# Patient Record
Sex: Female | Born: 1965 | Race: White | Hispanic: No | Marital: Married | Smoking: Never smoker
Health system: Southern US, Community
[De-identification: ages and names within clinical notes are randomized; demographics above are authoritative.]

## PROBLEM LIST (undated history)

## (undated) DIAGNOSIS — M1990 Unspecified osteoarthritis, unspecified site: Secondary | ICD-10-CM

## (undated) DIAGNOSIS — K219 Gastro-esophageal reflux disease without esophagitis: Secondary | ICD-10-CM

## (undated) DIAGNOSIS — M810 Age-related osteoporosis without current pathological fracture: Secondary | ICD-10-CM

## (undated) DIAGNOSIS — K589 Irritable bowel syndrome without diarrhea: Secondary | ICD-10-CM

## (undated) DIAGNOSIS — E079 Disorder of thyroid, unspecified: Secondary | ICD-10-CM

## (undated) DIAGNOSIS — T7840XA Allergy, unspecified, initial encounter: Secondary | ICD-10-CM

## (undated) HISTORY — DX: Allergy, unspecified, initial encounter: T78.40XA

## (undated) HISTORY — DX: Unspecified osteoarthritis, unspecified site: M19.90

## (undated) HISTORY — DX: Gastro-esophageal reflux disease without esophagitis: K21.9

## (undated) HISTORY — PX: COLONOSCOPY: SHX174

## (undated) HISTORY — DX: Disorder of thyroid, unspecified: E07.9

## (undated) HISTORY — DX: Age-related osteoporosis without current pathological fracture: M81.0

## (undated) HISTORY — PX: AUGMENTATION MAMMAPLASTY: SUR837

## (undated) HISTORY — DX: Irritable bowel syndrome, unspecified: K58.9

## (undated) HISTORY — PX: UPPER GASTROINTESTINAL ENDOSCOPY: SHX188

---

## 1998-11-13 ENCOUNTER — Other Ambulatory Visit: Admission: RE | Admit: 1998-11-13 | Discharge: 1998-11-13 | Payer: Self-pay | Admitting: Obstetrics and Gynecology

## 1999-09-15 ENCOUNTER — Other Ambulatory Visit: Admission: RE | Admit: 1999-09-15 | Discharge: 1999-09-15 | Payer: Self-pay | Admitting: Obstetrics and Gynecology

## 1999-10-16 ENCOUNTER — Ambulatory Visit (HOSPITAL_COMMUNITY): Admission: AD | Admit: 1999-10-16 | Discharge: 1999-10-16 | Payer: Self-pay | Admitting: Obstetrics and Gynecology

## 1999-10-16 ENCOUNTER — Encounter (INDEPENDENT_AMBULATORY_CARE_PROVIDER_SITE_OTHER): Payer: Self-pay

## 2000-11-03 ENCOUNTER — Other Ambulatory Visit: Admission: RE | Admit: 2000-11-03 | Discharge: 2000-11-03 | Payer: Self-pay | Admitting: Obstetrics and Gynecology

## 2001-10-11 HISTORY — PX: KNEE SURGERY: SHX244

## 2002-03-12 ENCOUNTER — Other Ambulatory Visit: Admission: RE | Admit: 2002-03-12 | Discharge: 2002-03-12 | Payer: Self-pay | Admitting: Obstetrics and Gynecology

## 2003-06-24 ENCOUNTER — Inpatient Hospital Stay (HOSPITAL_COMMUNITY): Admission: AD | Admit: 2003-06-24 | Discharge: 2003-06-28 | Payer: Self-pay | Admitting: Obstetrics and Gynecology

## 2003-06-29 ENCOUNTER — Encounter: Admission: RE | Admit: 2003-06-29 | Discharge: 2003-07-29 | Payer: Self-pay | Admitting: Obstetrics and Gynecology

## 2003-07-29 ENCOUNTER — Other Ambulatory Visit: Admission: RE | Admit: 2003-07-29 | Discharge: 2003-07-29 | Payer: Self-pay | Admitting: Obstetrics & Gynecology

## 2004-08-12 ENCOUNTER — Other Ambulatory Visit: Admission: RE | Admit: 2004-08-12 | Discharge: 2004-08-12 | Payer: Self-pay | Admitting: Obstetrics and Gynecology

## 2004-11-11 HISTORY — PX: AUGMENTATION MAMMAPLASTY: SUR837

## 2005-08-16 ENCOUNTER — Ambulatory Visit: Payer: Self-pay | Admitting: Family Medicine

## 2005-09-27 ENCOUNTER — Other Ambulatory Visit: Admission: RE | Admit: 2005-09-27 | Discharge: 2005-09-27 | Payer: Self-pay | Admitting: Obstetrics and Gynecology

## 2005-11-09 ENCOUNTER — Ambulatory Visit: Payer: Self-pay | Admitting: Internal Medicine

## 2008-03-15 ENCOUNTER — Encounter: Payer: Self-pay | Admitting: Internal Medicine

## 2008-04-10 ENCOUNTER — Ambulatory Visit: Payer: Self-pay | Admitting: Internal Medicine

## 2008-04-10 DIAGNOSIS — R1013 Epigastric pain: Secondary | ICD-10-CM | POA: Insufficient documentation

## 2008-04-10 DIAGNOSIS — K219 Gastro-esophageal reflux disease without esophagitis: Secondary | ICD-10-CM | POA: Insufficient documentation

## 2008-04-26 ENCOUNTER — Encounter: Payer: Self-pay | Admitting: Internal Medicine

## 2008-04-26 ENCOUNTER — Ambulatory Visit: Payer: Self-pay | Admitting: Internal Medicine

## 2008-05-02 ENCOUNTER — Encounter: Payer: Self-pay | Admitting: Internal Medicine

## 2008-10-11 HISTORY — PX: SHOULDER SURGERY: SHX246

## 2008-10-30 ENCOUNTER — Encounter: Admission: RE | Admit: 2008-10-30 | Discharge: 2008-10-30 | Payer: Self-pay | Admitting: Obstetrics and Gynecology

## 2009-01-02 ENCOUNTER — Encounter: Admission: RE | Admit: 2009-01-02 | Discharge: 2009-04-02 | Payer: Self-pay | Admitting: Orthopedic Surgery

## 2009-04-03 ENCOUNTER — Encounter: Admission: RE | Admit: 2009-04-03 | Discharge: 2009-04-21 | Payer: Self-pay | Admitting: Orthopedic Surgery

## 2009-11-18 ENCOUNTER — Encounter: Admission: RE | Admit: 2009-11-18 | Discharge: 2009-11-18 | Payer: Self-pay | Admitting: Obstetrics and Gynecology

## 2010-04-20 ENCOUNTER — Encounter (INDEPENDENT_AMBULATORY_CARE_PROVIDER_SITE_OTHER): Payer: Self-pay | Admitting: Family Medicine

## 2010-04-20 ENCOUNTER — Ambulatory Visit (HOSPITAL_COMMUNITY): Admission: RE | Admit: 2010-04-20 | Discharge: 2010-04-20 | Payer: Self-pay | Admitting: Family Medicine

## 2010-04-20 ENCOUNTER — Ambulatory Visit: Payer: Self-pay | Admitting: Cardiology

## 2010-04-20 ENCOUNTER — Ambulatory Visit: Payer: Self-pay

## 2010-06-29 ENCOUNTER — Encounter
Admission: RE | Admit: 2010-06-29 | Discharge: 2010-08-05 | Payer: Self-pay | Source: Home / Self Care | Admitting: Sports Medicine

## 2010-11-02 ENCOUNTER — Other Ambulatory Visit: Payer: Self-pay | Admitting: Obstetrics and Gynecology

## 2010-11-02 DIAGNOSIS — Z1239 Encounter for other screening for malignant neoplasm of breast: Secondary | ICD-10-CM

## 2010-11-02 DIAGNOSIS — Z1231 Encounter for screening mammogram for malignant neoplasm of breast: Secondary | ICD-10-CM

## 2010-11-11 LAB — HM MAMMOGRAPHY

## 2010-11-12 NOTE — Consult Note (Signed)
Summary: GERD, EPIGASTRIC PAIN   History of Present Illness Visit Type: consult Primary GI MD: Stan Head MD [facility] Primary Provider: Candis Schatz Requesting Provider: Candis Schatz Chief Complaint: GERD also having Epigastic pain History of Present Illness:   45 year-old white woman with at least several year history of intermittent epigastric pain and abdominal distention which occurs in the setting of chronic Nexium use to control GERD. The Nexium works well.  A few years ago she was having some abdominal pain and diarrhea, Levsin SL was tried without much success but those symptoms resolved spontaneously. She now describes spells of disabling upper abdominal pain associated with abdominal bloating and distention, then after 1-6 hours gets relief. Sometimes it dissipates without any changes and sometimes there is a fair amount of flatus. No nausea or vomiting to speak of. The spells occur 2-4x a month. SHe cannot pinpoint any particular foods but thinks sugar alcohols like sorbitol cause trouble.  Problems atre typically daytime troubles and not nocturnal.  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 Her 51/2 yo daughter is a source of stress also. Her problems started before C section in 2004.  Dr. Timothy Lasso has checked celiac Ab's and are negative, recent abdominal ultrasound is normal, TSH and T4 are normal. Records reviewed.   All other GI ROS negative.            [place] of Abdomen  Procedure date:  04/04/2008  Findings:      normal:   [facility]     Prior Medications Reviewed Using: Medication Bottles  Updated Prior Medication List: PROPYLTHIOURACIL 50 MG  TABS (PROPYLTHIOURACIL) 1/2 every other day NEXIUM 40 MG  PACK (ESOMEPRAZOLE MAGNESIUM) 1 capsule every day ZOVIA 1/35E (28) 1-35 MG-MCG  TABS (ETHYNODIOL DIAC-ETH ESTRADIOL) 1 tablet by mouth every day  Current Allergies: No known allergies   Past Medical  History:    IBS    Allergic Rhinitis    GERD    Hyperthyroidism  Past Surgical History:    Cesarean Section 2004   Family History:    Family History of Colon Cancer: maternal randfather    Family History of Diabetes: grandfather, mother, father    Family History of Heart Disease: father (deceased)  Social History:    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     Daily Caffeine Use 0-1/day    Illicit Drug Use - no    1 daughter    Married   Risk Factors:  Tobacco use:  never Drug use:  no   Review of Systems       The patient complains of allergy/sinus.         All other ROS negative   Vital Signs:  Patient Profile:   45 Years Old Female Height:     62 inches Weight:      117 pounds BMI:     21.48 Pulse rate:   64 / minute Pulse rhythm:   regular BP sitting:   90 / 66  (right arm)  Vitals Entered By: Merri Ray CMA (April 10, 2008 2:02 PM)                  Physical Exam  General:     Well developed, well nourished, no acute distress. Head:     Normocephalic and atraumatic. Eyes:     PERRLA, no icterus. Mouth:     No deformity or  lesions, dentition normal. Neck:     Supple; no masses or thyromegaly. Lungs:     Clear throughout to auscultation. Heart:     Regular rate and rhythm; no murmurs, rubs,  or bruits. Abdomen:     Soft, nontender and nondistended. No masses, hepatosplenomegaly or hernias noted. Normal bowel sounds. Extremities:     No clubbing, cyanosis, edema or deformities noted. Neurologic:     Alert and  oriented x4;  grossly normal neurologically. Cervical Nodes:     No significant cervical or supraclavicular adenopathy.  Psych:     Alert and cooperative. Normal mood and affect.    Impression & Recommendations:  Problem # 1:  ABDOMINAL PAIN-EPIGASTRIC (ICD-789.06) Assessment: Comment Only Episodic with disabling effects. Sounds functional, ? if related to Nexium. Started before C-sxn so  doubt adhesions. Celiac Ab neg 4/07 but gluten-intolerance still a possibility. Korea negative and does not sound biliary. ? food or sugar intolerance.  After EGD (if negative) consider antispasmodic, probiotic and different PPI. Could need small bowel evaluaton and will consider duodenal biopsies at EGD (? occult celiac disease).  Orders: EGD (EGD) Risks, benefits,and indications of endoscopic procedure(s) were reviewed with the patient and all questions answered.    Problem # 2:  GERD (ICD-530.81) Assessment: Comment Only Ok on Nexium.   Patient Instructions: 1)  [facility] Patient Information Guide given to patient.    ]

## 2010-11-12 NOTE — Letter (Signed)
Summary: Patient [facility] Biopsy Results  [facility]  [address] [place], [place] [address]   Phone: [phone]  Fax: [phone]        May 02, 2008 MRN: [id number]    Melanie Taylor [address] [place], [place]  [address]    Dear Ms. Marcou,  I am pleased to inform you that the biopsies taken during your recent endoscopic examination demonstrated that you have some benign stomach polyps that do not need follow-up. I do not think they are related to your symptoms.  Please continue with the treatment plan as outlined on the day of your    exam.   Please call us if you are having persistent problems or have questions about your condition that have not been fully answered at this time.  Sincerely,  Iva Boop MD  This letter has been electronically signed by your physician.

## 2010-11-12 NOTE — Procedures (Signed)
Summary: EGD   EGD  Procedure date:  04/26/2008  Findings:      Location: [facility]    Patient Name: Melanie Taylor, Melanie Taylor MRN: [id number] Procedure Procedures: Panendoscopy (EGD) CPT: 43235.  Personnel: Endoscopist: Iva Boop, MD, [facility].  Referred By: Creola Corn, MD.  Exam Location: Exam performed in Outpatient Clinic. Outpatient  Patient Consent: Procedure, Alternatives, Risks and Benefits discussed, consent obtained, from patient. Consent was obtained by the RN.  Indications Symptoms: Abdominal pain, location: epigastric.  Comments: INTERMITTENT, DISABLING EPIGASTRIC PAIN. ABD Korea NORMAL, CELIAC ABS NEG, IS ON NEXIUM History  Current Medications: Patient is not currently taking Coumadin.  Allergies: No known allergies.  Pre-Exam Physical: Performed Apr 26, 2008  Cardio-pulmonary exam, HEENT exam, Abdominal exam, Mental status exam WNL.  Comments: Pt. history reviewed/updated, physical exam performed prior to initiation of sedation? YES Exam Exam Info: Maximum depth of insertion Duodenum, intended Duodenum. Patient position: on left side. Gastric retroflexion performed. ASA Classification: II. Tolerance: excellent.  Sedation Meds: Patient assessed and found to be appropriate for moderate (conscious) sedation. Fentanyl 50 mcg. given IV. Versed 5 mg. given IV. Cetacaine Spray 2 sprays given aerosolized.  Monitoring: BP and pulse monitoring done. Oximetry used. Supplemental O2 given  Findings - Normal: Proximal Esophagus to Distal Esophagus. Comments: z-line at 35 cm.  - MUCOSAL ABNORMALITY: Cardia to Body. Biopsy/Mucosal Abn taken. Comment: MULTIPLE SOFT, FLESHY POLYPS UP TO 8 MM IN SIZE.  - Normal: Body to Duodenal 2nd Portion. Comments: DUODENAL MUCOSA OK, MO BXS TAKEN GIVEN NEG CELIAC AB'S.   Assessment  Comments: 1) PROXIMAL GASTRIC POLYPS THAT LOOK LIKE BENIGN FUNDIC GLAND POLYPS 2) OTHERWISE NORMAL EGD  ETIOLOGY OF PAIN SOUNDS MOST  LIKE IBS, ? TRANSIENT OBSTRUCTIONS Events  Unplanned Intervention: No unplanned interventions were required.  Plans Comments: 1) DICYCLOMINE 20 MG EVERY 6 HRS PRN ABDOMINAL PAIN  2) TRY TO KEEP A FOOD DIARY TO SEE WHAT TRIGGERS PAIN  Disposition: After procedure patient sent to recovery. After recovery patient sent home.  Scheduling: Path Letter, to The Patient,   Comments: IF DICYCLOMINE IS UNHELPFUL PLEASE SCHEDULE A RETURN VISIT WITH DR. Leone Payor IF YOU GET ANOTHER ATTACK WITH PAIN AND ABDOMINAL SWELLING PLEASE HAVE AN ABDOMINAL XRAY DONE (RX GIVEN FOR THIS)  cc:  Creola Corn, MD  REPORT OF SURGICAL PATHOLOGY   Case #: [id number] Patient Name: Melanie, [name] Office Chart Number:  [id number]   MRN: [id number] Pathologist: Alden Server A. Delila Spence, MD DOB/Age  04/06/1966 (Age: 66)    Gender: F Date Taken:  04/26/2008 Date Received: 04/29/2008   FINAL DIAGNOSIS   ***MICROSCOPIC EXAMINATION AND DIAGNOSIS***   STOMACH, POLYPS, BIOPSY:   - BENIGN FUNDIC GLAND POLYPS.  - NO ADENOMATOUS CHANGE OR MALIGNANCY IDENTIFIED.    COMMENT A Warthin-Starry stain is performed to determine the possibility of the presence of Helicobacter pylori. The Warthin-Starry stain is negative for organisms of Helicobacter pylori. The control(s) stained appropriately. (EAA:mj 04/30/08)   mj Date Reported:  04/30/2008     Lyn Hollingshead. Delila Spence, MD *** Electronically Signed Out By EAA ***   Clinical information GERD, abd pain   R/O adenoma (mw)   specimen(s) obtained Stomach, polyp(s)   Gross Description Received in formalin are tan, soft tissue fragments that are submitted in toto.  Number:  2            Size:  Each 0.4 cm.    (GP:cdc 04/29/08)            cc/  Signed by Iva Boop MD on 05/02/2008 at 3:26 PM  ________________________________________________________________________ NO RECALL   Signed by Iva Boop MD on 05/02/2008 at 3:31 PM   ________________________________________________________________________   May 02, 2008 MRN: [id number]    [facility] [address] [place], [place]  [address]    Dear Ms. Loberg,  I am pleased to inform you that the biopsies taken during your recent endoscopic examination demonstrated that you have some benign stomach polyps that do not need follow-up. I do not think they are related to your symptoms.  Please continue with the treatment plan as outlined on the day of your    exam.   Please call us if you are having persistent problems or have questions about your condition that have not been fully answered at this time.  Sincerely,  Iva Boop MD  This letter has been electronically signed by your physician.   Signed by Iva Boop MD on 05/02/2008 at 3:33 PM May 02, 2008 MRN: [id number]    Melanie Taylor [address] [place], [place]  [address]    Dear Ms. Westerman,  I am pleased to inform you that the biopsies taken during your recent endoscopic examination demonstrated that you have some benign stomach polyps that do not need follow-up. I do not think they are related to your symptoms.  Please continue with the treatment plan as outlined on the day of your    exam.   Please call us if you are having persistent problems or have questions about your condition that have not been fully answered at this time.  Sincerely,  Iva Boop MD  This letter has been electronically signed by your physician.   Signed by Iva Boop MD on 05/02/2008 at 3:33 PM \  This report was created from the original endoscopy report, which was reviewed and signed by the above listed endoscopist.

## 2010-11-12 NOTE — Progress Notes (Signed)
Summary: ov note/Dr. Nancie Neas note/   Imported By: Lester [place] 04/17/2008 10:05:27  _____________________________________________________________________  External Attachment:    Type:   Image     Comment:   External Document

## 2010-11-12 NOTE — Miscellaneous (Signed)
Summary: dicyclomine rx.  Clinical Lists Changes  Medications: Added new medication of DICYCLOMINE HCL 20 MG  TABS (DICYCLOMINE HCL) dicyclomine 20mg  every 6 hrs as needed abdominal pain. - Signed Rx of DICYCLOMINE HCL 20 MG  TABS (DICYCLOMINE HCL) dicyclomine 20mg  every 6 hrs as needed abdominal pain.;  #60 x 5;  Signed;  Entered by: Darlyn Read RN;  Authorized by: Iva Boop MD;  Method used: Electronic    Prescriptions: DICYCLOMINE HCL 20 MG  TABS (DICYCLOMINE HCL) dicyclomine 20mg  every 6 hrs as needed abdominal pain.  #60 x 5   Entered by:   Darlyn Read RN   Authorized by:   Iva Boop MD   Signed by:   Darlyn Read RN on 04/26/2008   Method used:   Electronically sent to ...       [facility] [phone]*       [address]       [place], [place]  [address]       Ph: [phone]       Fax: [phone]   RxID:   [id number]

## 2010-11-20 ENCOUNTER — Ambulatory Visit
Admission: RE | Admit: 2010-11-20 | Discharge: 2010-11-20 | Disposition: A | Discharge status: Home / Self Care | Payer: BC Managed Care – PPO | Source: Ambulatory Visit | Attending: Obstetrics and Gynecology | Admitting: Obstetrics and Gynecology

## 2010-11-20 DIAGNOSIS — Z1231 Encounter for screening mammogram for malignant neoplasm of breast: Secondary | ICD-10-CM

## 2010-12-15 ENCOUNTER — Other Ambulatory Visit: Payer: Self-pay | Admitting: Obstetrics and Gynecology

## 2011-02-26 NOTE — Op Note (Signed)
[facility]  Patient:    Melanie Taylor                      MRN: [id number] Proc. Date: 10/15/98 Adm. Date:  [id number] Attending:  Mickle Mallory                           Operative Report  PREOPERATIVE DIAGNOSIS:       Inevitable miscarriage, blood type O positive.  POSTOPERATIVE DIAGNOSIS:      Inevitable miscarriage, blood type O positive.  OPERATION:                    Suction D&C for evacuation of inevitable miscarriage.  SURGEON:                      Gerrit Friends. Aldona Bar, M.D.  ASSISTANT:  ANESTHESIA:                   Intravenous conscious sedation and 1% Xylocaine without epinephrine paracervical block.  ESTIMATED BLOOD LOSS:  INDICATIONS:                  This 45 year old primigravida was seen in the office on January 4, and at that time should have been [redacted] weeks pregnant, however, because she presented with some spotting and lower back pain, she underwent a vaginal ultrasound which revealed the findings consistent with a fetal pole measuring approximately eight weeks size with no fetal heart tones.  This was confirmed by a second ultrasound.  Diagnosis of an inevitable miscarriage was made and after discussion with the patient and her mother, the decision was made to proceed with evacuation by D&C at this time.  Medically, she has hyperthyroidism and is currently maintained on PTU 50 mg a day in divided doses and followed by Fritzi Mandes, M.D.  DESCRIPTION OF PROCEDURE:     The patient was taken to the operating room where  after the satisfactory induction of intravenous conscious sedation, she was prepped and draped having been placed in the modified lithotomy position in the short Allen stirrups.  She was prdrd and the bladder was drained of clear urine with a red rubber catheter in an in-and-out fashion.  At this time a speculum was placed and a tenaculum placed on the anterior lip of the cervix.  There was some  vaginal bleeding noted.  The cervix, however, was closed.  At this time, a paracervical  block was carried out using 12 cc of 1% Xylocaine without epinephrine and thereafter with minimal difficulty the internal os was dilated to a #27 Pratt dilator.  Using the #9 suction curet, the cavity was then thoroughly, gently, and systematically evacuated of all products of conception.  This was confirmed afterwards using a medium standard curet.  Bleeding was controlled with slowly given intravenous Pitocin and manual stimulation of the uterus.  Further curettage with a #8 suction curet produced no further tissue and the procedure was felt to be complete.  At this time the patient was transferred to the recovery room in satisfactory condition after examination revealed a uterus that was upper limits of normal size and firm.  The patient will be discharged to home.  DISCHARGE MEDICATIONS:        1. Doxycycline 100 mg twice daily for a total of our  days to avoid infection.                               2. Anaprox DS one every eight hours as needed for                                  cramping.  FOLLOW-UP:                    She will return to the office for follow-up in approximately one weeks time and will be given a detailed instruction sheet at he time of discharge.  In summary, this patient presented with an inevitable miscarriage and was taken to the operating room for completion of the miscarriage by [facility] which went well. Pathologic specimen consisted of products of conception. DD:  10/16/99 TD:  10/17/99 Job: [id number]

## 2011-02-26 NOTE — Discharge Summary (Signed)
   Melanie Taylor, Melanie Taylor                           ACCOUNT NO.:  [id number]   MEDICAL RECORD NO.:  [id number]                   PATIENT TYPE:  INP   LOCATION:  9126                                 FACILITY:  [facility]   PHYSICIAN:  Carrington Clamp, M.D.              DATE OF BIRTH:  [birth year]   DATE OF ADMISSION:  06/24/2003  DATE OF DISCHARGE:  06/28/2003                                 DISCHARGE SUMMARY   ADMITTING DIAGNOSIS:  Post term at 42 weeks.   DISCHARGE DIAGNOSES:  1. Post term at 42 weeks.  2. Arrest of active phase.   PERTINENT PROCEDURES PERFORMED:  Primary low transverse cesarean section.   PERTINENT LABORATORY RESULTS:  Discharge hemoglobin 8.7 and 25.2.   HISTORY AND PHYSICAL:  Please refer to written History and Physical on the  chart.  Briefly, this is a 45 year old G2 P0 who is admitted at 42 weeks for  induction.  The patient underwent induction with Pitocin and progressed to  2, 60, and -2.  However, the patient did not progress further than that and  on June 25, 2003 at 1 a.m. a primary low transverse cesarean section  was performed.  The patient tolerated the procedure well and by  postoperative day #3 was eating, ambulating, voiding, and afebrile.  She was  discharged with the following:  1. Activity:  Routine.  2. Diet:  Routine.  3. Medications:  Percocet 5 mg one p.o. q.4-6h. p.r.n. pain and iron.  4. Instructions:  Routine.   Staples had been removed on postoperative day #3.  The mother was discharged  on June 28, 2003 with female infant to home.                                               Carrington Clamp, M.D.    MH/MEDQ  D:  07/17/2003  T:  07/17/2003  Job:  [id number]

## 2011-02-26 NOTE — Op Note (Signed)
Melanie Taylor, Melanie Taylor                           ACCOUNT NO.:  [id number]   MEDICAL RECORD NO.:  [id number]                   PATIENT TYPE:  INP   LOCATION:  9166                                 FACILITY:  [facility]   PHYSICIAN:  Gerrit Friends. Aldona Bar, M.D.                DATE OF BIRTH:  23-Sep-1966   DATE OF PROCEDURE:  06/25/2003  DATE OF DISCHARGE:                                 OPERATIVE REPORT   PREOPERATIVE DIAGNOSES:  1. Failure to progress.  2. Forty-two week intrauterine pregnancy.   POSTOPERATIVE DIAGNOSES:  1. Failure to progress.  2. Forty-two week intrauterine pregnancy.  3. Delivery of 8 pound 14 ounce female infant, Apgars 8 and 9.   PROCEDURE:  Primary low transverse cesarean section.   SURGEON:  Gerrit Friends. Aldona Bar, M.D.   ANESTHESIA:  Epidural, Donald T. Pamalee Leyden, M.D.   HISTORY:  This gravida 2, para 0, was admitted in early labor on the morning  of June 24, 2003, at almost 42 weeks' gestation.  Her cervix at the  time of admission revealed a closed internal os with the external os  fingertip and the vertex at -2 station.  She was admitted and placed on  Pitocin and did change her cervix by 3:30 p.m.  At that time she was 1 cm  dilated, 50-60% effaced, with the vertex at -2 station.  Amniotomy was  carried out with production of clear fluid and an IUPC was placed.  Pitocin  was continued.  There was minimal change in the cervix up until about 8:40  p.m.  At 8:40 p.m. she was 2 cm, 60% effaced, with the vertex at -2 to -1  station, was in a better contraction pattern, and fetal heart was reactive.  An epidural had already been placed and the patient was comfortable.  At  11:15 p.m. the cervix had not changed any more, and indeed she had had some  decelerations earlier and when seen at 11:15 p.m. was already on oxygen with  a fetal scalp electrode placed.  There was decreased variability noted  following the decelerations which were noted earlier.  The patient was  reassessed approximately an hour later with no further change in her cervix.  A decision was made to proceed with a primary low transverse cesarean  section because of failure to progress and the previous nonreassuring  tracing.   DESCRIPTION OF PROCEDURE:  The patient was transported to the operating  room, where after the satisfactory augmentation of her epidural anesthetic  she was prepped and draped and placed in the supine position, slightly  tilted to the left.  After she was adequately draped and good anesthetic  levels were documented, the procedure was begun.   A Pfannenstiel incision was made with minimal difficulty and dissected down  sharply to and through the fascia in a low transverse fashion with  hemostasis created at each layer.  The subfascial space was developed  inferiorly and superiorly and muscles separated in the midline and the  peritoneum identified and entered appropriately with care taken to avoid the  bowel superiorly and the bladder inferiorly.  At this time the vesicouterine  peritoneum was identified and opened in a low transverse fashion and  thereafter using the Metzenbaum scissors, a low transverse incision was made  and extended laterally with the fingers.  Thereafter with minimal  difficulty, delivery of a viable infant which cried spontaneously at once  was carried out from a vertex position.  There was a body cord noted.   After the cord was clamped and cut, the infant was passed off to the  awaiting team.  The infant was found to weigh 8 pounds 14 ounces, had Apgars  of 8 and 9, and was taken to the nursery in good condition.   After the cord bloods were collected, the placenta was delivered intact.  The uterus was then exteriorized and rendered free of any remaining products  of conception.  Good uterine contractility was afforded with slowly-given  intravenous Pitocin and manual stimulation.  At this time closure of the  uterine incision was  carried out using a single layer of #1 Vicryl in a  running locked fashion.  This was reinforced with several figure-of-eight #1  Vicryls.  The tubes and ovaries were inspected and noted to be normal.  The  uterine incision was dry.  At this time the uterus was replaced into the  abdominal cavity, the abdomen lavaged of all free blood and clot, and after  all counts were noted to be correct and no foreign bodies were noted to be  remaining in the abdominal cavity, closure of the abdomen was done in  layers.  The abdominal peritoneum was closed with 3-0 Vicryl in a running  fashion and the muscle secured with the same.  Assured of good subfascial  hemostasis, the fascia was then reapproximated with 0 Vicryl from angle to  midline bilaterally.  The subcu tissue was then rendered hemostatic and  staples were then used to close the skin.  A sterile pressure dressing was  applied and the patient at this time was transported to the recovery room in  satisfactory condition, having tolerated the procedure well.  Estimated  blood loss 500 mL.  All counts correct x2.  In summary, this patient after a  prolonged attempt to get her into a better labor pattern did not progress  and was taken to the operating room for failure to progress at 42 weeks'  gestation with a previous nonreassuring tracing, for a primary low  transverse cesarean section.  She was delivered of an 8 pound 14 ounce  female infant with good Apgars.  At the conclusion of the procedure both  mother and baby were doing well in their respective recovery areas.                                               Gerrit Friends. Aldona Bar, M.D.    RMW/MEDQ  D:  06/25/2003  T:  06/25/2003  Job:  [id number]

## 2011-04-11 LAB — HM COLONOSCOPY

## 2011-05-24 ENCOUNTER — Ambulatory Visit (INDEPENDENT_AMBULATORY_CARE_PROVIDER_SITE_OTHER): Payer: BC Managed Care – PPO | Admitting: Family Medicine

## 2011-05-24 ENCOUNTER — Telehealth: Payer: Self-pay

## 2011-05-24 ENCOUNTER — Encounter: Payer: Self-pay | Admitting: Family Medicine

## 2011-05-24 DIAGNOSIS — E0590 Thyrotoxicosis, unspecified without thyrotoxic crisis or storm: Secondary | ICD-10-CM

## 2011-05-24 DIAGNOSIS — F411 Generalized anxiety disorder: Secondary | ICD-10-CM

## 2011-05-24 DIAGNOSIS — R5383 Other fatigue: Secondary | ICD-10-CM | POA: Insufficient documentation

## 2011-05-24 DIAGNOSIS — R5381 Other malaise: Secondary | ICD-10-CM

## 2011-05-24 DIAGNOSIS — K219 Gastro-esophageal reflux disease without esophagitis: Secondary | ICD-10-CM

## 2011-05-24 DIAGNOSIS — R6882 Decreased libido: Secondary | ICD-10-CM | POA: Insufficient documentation

## 2011-05-24 DIAGNOSIS — F419 Anxiety disorder, unspecified: Secondary | ICD-10-CM | POA: Insufficient documentation

## 2011-05-24 LAB — CBC WITH DIFFERENTIAL/PLATELET
Basophils Absolute: 0.1 10*3/uL (ref 0.0–0.1)
Basophils Relative: 0.7 % (ref 0.0–3.0)
Eosinophils Absolute: 0 10*3/uL (ref 0.0–0.7)
Eosinophils Relative: 0.2 % (ref 0.0–5.0)
HCT: 39.5 % (ref 36.0–46.0)
Hemoglobin: 13.4 g/dL (ref 12.0–15.0)
Lymphocytes Relative: 20.3 % (ref 12.0–46.0)
Lymphs Abs: 1.6 10*3/uL (ref 0.7–4.0)
MCHC: 33.8 g/dL (ref 30.0–36.0)
MCV: 91.3 fl (ref 78.0–100.0)
Monocytes Absolute: 0.6 10*3/uL (ref 0.1–1.0)
Monocytes Relative: 7.3 % (ref 3.0–12.0)
Neutro Abs: 5.6 10*3/uL (ref 1.4–7.7)
Neutrophils Relative %: 71.5 % (ref 43.0–77.0)
Platelets: 294 10*3/uL (ref 150.0–400.0)
RBC: 4.33 Mil/uL (ref 3.87–5.11)
RDW: 13.9 % (ref 11.5–14.6)
WBC: 7.8 10*3/uL (ref 4.5–10.5)

## 2011-05-24 LAB — T4, FREE: Free T4: 0.85 ng/dL (ref 0.60–1.60)

## 2011-05-24 LAB — VITAMIN B12: Vitamin B-12: 328 pg/mL (ref 211–911)

## 2011-05-24 LAB — TSH: TSH: 1.01 u[IU]/mL (ref 0.35–5.50)

## 2011-05-24 LAB — T3, FREE: T3, Free: 2.5 pg/mL (ref 2.3–4.2)

## 2011-05-24 NOTE — Progress Notes (Signed)
  Subjective:    Patient ID: Melanie Taylor, female    DOB: 1966-06-21, 45 y.o.   MRN: [id number]  HPI New to establish, previous MD Blomgren  Hyperthyroid- chronic problem for pt, was seeing Endo but MD left and PCP has been managing meds.  Taking 1/2 tab PTU 50mg  every other day.  Due for labs.  'i'm at the other end of the spectrum now- i'm SO tired'.  No palpitations, sweats, hair loss.  GERD- chronic problem for pt, on Omeprazole daily w/ good results.  Concerned about recent studies about PPIs and bone loss given family hx of osteoporosis.  Anxiety- chronic problem, 'i'm very high strung, always have been'.  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  Uses xanax prn.  Decreased sex drive- spoke to previous MD and GYN.  Has been on SSRIs previously w/out relief, took hormone shots w/out benefit.  Has had sxs x2 yrs.  This is upsetting to her.  Had a sex drive previous to birth of daughter 8 yrs ago.  Fatigue- increased recently, has both work and home stressors.  Trying to exercise regularly- 1 hr daily.  'by mid afternoon it's like someone hit an off switch'.   Review of Systems For ROS see HPI     Objective:   Physical Exam  Vitals reviewed. Constitutional: She is oriented to person, place, and time. She appears well-developed and well-nourished. No distress.  HENT:  Head: Normocephalic and atraumatic.  Eyes: Conjunctivae and EOM are normal. Pupils are equal, round, and reactive to light.  Neck: Normal range of motion. Neck supple. No thyromegaly present.  Cardiovascular: Normal rate, regular rhythm, normal heart sounds and intact distal pulses.   No murmur heard. Pulmonary/Chest: Effort normal and breath sounds normal. No respiratory distress.  Abdominal: Soft. She exhibits no distension. There is no tenderness.  Musculoskeletal: She exhibits no edema.  Lymphadenopathy:    She has no cervical adenopathy.  Neurological: She is alert and  oriented to person, place, and time.  Skin: Skin is warm and dry.  Psychiatric: She has a normal mood and affect. Her behavior is normal. Judgment and thought content normal.          Assessment & Plan:

## 2011-05-24 NOTE — Telephone Encounter (Signed)
Labs mailed

## 2011-05-24 NOTE — Telephone Encounter (Signed)
Message copied by Beverely Low on Mon May 24, 2011  5:00 PM ------      Message from: Sheliah Hatch      Created: Mon May 24, 2011  4:44 PM       Labs all normal.  Waiting on Vit D.  Continue current dose of PTU

## 2011-05-24 NOTE — Patient Instructions (Signed)
We'll notify you of your lab results and adjust meds as needed If the labs look good- we'll refer you to endocrinology to discuss your low drive Start a daily multivitamin and 2 Caltrate + D (600 of Calcium + 200 of D) Call with any questions or concerns Hang in there!!

## 2011-05-26 ENCOUNTER — Other Ambulatory Visit: Payer: Self-pay

## 2011-05-26 ENCOUNTER — Telehealth: Payer: Self-pay

## 2011-05-26 LAB — VITAMIN D 1,25 DIHYDROXY
Vitamin D 1, 25 (OH)2 Total: 94 pg/mL — ABNORMAL HIGH (ref 18–72)
Vitamin D2 1, 25 (OH)2: <8 pg/mL
Vitamin D3 1, 25 (OH)2: 94 pg/mL

## 2011-05-26 NOTE — Telephone Encounter (Signed)
Message copied by Beverely Low on Wed May 26, 2011  1:58 PM ------      Message from: Sheliah Hatch      Created: Wed May 26, 2011  1:01 PM       Vit D level is elevated.  Should stop Vit D supplements at this time.

## 2011-05-26 NOTE — Telephone Encounter (Signed)
Left message to notify pt of lab results. Advised pt to call with questions or concerns

## 2011-05-28 ENCOUNTER — Telehealth: Payer: Self-pay

## 2011-05-28 ENCOUNTER — Other Ambulatory Visit: Payer: Self-pay | Admitting: Family Medicine

## 2011-05-28 MED ORDER — PROPYLTHIOURACIL 50 MG PO TABS: ORAL_TABLET | ORAL | Status: DC

## 2011-05-28 NOTE — Telephone Encounter (Signed)
Pt left a message about her lab results; when pt was called back she had received her lab work in the mail and her questions had been answered.  Pt states she would like Dr. Beverely Low to fill her medications.  Pt states she is out of thyroid medication and PTU.  Pt states her previous doctor only gave her one refill and she is about to run out in a few days.  Pt's pharmacy will send over a request for medications.

## 2011-05-28 NOTE — Telephone Encounter (Signed)
Ok to refill PTU 50mg - 1/2 tab every other day, #15, 3 refills

## 2011-06-01 ENCOUNTER — Other Ambulatory Visit: Payer: Self-pay | Admitting: Family Medicine

## 2011-06-01 MED ORDER — ALPRAZOLAM 0.5 MG PO TABS: 0.5000 mg | ORAL_TABLET | Freq: Three times a day (TID) | ORAL | Status: DC | PRN

## 2011-06-01 NOTE — Telephone Encounter (Signed)
Done

## 2011-06-01 NOTE — Assessment & Plan Note (Signed)
May be related to pt's anxiety but given use of PTH will check labs to determine if pt's thyroid is too suppressed.  Reviewed supportive care and red flags that should prompt return.  Pt expressed understanding and is in agreement w/ plan.

## 2011-06-01 NOTE — Telephone Encounter (Signed)
Refill for what medication?

## 2011-06-01 NOTE — Assessment & Plan Note (Signed)
Pt reports this has been problematic for 8 yrs.  Has discussed this w/ GYN and tried multiple tx's previously.  Will refer to endo to assess if there is a hormonal problem.  Pt expressed understanding and is in agreement w/ plan.

## 2011-06-01 NOTE — Assessment & Plan Note (Signed)
Due for labs.  Will adjust meds prn.

## 2011-06-01 NOTE — Assessment & Plan Note (Signed)
sxs currently well controlled.  No changes.

## 2011-06-01 NOTE — Telephone Encounter (Signed)
OV 05/24/11

## 2011-06-01 NOTE — Telephone Encounter (Signed)
Ok for #30, no refills 

## 2011-06-01 NOTE — Assessment & Plan Note (Signed)
Pt reports sxs are fairly well controlled on xanax prn.  Discussed starting controller med but will hold on SSRI at this time as pt is struggling w/ low libido.  Will follow.

## 2011-07-19 ENCOUNTER — Other Ambulatory Visit: Payer: Self-pay | Admitting: Family Medicine

## 2011-08-10 ENCOUNTER — Encounter: Payer: Self-pay | Admitting: Internal Medicine

## 2011-08-10 ENCOUNTER — Encounter: Payer: Self-pay | Admitting: Family Medicine

## 2011-08-10 ENCOUNTER — Ambulatory Visit (INDEPENDENT_AMBULATORY_CARE_PROVIDER_SITE_OTHER): Payer: BC Managed Care – PPO | Admitting: Internal Medicine

## 2011-08-10 VITALS — BP 118/74 | HR 76 | Temp 98.6°F | Resp 18 | Ht 63.0 in | Wt 119.0 lb

## 2011-08-10 DIAGNOSIS — J069 Acute upper respiratory infection, unspecified: Secondary | ICD-10-CM

## 2011-08-10 MED ORDER — AZITHROMYCIN 250 MG PO TABS: ORAL_TABLET | ORAL | Status: AC

## 2011-08-10 NOTE — Progress Notes (Signed)
  Subjective:    Patient ID: Melanie Taylor, female    DOB: [birth year], 45 y.o.   MRN: [id number]  HPI 5 days history of sore throat, postnasal dripping and dry cough. Her allergies are well controlled with daily Flonase and Zyrtec. She also has some chills and a low-grade fever with the onset of symptoms. Reports that her previous allergist  Dr. Corinda Gubler usually gives her prednisone and sometimes Zithromax. Patient is concerned because she has a flight to catch next week.  PMH: reviewed PSH: reviewed   Review of Systems No nasal discharge Throat is slightly congested, no chest congestion or wheezing. No itchy eyes, mild itchy nose. She was seen by Dr. Lisabeth Devoid , they discontinue PTU about a month ago.     Objective:   Physical Exam  Constitutional: She is oriented to person, place, and time. She appears well-developed and well-nourished. No distress.  HENT:  Head: Normocephalic and atraumatic.  Right Ear: External ear normal.  Left Ear: External ear normal.       Nose is slightly congested, face symmetric, mild tenderness symmetrically at the maxillary sinuses.   Cardiovascular: Regular rhythm and normal heart sounds.   No murmur heard. Pulmonary/Chest: Effort normal and breath sounds normal. No respiratory distress. She has no wheezes. She has no rales.  Neurological: She is alert and oriented to person, place, and time.  Skin: She is not diaphoretic.          Assessment & Plan:  URI: Symptoms most likely due to a URI, conservative treatment, if not better will start a Z-Pak. See instructions.

## 2011-08-10 NOTE — Patient Instructions (Signed)
Rest, fluids , tylenol If  cough, take Mucinex DM twice a day as needed  sudafed (behind the counter) 30 mg every 4 hours as needed  If no better in 4 - 5  days : start a zpack Call anytime if the symptoms are severe, you have high fever or not feeling much better in 2 weeks

## 2011-08-11 ENCOUNTER — Ambulatory Visit: Payer: BC Managed Care – PPO | Admitting: Family Medicine

## 2011-10-15 ENCOUNTER — Other Ambulatory Visit: Payer: Self-pay | Admitting: Obstetrics and Gynecology

## 2011-10-15 DIAGNOSIS — Z1231 Encounter for screening mammogram for malignant neoplasm of breast: Secondary | ICD-10-CM

## 2011-11-02 ENCOUNTER — Ambulatory Visit: Payer: BC Managed Care – PPO | Attending: Sports Medicine | Admitting: Physical Therapy

## 2011-11-02 DIAGNOSIS — M25539 Pain in unspecified wrist: Secondary | ICD-10-CM | POA: Insufficient documentation

## 2011-11-02 DIAGNOSIS — IMO0001 Reserved for inherently not codable concepts without codable children: Secondary | ICD-10-CM | POA: Insufficient documentation

## 2011-11-03 ENCOUNTER — Ambulatory Visit: Payer: BC Managed Care – PPO | Admitting: Physical Therapy

## 2011-11-05 ENCOUNTER — Ambulatory Visit: Payer: BC Managed Care – PPO | Admitting: Physical Therapy

## 2011-11-08 ENCOUNTER — Ambulatory Visit: Payer: BC Managed Care – PPO | Admitting: Physical Therapy

## 2011-11-09 ENCOUNTER — Ambulatory Visit: Payer: BC Managed Care – PPO | Admitting: Physical Therapy

## 2011-11-11 ENCOUNTER — Ambulatory Visit: Payer: BC Managed Care – PPO | Admitting: Physical Therapy

## 2011-11-17 ENCOUNTER — Telehealth: Payer: Self-pay | Admitting: *Deleted

## 2011-11-17 NOTE — Telephone Encounter (Signed)
[facility] Triage Call Report Triage Record Num: [id number] Operator: Aundra Millet Patient Name: Melanie Taylor Call Date & Time: 11/16/2011 5:23:40PM Patient Phone: [phone] PCP: Lezlie Octave Patient Gender: Female PCP Fax : [phone] Patient DOB: 11/20/1965 Practice Name: [facility] Reason for Call: Caller: Conita/Patient; PCP: Sheliah Hatch.; CB#: [phone]; Call regarding Flu sx ; Pt wanting to know if Tamiflu can be called in - - Caller has been taking care of daughter who tested positive for flu and now she is feeling achy and oral temp 99.6. No cold sx's currently. RN adv home care advice for flu like illness and not in high risk group per FLu - Like Symptoms- RN also adv that she would be need to be seen to evaluate the need for Tamiflu

## 2011-11-17 NOTE — Telephone Encounter (Signed)
Spoke to Pt who indicated that symptoms have not increase  so she will continue to monitor them and if any changes occur will call for OV

## 2011-11-22 ENCOUNTER — Other Ambulatory Visit: Payer: Self-pay | Admitting: Family Medicine

## 2011-11-22 ENCOUNTER — Ambulatory Visit
Admission: RE | Admit: 2011-11-22 | Discharge: 2011-11-22 | Disposition: A | Discharge status: Home / Self Care | Payer: BC Managed Care – PPO | Source: Ambulatory Visit | Attending: Obstetrics and Gynecology | Admitting: Obstetrics and Gynecology

## 2011-11-22 DIAGNOSIS — Z1231 Encounter for screening mammogram for malignant neoplasm of breast: Secondary | ICD-10-CM

## 2011-11-23 ENCOUNTER — Telehealth: Payer: Self-pay | Admitting: Family Medicine

## 2011-11-23 MED ORDER — OMEPRAZOLE 20 MG PO CPDR: 20.0000 mg | DELAYED_RELEASE_CAPSULE | Freq: Every day | ORAL | Status: DC

## 2011-11-23 NOTE — Telephone Encounter (Signed)
rx sent to pharmacy by e-script  

## 2011-11-23 NOTE — Telephone Encounter (Deleted)
Refill: Omeprazole dr 20 mg capsule. Take 1 capsule every day. Qty 30. Last fill 10-18-11

## 2011-11-23 NOTE — Telephone Encounter (Signed)
Opened in error. Saw previous note regarding medication. BC

## 2011-12-06 ENCOUNTER — Telehealth: Payer: Self-pay | Admitting: Family Medicine

## 2011-12-06 NOTE — Telephone Encounter (Signed)
Refill: Omeprazole dr 20 mg capsule. Take 1 capsule by mouth daily. Qty 30. Last fill 11-23-11

## 2011-12-07 MED ORDER — OMEPRAZOLE 20 MG PO CPDR: 20.0000 mg | DELAYED_RELEASE_CAPSULE | Freq: Every day | ORAL | Status: DC

## 2011-12-07 NOTE — Telephone Encounter (Signed)
rx sent to pharmacy by e-script  

## 2011-12-22 ENCOUNTER — Other Ambulatory Visit: Payer: Self-pay | Admitting: Obstetrics and Gynecology

## 2011-12-27 ENCOUNTER — Other Ambulatory Visit: Payer: Self-pay | Admitting: Family Medicine

## 2011-12-27 MED ORDER — ALPRAZOLAM 0.5 MG PO TABS: 0.5000 mg | ORAL_TABLET | Freq: Three times a day (TID) | ORAL | Status: DC | PRN

## 2011-12-27 MED ORDER — OMEPRAZOLE 20 MG PO CPDR: 20.0000 mg | DELAYED_RELEASE_CAPSULE | Freq: Every day | ORAL | Status: DC

## 2011-12-27 NOTE — Telephone Encounter (Signed)
Rx sent 

## 2011-12-27 NOTE — Telephone Encounter (Signed)
Refill for  1-Omeprazole DR 20MG  Capsule Qty 30 Take 1-capsule by mouth daily Last fill date 2.12.13  2-Alprazolam 0.5MG  Tablet No qty listed Take 1/2 to 1-tablet twice a day as needed for nervousness Last fill date not listed  Last written 8.21.12

## 2011-12-27 NOTE — Telephone Encounter (Signed)
OK X1 

## 2011-12-27 NOTE — Telephone Encounter (Signed)
Last OV 08-10-11, 06-01-11 #30

## 2012-03-30 ENCOUNTER — Other Ambulatory Visit: Payer: Self-pay | Admitting: Family Medicine

## 2012-03-30 MED ORDER — OMEPRAZOLE 20 MG PO CPDR: 20.0000 mg | DELAYED_RELEASE_CAPSULE | Freq: Every day | ORAL | Status: DC

## 2012-03-30 NOTE — Telephone Encounter (Signed)
rx sent to pharmacy by e-script for one month Letter has been mailed to pt address noted in the chart to advise they are overdue for cpe/ov/labs and the pt needs to contact office to set up appt  CPE with fasting labs

## 2012-03-30 NOTE — Telephone Encounter (Signed)
Refill omeprazole DR 20 MG capsule #30, take one capsule by mouth daily Last fill 5.18.13 Last ov 10.30.12

## 2012-04-25 ENCOUNTER — Other Ambulatory Visit: Payer: Self-pay | Admitting: Family Medicine

## 2012-04-25 NOTE — Telephone Encounter (Signed)
OMEPRAZOLE DR 20MG  CAPSULE QTY:30 REFILLS:2 TAKE 1 CAPSULE (20 MG TOTAL) BY MOUTH DAILY.

## 2012-04-26 MED ORDER — OMEPRAZOLE 20 MG PO CPDR: 20.0000 mg | DELAYED_RELEASE_CAPSULE | Freq: Every day | ORAL | Status: DC

## 2012-04-26 NOTE — Telephone Encounter (Signed)
Pending appointment 05/2012

## 2012-05-11 ENCOUNTER — Ambulatory Visit (INDEPENDENT_AMBULATORY_CARE_PROVIDER_SITE_OTHER): Payer: BC Managed Care – PPO | Admitting: Family Medicine

## 2012-05-11 ENCOUNTER — Encounter: Payer: Self-pay | Admitting: Family Medicine

## 2012-05-11 VITALS — BP 118/80 | HR 86 | Temp 98.2°F | Ht 62.0 in | Wt 117.2 lb

## 2012-05-11 DIAGNOSIS — B880 Other acariasis: Secondary | ICD-10-CM

## 2012-05-11 MED ORDER — METHYLPREDNISOLONE ACETATE 80 MG/ML IJ SUSP
80.0000 mg | Freq: Once | INTRAMUSCULAR | Status: AC
  Administered 2012-05-11: 80 mg via INTRAMUSCULAR

## 2012-05-11 MED ORDER — PREDNISONE 20 MG PO TABS: ORAL_TABLET | ORAL | Status: DC

## 2012-05-11 MED ORDER — TRIAMCINOLONE ACETONIDE 0.1 % EX OINT: TOPICAL_OINTMENT | Freq: Two times a day (BID) | CUTANEOUS | Status: AC

## 2012-05-11 NOTE — Patient Instructions (Addendum)
Follow up as scheduled for your complete physical These are chiggers Start the Prednisone tomorrow- w/ food Use the Triamcinolone twice daily as needed for itching Call with any questions or concerns Hang in there!

## 2012-05-11 NOTE — Progress Notes (Signed)
  Subjective:    Patient ID: Melanie Taylor, female    DOB: 1966/01/31, 46 y.o.   MRN: [id number]  HPI Bug bites- first noticed yesterday morning, more this AM.  Occuring while she is sleeping.  Husband w/ similar bites.  Washed all sheets.  Husband is working outside.  Daughter w/out bites.  Took 2.5 benadryl last night for sleep but it's not working.   Review of Systems For ROS see HPI     Objective:   Physical Exam  Vitals reviewed. Skin: Skin is warm and dry.       Pt w/ multiple scattered bites on legs bilateral and 1 on lower abdomen- consistent w/ chigger bites.  No signs of infxn.          Assessment & Plan:

## 2012-05-16 ENCOUNTER — Ambulatory Visit: Payer: BC Managed Care – PPO | Admitting: Family Medicine

## 2012-05-16 NOTE — Assessment & Plan Note (Signed)
New.  Given # and wide distribution will start steroids for relief of itching.  Depo medrol given in office, oral prednisone to start tomorrow.  Topical triamcinolone prn.

## 2012-05-22 ENCOUNTER — Telehealth: Payer: Self-pay | Admitting: Family Medicine

## 2012-05-22 NOTE — Telephone Encounter (Signed)
FYI pt coming in for apt

## 2012-05-22 NOTE — Telephone Encounter (Signed)
Was supposed to route to MD Tabori, note apt for today

## 2012-05-22 NOTE — Telephone Encounter (Signed)
Caller: Arleatha/Patient; Patient Name: Melanie Taylor; PCP: Sheliah Hatch.; Best Callback Phone Number: [phone]     Chigger bites Wed 7/24, seen in office on 8/1, given shot and Prednisone.  While on Prednisone seemed to be improving but now symptoms again worse for the last week.  Still flair ups at intervals.  If warm is breaking out in welts.  Taking Benadryl Q6Hours, taking Baking Soda baths, taking Xanax at HS.   Vacation on  Wed, concern about heat, severe itching.  Triaged in Bites Guidelines - See 24 Provider within 24 hours due to severe itching.  Gave care information for itchy bites until appt 9:45am 8/13 Dr Beverely Low.

## 2012-05-23 ENCOUNTER — Encounter: Payer: Self-pay | Admitting: Family Medicine

## 2012-05-23 ENCOUNTER — Ambulatory Visit (INDEPENDENT_AMBULATORY_CARE_PROVIDER_SITE_OTHER): Payer: BC Managed Care – PPO | Admitting: Family Medicine

## 2012-05-23 VITALS — BP 124/75 | HR 69 | Temp 98.7°F | Ht 61.0 in | Wt 115.0 lb

## 2012-05-23 DIAGNOSIS — B880 Other acariasis: Secondary | ICD-10-CM

## 2012-05-23 MED ORDER — PREDNISONE 20 MG PO TABS: ORAL_TABLET | ORAL | Status: DC

## 2012-05-23 MED ORDER — METHYLPREDNISOLONE ACETATE 80 MG/ML IJ SUSP
80.0000 mg | Freq: Once | INTRAMUSCULAR | Status: AC
  Administered 2012-05-23: 80 mg via INTRAMUSCULAR

## 2012-05-23 NOTE — Assessment & Plan Note (Signed)
Unchanged.  Pt continues to have severe itching.  Will repeat prednisone course.  Reviewed supportive care and red flags that should prompt return.  Pt expressed understanding and is in agreement w/ plan.

## 2012-05-23 NOTE — Progress Notes (Signed)
  Subjective:    Patient ID: Melanie Taylor, female    DOB: 07/22/1966, 46 y.o.   MRN: [id number]  HPI Chigger bites- felt sxs were improving on the Prednisone but then returned when steroids were complete.  Taking benadryl several times/day w/out relief.  Using Triamcinolone.  Has done baking soda baths w/out relief.   Review of Systems For ROS see HPI     Objective:   Physical Exam  Vitals reviewed. Constitutional: She appears well-developed and well-nourished. No distress.  Skin: Skin is warm and dry. Rash (scattered red spots on lower extremities, buttocks consistent w/ chigger bites.  no sign of superimposed infxn.) noted.          Assessment & Plan:

## 2012-05-23 NOTE — Patient Instructions (Addendum)
Restart the Prednisone tomorrow- 2 tabs at the same time w/ breakfast x10 days Continue the benadryl as needed Hang in there!!

## 2012-05-30 ENCOUNTER — Other Ambulatory Visit: Payer: Self-pay | Admitting: Family Medicine

## 2012-05-30 MED ORDER — ALPRAZOLAM 0.5 MG PO TABS: 0.5000 mg | ORAL_TABLET | Freq: Three times a day (TID) | ORAL | Status: DC | PRN

## 2012-05-30 NOTE — Telephone Encounter (Signed)
Refill ALPRAZolam (Tab) 0.5 MG Take 1 tablet (0.5 mg total) by mouth 3 (three) times daily as needed. For anxiety - last fil 3.18.13 #30 Last ov 8.13.13 - acute

## 2012-05-30 NOTE — Telephone Encounter (Signed)
Pt confirmed that she is still taking this medication, noted last OV 05-23-12 last refill by MD Alwyn Ren noted #30 with 0 refills on 12-27-11

## 2012-05-30 NOTE — Telephone Encounter (Signed)
Ok for #30, no refills 

## 2012-05-30 NOTE — Telephone Encounter (Signed)
.  rx faxed to pharmacy, manually.  

## 2012-06-05 ENCOUNTER — Ambulatory Visit (INDEPENDENT_AMBULATORY_CARE_PROVIDER_SITE_OTHER): Payer: BC Managed Care – PPO | Admitting: Family Medicine

## 2012-06-05 ENCOUNTER — Encounter: Payer: Self-pay | Admitting: Family Medicine

## 2012-06-05 VITALS — BP 120/75 | HR 84 | Temp 98.1°F | Ht 61.25 in | Wt 114.8 lb

## 2012-06-05 DIAGNOSIS — Z0000 Encounter for general adult medical examination without abnormal findings: Secondary | ICD-10-CM

## 2012-06-05 DIAGNOSIS — F419 Anxiety disorder, unspecified: Secondary | ICD-10-CM

## 2012-06-05 DIAGNOSIS — F411 Generalized anxiety disorder: Secondary | ICD-10-CM

## 2012-06-05 DIAGNOSIS — Z13220 Encounter for screening for lipoid disorders: Secondary | ICD-10-CM

## 2012-06-05 LAB — LIPID PANEL
Cholesterol: 209 mg/dL — ABNORMAL HIGH (ref 0–200)
HDL: 100 mg/dL (ref >39.00)
Total CHOL/HDL Ratio: 2
Triglycerides: 99 mg/dL (ref 0.0–149.0)
VLDL: 19.8 mg/dL (ref 0.0–40.0)

## 2012-06-05 LAB — CBC WITH DIFFERENTIAL/PLATELET
Basophils Absolute: 0.1 10*3/uL (ref 0.0–0.1)
Basophils Relative: 1 % (ref 0.0–3.0)
Eosinophils Absolute: 0 10*3/uL (ref 0.0–0.7)
Eosinophils Relative: 0.3 % (ref 0.0–5.0)
HCT: 43.2 % (ref 36.0–46.0)
Hemoglobin: 14.1 g/dL (ref 12.0–15.0)
Lymphocytes Relative: 25.7 % (ref 12.0–46.0)
Lymphs Abs: 1.9 10*3/uL (ref 0.7–4.0)
MCHC: 32.6 g/dL (ref 30.0–36.0)
MCV: 93.3 fl (ref 78.0–100.0)
Monocytes Absolute: 0.6 10*3/uL (ref 0.1–1.0)
Monocytes Relative: 7.6 % (ref 3.0–12.0)
Neutro Abs: 4.9 10*3/uL (ref 1.4–7.7)
Neutrophils Relative %: 65.4 % (ref 43.0–77.0)
Platelets: 300 10*3/uL (ref 150.0–400.0)
RBC: 4.62 Mil/uL (ref 3.87–5.11)
RDW: 14.4 % (ref 11.5–14.6)
WBC: 7.5 10*3/uL (ref 4.5–10.5)

## 2012-06-05 LAB — TSH: TSH: 1.59 u[IU]/mL (ref 0.35–5.50)

## 2012-06-05 LAB — BASIC METABOLIC PANEL
BUN: 14 mg/dL (ref 6–23)
CO2: 29 mEq/L (ref 19–32)
Calcium: 9.2 mg/dL (ref 8.4–10.5)
Chloride: 102 mEq/L (ref 96–112)
Creatinine, Ser: 1 mg/dL (ref 0.4–1.2)
GFR: 66.38 mL/min (ref >60.00)
Glucose, Bld: 78 mg/dL (ref 70–99)
Potassium: 4.5 mEq/L (ref 3.5–5.1)
Sodium: 139 mEq/L (ref 135–145)

## 2012-06-05 LAB — HEPATIC FUNCTION PANEL
ALT: 16 U/L (ref 0–35)
AST: 20 U/L (ref 0–37)
Albumin: 4 g/dL (ref 3.5–5.2)
Alkaline Phosphatase: 44 U/L (ref 39–117)
Bilirubin, Direct: 0 mg/dL (ref 0.0–0.3)
Total Bilirubin: 0.8 mg/dL (ref 0.3–1.2)
Total Protein: 7.1 g/dL (ref 6.0–8.3)

## 2012-06-05 LAB — LDL CHOLESTEROL, DIRECT: Direct LDL: 90.9 mg/dL

## 2012-06-05 MED ORDER — BUSPIRONE HCL 15 MG PO TABS: ORAL_TABLET | ORAL | Status: DC

## 2012-06-05 NOTE — Assessment & Plan Note (Signed)
Pt's PE WNL.  UTD on GYN and colonoscopy.  Check labs.  Anticipatory guidance provided.  

## 2012-06-05 NOTE — Patient Instructions (Addendum)
Follow up in 1 year or as needed We'll notify you of your lab results Start the Buspar twice daily- start w/ 1/2 tab twice daily for 2 weeks and then increase to 1 tab Call with any questions or concerns Keep up the good work!  You look fantastic! Happy Labor Day!!!

## 2012-06-05 NOTE — Assessment & Plan Note (Signed)
Deteriorated.  Pt now taking xanax more frequently and still feels very 'on edge'.  Has tried various SSRIs in the past w/out results.  Will start Buspar.  Pt to call if any difficulties.

## 2012-06-05 NOTE — Progress Notes (Signed)
  Subjective:    Patient ID: Melanie Taylor, female    DOB: [birth year], 46 y.o.   MRN: [id number]  HPI CPE- UTD on GYN, colonoscopy.  Anxiety- pt finds herself taking 1/2 xanax throughout the day as needed.  Has been on Wellbutrin and Lexapro in the past w/out results.   Review of Systems Patient reports no vision/ hearing changes, adenopathy,fever, weight change,  persistant/recurrent hoarseness , swallowing issues, chest pain, palpitations, edema, persistant/recurrent cough, hemoptysis, dyspnea (rest/exertional/paroxysmal nocturnal), gastrointestinal bleeding (melena, rectal bleeding), abdominal pain, significant heartburn, bowel changes, GU symptoms (dysuria, hematuria, incontinence), Gyn symptoms (abnormal  bleeding, pain),  syncope, focal weakness, memory loss, numbness & tingling, skin/hair/nail changes, abnormal bruising or bleeding.     Objective:   Physical Exam General Appearance:    Alert, cooperative, no distress, appears stated age  Head:    Normocephalic, without obvious abnormality, atraumatic  Eyes:    PERRL, conjunctiva/corneas clear, EOM's intact, fundi    benign, both eyes  Ears:    Normal TM's and external ear canals, both ears  Nose:   Nares normal, septum midline, mucosa normal, no drainage    or sinus tenderness  Throat:   Lips, mucosa, and tongue normal; teeth and gums normal  Neck:   Supple, symmetrical, trachea midline, no adenopathy;    Thyroid: no enlargement/tenderness/nodules  Back:     Symmetric, no curvature, ROM normal, no CVA tenderness  Lungs:     Clear to auscultation bilaterally, respirations unlabored  Chest Wall:    No tenderness or deformity   Heart:    Regular rate and rhythm, S1 and S2 normal, no murmur, rub   or gallop  Breast Exam:    Deferred to GYN  Abdomen:     Soft, non-tender, bowel sounds active all four quadrants,    no masses, no organomegaly  Genitalia:    Deferred to GYN  Rectal:    Extremities:   Extremities normal, atraumatic,  no cyanosis or edema  Pulses:   2+ and symmetric all extremities  Skin:   Skin color, texture, turgor normal, no rashes or lesions  Lymph nodes:   Cervical, supraclavicular, and axillary nodes normal  Neurologic:   CNII-XII intact, normal strength, sensation and reflexes    throughout          Assessment & Plan:

## 2012-06-06 ENCOUNTER — Encounter: Payer: Self-pay | Admitting: *Deleted

## 2012-06-07 LAB — VITAMIN D 1,25 DIHYDROXY
Vitamin D 1, 25 (OH)2 Total: 32 pg/mL (ref 18–72)
Vitamin D2 1, 25 (OH)2: <8 pg/mL
Vitamin D3 1, 25 (OH)2: 32 pg/mL

## 2012-06-22 ENCOUNTER — Other Ambulatory Visit: Payer: Self-pay | Admitting: Family Medicine

## 2012-06-22 MED ORDER — OMEPRAZOLE 20 MG PO CPDR: 20.0000 mg | DELAYED_RELEASE_CAPSULE | Freq: Every day | ORAL | Status: DC

## 2012-06-22 NOTE — Telephone Encounter (Signed)
rx sent to pharmacy by e-script  

## 2012-06-22 NOTE — Telephone Encounter (Signed)
Refill Omeprazole DR 20 MG Capsule #30 take on capsule by mouth daily -- last fill 8.14.13 Last ov 8.26.13 V70

## 2012-07-07 ENCOUNTER — Telehealth: Payer: Self-pay | Admitting: Family Medicine

## 2012-07-07 MED ORDER — ALPRAZOLAM 0.5 MG PO TABS: 0.5000 mg | ORAL_TABLET | Freq: Three times a day (TID) | ORAL | Status: DC | PRN

## 2012-07-07 NOTE — Telephone Encounter (Signed)
Ok for #30, 1 refill 

## 2012-07-07 NOTE — Telephone Encounter (Signed)
Refill done.  

## 2012-07-07 NOTE — Telephone Encounter (Signed)
Refill: Alprazolam 0.5mg  tab. Take 1 tablet by mouth 3 times a day as needed for sleep. Last seen 05/24/11. Last fill 8.20.13 #30. Plz okay/ Advise

## 2012-07-07 NOTE — Telephone Encounter (Signed)
Refill: Alprazolam 0.5mg  tab. Take 1 tablet by mouth 3 times a day as needed for sleep. Last fill 8.20.13

## 2012-10-30 ENCOUNTER — Other Ambulatory Visit: Payer: Self-pay | Admitting: Family Medicine

## 2012-10-30 NOTE — Telephone Encounter (Signed)
ALPRAZOLAM 0.5mg   Last fill:10.20.2013 Take 1 tablet by mouth 3 times a day as needed for sleep

## 2012-10-30 NOTE — Telephone Encounter (Signed)
Ok for #30, no refills.  Should say TID PRN anxiety (not sleep).  needs to sign agreement and do UDS if not already done

## 2012-10-30 NOTE — Telephone Encounter (Signed)
Patient would like refill on alprazolam, Last fill 07/30/12 #30. Ok to refill? Please advise.

## 2012-10-31 MED ORDER — ALPRAZOLAM 0.5 MG PO TABS: 0.5000 mg | ORAL_TABLET | Freq: Three times a day (TID) | ORAL | Status: DC | PRN

## 2012-10-31 NOTE — Telephone Encounter (Signed)
Detailed msg left advising Rx ready for pick up.       KP

## 2012-11-06 ENCOUNTER — Other Ambulatory Visit: Payer: Self-pay | Admitting: *Deleted

## 2012-11-06 DIAGNOSIS — F419 Anxiety disorder, unspecified: Secondary | ICD-10-CM

## 2012-11-06 MED ORDER — ALPRAZOLAM 0.5 MG PO TABS: 0.5000 mg | ORAL_TABLET | Freq: Three times a day (TID) | ORAL | Status: DC | PRN

## 2012-11-06 NOTE — Telephone Encounter (Signed)
Rx and controlled substance contract printed and taking up front for the pt.//AB/CMA

## 2012-11-25 ENCOUNTER — Other Ambulatory Visit: Payer: Self-pay

## 2012-11-27 ENCOUNTER — Encounter: Payer: Self-pay | Admitting: Family Medicine

## 2012-12-07 ENCOUNTER — Other Ambulatory Visit: Payer: Self-pay | Admitting: Obstetrics and Gynecology

## 2012-12-07 DIAGNOSIS — N644 Mastodynia: Secondary | ICD-10-CM

## 2012-12-07 DIAGNOSIS — Z9882 Breast implant status: Secondary | ICD-10-CM

## 2012-12-14 ENCOUNTER — Ambulatory Visit (INDEPENDENT_AMBULATORY_CARE_PROVIDER_SITE_OTHER): Payer: BC Managed Care – PPO | Admitting: Family Medicine

## 2012-12-14 ENCOUNTER — Encounter: Payer: Self-pay | Admitting: Family Medicine

## 2012-12-14 VITALS — BP 104/78 | HR 70 | Temp 98.1°F | Ht 62.5 in | Wt 117.2 lb

## 2012-12-14 DIAGNOSIS — H6983 Other specified disorders of Eustachian tube, bilateral: Secondary | ICD-10-CM

## 2012-12-14 DIAGNOSIS — H6993 Unspecified Eustachian tube disorder, bilateral: Secondary | ICD-10-CM

## 2012-12-14 DIAGNOSIS — H6980 Other specified disorders of Eustachian tube, unspecified ear: Secondary | ICD-10-CM

## 2012-12-14 NOTE — Progress Notes (Signed)
  Subjective:    Patient ID: Melanie Taylor, female    DOB: 1966-04-30, 47 y.o.   MRN: [id number]  HPI Ear pain- pt reports that starting a 'few years ago' she developed recurrent ear infections.  L ear became uncomfortable 'a couple of weeks ago'.  Now w/ bilateral ear discomfort.  Sensation of fullness.  sxs are intermittent.  Sees allergist regularly- on meds daily (flonase, allegra, patanase).  No fevers.  This week has had mild sinus pressure.   Review of Systems For ROS see HPI     Objective:   Physical Exam  Vitals reviewed. Constitutional: She appears well-developed and well-nourished. No distress.  HENT:  Head: Normocephalic and atraumatic.  Right Ear: Tympanic membrane is not retracted.  Left Ear: Tympanic membrane is not retracted.  Nose: Mucosal edema and rhinorrhea present. Right sinus exhibits no maxillary sinus tenderness and no frontal sinus tenderness. Left sinus exhibits no maxillary sinus tenderness and no frontal sinus tenderness.  Mouth/Throat: Mucous membranes are normal. Posterior oropharyngeal erythema (w/ PND) present.  Eyes: Conjunctivae and EOM are normal. Pupils are equal, round, and reactive to light.  Neck: Normal range of motion. Neck supple.  Cardiovascular: Normal rate, regular rhythm and normal heart sounds.   Pulmonary/Chest: Effort normal and breath sounds normal. No respiratory distress. She has no wheezes. She has no rales.  Lymphadenopathy:    She has no cervical adenopathy.          Assessment & Plan:

## 2012-12-14 NOTE — Patient Instructions (Addendum)
This appears to be all allergy related Continue the nasal sprays- no need for antibiotics Keep doing what you're doing! Hang in there! Be safe tomorrow!!!

## 2012-12-14 NOTE — Assessment & Plan Note (Signed)
New.  Pt's discomfort appears to be all allergy related.  No evidence of infxn.  Continue current regimen

## 2012-12-19 ENCOUNTER — Ambulatory Visit
Admission: RE | Admit: 2012-12-19 | Discharge: 2012-12-19 | Disposition: A | Discharge status: Home / Self Care | Payer: BC Managed Care – PPO | Source: Ambulatory Visit | Attending: Obstetrics and Gynecology | Admitting: Obstetrics and Gynecology

## 2012-12-19 DIAGNOSIS — Z9882 Breast implant status: Secondary | ICD-10-CM

## 2012-12-19 DIAGNOSIS — N644 Mastodynia: Secondary | ICD-10-CM

## 2012-12-24 ENCOUNTER — Other Ambulatory Visit: Payer: Self-pay | Admitting: Family Medicine

## 2012-12-25 NOTE — Telephone Encounter (Signed)
Please advise on RF request for the Xanax.  Last OV:06-05-12.//AB/CMA

## 2013-02-11 ENCOUNTER — Emergency Department (HOSPITAL_BASED_OUTPATIENT_CLINIC_OR_DEPARTMENT_OTHER)
Admission: EM | Admit: 2013-02-11 | Discharge: 2013-02-11 | Disposition: A | Discharge status: Home / Self Care | Payer: BC Managed Care – PPO | Attending: Emergency Medicine | Admitting: Emergency Medicine

## 2013-02-11 ENCOUNTER — Encounter (HOSPITAL_BASED_OUTPATIENT_CLINIC_OR_DEPARTMENT_OTHER): Payer: Self-pay | Admitting: *Deleted

## 2013-02-11 DIAGNOSIS — K5289 Other specified noninfective gastroenteritis and colitis: Secondary | ICD-10-CM | POA: Insufficient documentation

## 2013-02-11 DIAGNOSIS — IMO0002 Reserved for concepts with insufficient information to code with codable children: Secondary | ICD-10-CM | POA: Insufficient documentation

## 2013-02-11 DIAGNOSIS — Z3202 Encounter for pregnancy test, result negative: Secondary | ICD-10-CM | POA: Insufficient documentation

## 2013-02-11 DIAGNOSIS — K219 Gastro-esophageal reflux disease without esophagitis: Secondary | ICD-10-CM | POA: Insufficient documentation

## 2013-02-11 DIAGNOSIS — R5383 Other fatigue: Secondary | ICD-10-CM | POA: Insufficient documentation

## 2013-02-11 DIAGNOSIS — Z8719 Personal history of other diseases of the digestive system: Secondary | ICD-10-CM | POA: Insufficient documentation

## 2013-02-11 DIAGNOSIS — Z79899 Other long term (current) drug therapy: Secondary | ICD-10-CM | POA: Insufficient documentation

## 2013-02-11 DIAGNOSIS — R197 Diarrhea, unspecified: Secondary | ICD-10-CM | POA: Insufficient documentation

## 2013-02-11 DIAGNOSIS — Z862 Personal history of diseases of the blood and blood-forming organs and certain disorders involving the immune mechanism: Secondary | ICD-10-CM | POA: Insufficient documentation

## 2013-02-11 DIAGNOSIS — R5381 Other malaise: Secondary | ICD-10-CM | POA: Insufficient documentation

## 2013-02-11 DIAGNOSIS — R6883 Chills (without fever): Secondary | ICD-10-CM | POA: Insufficient documentation

## 2013-02-11 DIAGNOSIS — Z8739 Personal history of other diseases of the musculoskeletal system and connective tissue: Secondary | ICD-10-CM | POA: Insufficient documentation

## 2013-02-11 DIAGNOSIS — R1013 Epigastric pain: Secondary | ICD-10-CM | POA: Insufficient documentation

## 2013-02-11 DIAGNOSIS — K529 Noninfective gastroenteritis and colitis, unspecified: Secondary | ICD-10-CM

## 2013-02-11 DIAGNOSIS — H9209 Otalgia, unspecified ear: Secondary | ICD-10-CM | POA: Insufficient documentation

## 2013-02-11 DIAGNOSIS — Z8639 Personal history of other endocrine, nutritional and metabolic disease: Secondary | ICD-10-CM | POA: Insufficient documentation

## 2013-02-11 DIAGNOSIS — J029 Acute pharyngitis, unspecified: Secondary | ICD-10-CM | POA: Insufficient documentation

## 2013-02-11 LAB — URINE MICROSCOPIC-ADD ON

## 2013-02-11 LAB — URINALYSIS, ROUTINE W REFLEX MICROSCOPIC
Bilirubin Urine: NEGATIVE
Glucose, UA: NEGATIVE mg/dL
Ketones, ur: >80 mg/dL — AB
Leukocytes, UA: NEGATIVE
Nitrite: NEGATIVE
Protein, ur: NEGATIVE mg/dL
Specific Gravity, Urine: 1.01 (ref 1.005–1.030)
Urobilinogen, UA: 0.2 mg/dL (ref 0.0–1.0)
pH: 5 (ref 5.0–8.0)

## 2013-02-11 LAB — PREGNANCY, URINE: Preg Test, Ur: NEGATIVE

## 2013-02-11 MED ORDER — FENTANYL CITRATE 0.05 MG/ML IJ SOLN
50.0000 ug | Freq: Once | INTRAMUSCULAR | Status: AC
  Administered 2013-02-11: 50 ug via INTRAVENOUS
  Filled 2013-02-11: qty 2

## 2013-02-11 MED ORDER — DIPHENOXYLATE-ATROPINE 2.5-0.025 MG PO TABS: 1.0000 | ORAL_TABLET | Freq: Four times a day (QID) | ORAL | Status: DC | PRN

## 2013-02-11 MED ORDER — ONDANSETRON HCL 4 MG/2ML IJ SOLN
4.0000 mg | Freq: Once | INTRAMUSCULAR | Status: AC
  Administered 2013-02-11: 4 mg via INTRAVENOUS
  Filled 2013-02-11: qty 2

## 2013-02-11 MED ORDER — ONDANSETRON 8 MG PO TBDP: 8.0000 mg | ORAL_TABLET | Freq: Three times a day (TID) | ORAL | Status: DC | PRN

## 2013-02-11 MED ORDER — SODIUM CHLORIDE 0.9 % IV BOLUS (SEPSIS)
1000.0000 mL | Freq: Once | INTRAVENOUS | Status: AC
  Administered 2013-02-11: 1000 mL via INTRAVENOUS

## 2013-02-11 MED ORDER — DIPHENOXYLATE-ATROPINE 2.5-0.025 MG PO TABS
2.0000 | ORAL_TABLET | Freq: Once | ORAL | Status: AC
  Administered 2013-02-11: 2 via ORAL
  Filled 2013-02-11: qty 2

## 2013-02-11 NOTE — ED Provider Notes (Signed)
History     CSN: [id number]  Arrival date & time 02/11/13  0506   None     Chief Complaint  Patient presents with  . Nausea, Vomiting and Diarrhea     (Consider location/radiation/quality/duration/timing/severity/associated sxs/prior treatment) HPI This is a 47 year old female who developed bilateral ear pain and sore throat yesterday afternoon. She developed diarrhea beginning about midnight this morning and vomiting that began soon after. She has had about 5 episodes of emesis. Emesis is characterized as a stomach contents. She states she has lost a significant amount of fluid and feels weak and "parched" (dry mouth). Symptoms are moderate to severe. There no specific mitigating or exacerbating factors. She has had abdominal cramping with this. She has had chills but no fever. She denies chest pain or shortness of breath.  Past Medical History  Diagnosis Date  . Arthritis   . Allergy   . GERD (gastroesophageal reflux disease)   . Thyroid disease     hyperthyroidism  . IBS (irritable bowel syndrome)     Past Surgical History  Procedure Laterality Date  . Cesarean section  06/2003  . Knee surgery  2003  . Shoulder surgery  2010    Family History  Problem Relation Age of Onset  . Arthritis Mother   . Hypothyroidism Mother   . Diabetes Mother   . Alcohol abuse Father   . Heart attack Father   . Sudden death Father   . Diabetes Father   . Hypothyroidism Maternal Grandmother   . Cancer Maternal Grandfather     COLON  . Diabetes Other     GRANDFATHER    History  Substance Use Topics  . Smoking status: Never Smoker   . Smokeless tobacco: Not on file  . Alcohol Use: Yes     Comment: socially    OB History   Grav Para Term Preterm Abortions TAB SAB Ect Mult Living                  Review of Systems  All other systems reviewed and are negative.    Allergies  Review of patient's allergies indicates no known allergies.  Home Medications   Current  Outpatient Rx  Name  Route  Sig  Dispense  Refill  . acyclovir (ZOVIRAX) 800 MG tablet               . ALPRAZolam (XANAX) 0.5 MG tablet      TAKE 1 TABLET BY MOUTH 3 TIMES A DAY AS NEEDED FOR ANXIETY   30 tablet   1   . busPIRone (BUSPAR) 15 MG tablet      1 tab bid   60 tablet   6   . fluticasone (FLONASE) 50 MCG/ACT nasal spray               . Olopatadine HCl (PATANASE NA)   Nasal   Place 2 sprays into the nose daily.         Marland Kitchen omeprazole (PRILOSEC) 20 MG capsule   Oral   Take 1 capsule (20 mg total) by mouth daily.   30 capsule   6   . predniSONE (DELTASONE) 20 MG tablet      2 tabs daily at the same time (w/ food)   20 tablet   0   . triamcinolone ointment (KENALOG) 0.1 %   Topical   Apply topically 2 (two) times daily.   90 g   1   . ZOVIA 1/35E, 28, 1-35  MG-MCG tablet   Oral   Take 1 tablet by mouth daily.            BP 110/66  Pulse 90  Temp(Src) 97.8 F (36.6 C) (Oral)  SpO2 98%  LMP 01/29/2013  Physical Exam General: Well-developed, well-nourished female in no acute distress; appearance consistent with age of record HENT: normocephalic, atraumatic Eyes: pupils equal round and reactive to light; extraocular muscles intact Neck: supple Heart: regular rate and rhythm; no murmurs Lungs: clear to auscultation bilaterally Abdomen: soft; nondistended; mild diffuse tenderness, greatest in the epigastrium; no masses or hepatosplenomegaly; bowel sounds present Extremities: No deformity; full range of motion; pulses normal Neurologic: Awake, alert and oriented; motor function intact in all extremities and symmetric; no facial droop Skin: Warm and dry Psychiatric: Normal mood and affect    ED Course  Procedures (including critical care time)     MDM   Nursing notes and vitals signs, including pulse oximetry, reviewed.  Summary of this visit's results, reviewed by myself:  Labs:  Results for orders placed during the hospital  encounter of 02/11/13 (from the past 24 hour(s))  URINALYSIS, ROUTINE W REFLEX MICROSCOPIC     Status: Abnormal   Collection Time    02/11/13  5:15 AM      Result Value Range   Color, Urine YELLOW  YELLOW   APPearance HAZY (*) CLEAR   Specific Gravity, Urine 1.010  1.005 - 1.030   pH 5.0  5.0 - 8.0   Glucose, UA NEGATIVE  NEGATIVE mg/dL   Hgb urine dipstick TRACE (*) NEGATIVE   Bilirubin Urine NEGATIVE  NEGATIVE   Ketones, ur >80 (*) NEGATIVE mg/dL   Protein, ur NEGATIVE  NEGATIVE mg/dL   Urobilinogen, UA 0.2  0.0 - 1.0 mg/dL   Nitrite NEGATIVE  NEGATIVE   Leukocytes, UA NEGATIVE  NEGATIVE  URINE MICROSCOPIC-ADD ON     Status: Abnormal   Collection Time    02/11/13  5:15 AM      Result Value Range   Squamous Epithelial / LPF MANY (*) RARE   WBC, UA 0-2  <3 WBC/hpf   RBC / HPF 0-2  <3 RBC/hpf   Bacteria, UA FEW (*) RARE   Urine-Other LESS THAN 10 mL OF URINE SUBMITTED    PREGNANCY, URINE     Status: None   Collection Time    02/11/13  5:15 AM      Result Value Range   Preg Test, Ur NEGATIVE  NEGATIVE   7:07 AM Feels better after IV fluid bolus and IV medications. Drinking fluids without emesis.        Hanley Seamen, MD 02/11/13 [phone]

## 2013-02-11 NOTE — ED Notes (Addendum)
C/o diarrhea since midnight. States vomiting started at 1am and has had four episodes last at 315am. C/o feeling nausea that comes and goes. C/o general abd soreness. Also c/o soreness to neck/shoulder area as well. Denies any fevers

## 2013-02-11 NOTE — ED Notes (Signed)
Tolerated po fluids

## 2013-03-27 ENCOUNTER — Other Ambulatory Visit: Payer: Self-pay | Admitting: Family Medicine

## 2013-03-28 NOTE — Telephone Encounter (Signed)
Last refill:02-04-13 Last CPE:06-05-12 Please advise.//AB/CMA

## 2013-04-02 ENCOUNTER — Other Ambulatory Visit: Payer: Self-pay | Admitting: Obstetrics and Gynecology

## 2013-05-30 ENCOUNTER — Other Ambulatory Visit: Payer: Self-pay | Admitting: Family Medicine

## 2013-06-04 ENCOUNTER — Telehealth: Payer: Self-pay | Admitting: *Deleted

## 2013-06-04 NOTE — Telephone Encounter (Signed)
Rx: Xanax 0.5mg  tab. Please advise

## 2013-06-04 NOTE — Telephone Encounter (Signed)
Last OV-03/06/201 Last Filled-03/27/2013 UDS-11/06/2012-Low Risk

## 2013-06-05 MED ORDER — ALPRAZOLAM 0.5 MG PO TABS: ORAL_TABLET | ORAL | Status: DC

## 2013-06-05 NOTE — Telephone Encounter (Signed)
Ok for #30, 1 refill 

## 2013-06-05 NOTE — Telephone Encounter (Signed)
Med filled.  

## 2013-07-26 ENCOUNTER — Other Ambulatory Visit: Payer: Self-pay | Admitting: Family Medicine

## 2013-07-26 NOTE — Telephone Encounter (Signed)
Med filled.  

## 2013-08-16 ENCOUNTER — Other Ambulatory Visit: Payer: Self-pay

## 2013-11-01 ENCOUNTER — Other Ambulatory Visit: Payer: Self-pay | Admitting: Family Medicine

## 2013-11-01 NOTE — Telephone Encounter (Signed)
Med filled.  

## 2013-11-16 ENCOUNTER — Other Ambulatory Visit: Payer: Self-pay

## 2013-11-16 DIAGNOSIS — Z1231 Encounter for screening mammogram for malignant neoplasm of breast: Secondary | ICD-10-CM

## 2013-12-07 ENCOUNTER — Ambulatory Visit: Payer: Self-pay | Admitting: Podiatrist

## 2013-12-13 ENCOUNTER — Encounter: Payer: Self-pay | Admitting: Family Medicine

## 2013-12-13 ENCOUNTER — Telehealth: Payer: Self-pay

## 2013-12-13 ENCOUNTER — Ambulatory Visit (INDEPENDENT_AMBULATORY_CARE_PROVIDER_SITE_OTHER): Payer: BC Managed Care – PPO | Admitting: Family Medicine

## 2013-12-13 ENCOUNTER — Ambulatory Visit (HOSPITAL_BASED_OUTPATIENT_CLINIC_OR_DEPARTMENT_OTHER)
Admission: RE | Admit: 2013-12-13 | Discharge: 2013-12-13 | Disposition: A | Discharge status: Home / Self Care | Payer: BC Managed Care – PPO | Source: Ambulatory Visit | Attending: Family Medicine | Admitting: Family Medicine

## 2013-12-13 VITALS — BP 120/78 | HR 75 | Temp 98.4°F | Resp 16 | Wt 114.1 lb

## 2013-12-13 DIAGNOSIS — M79609 Pain in unspecified limb: Secondary | ICD-10-CM | POA: Insufficient documentation

## 2013-12-13 DIAGNOSIS — M79662 Pain in left lower leg: Secondary | ICD-10-CM

## 2013-12-13 NOTE — Patient Instructions (Signed)
We'll notify you of your imaging results Ibuprofen as needed for pain ICE!  Elevate! If doppler is negative for clot, we'll refer you to sports medicine for additional evaluation Call with any questions or concerns Hang in there!

## 2013-12-13 NOTE — Progress Notes (Signed)
   Subjective:    Patient ID: Melanie Taylor, female    DOB: 15-Jan-1966, 48 y.o.   MRN: [id number]  HPI L calf pain- sxs started beginning of the week.  Foot is now numb, 'like it wants to go to sleep'.  No recent change in activity level.  No redness or swelling.  Dad had DVT w/o sxs.  Currently on OCPs, not a smoker.   Review of Systems For ROS see HPI     Objective:   Physical Exam  Vitals reviewed. Constitutional: She is oriented to person, place, and time. She appears well-developed and well-nourished. No distress.  Cardiovascular: Intact distal pulses.   Musculoskeletal: Normal range of motion. She exhibits tenderness (along L lower leg medially and L posterior knee). She exhibits no edema.  Neurological: She is alert and oriented to person, place, and time. She has normal reflexes. Coordination normal.  Skin: Skin is warm and dry. No erythema.          Assessment & Plan:

## 2013-12-13 NOTE — Progress Notes (Signed)
Pre visit review using our clinic review tool, if applicable. No additional management support is needed unless otherwise documented below in the visit note. 

## 2013-12-13 NOTE — Telephone Encounter (Signed)
Patient coming in today at 1pm to see Dr. Beverely Lowabori.  Patient is currently experiencing L leg pain behind the knee just above the calf.  Client states that it's "really sore" and her foot keeps falling asleep.  Patient denies increased warmth, swelling or redness at the site.  Client does take birth control and sits for long periods at a desk at work. Patient and husband are concerned because father recently had a heart attack and husband recently had a DVT without external symptoms.  Patient was encouraged to come in during scheduled appointment to be seen.  She was also advised that if she experiences any change in her condition to immediately report to the emergency room.  Patient stated understanding and did not voice any further questions or concerns.

## 2013-12-13 NOTE — Telephone Encounter (Signed)
fyi

## 2013-12-13 NOTE — Assessment & Plan Note (Signed)
New.  Check doppler as pt has unilateral leg pain, family hx and is on OCPs.  If + will treat w/ xarelto.  If negative, will consider referral to Sports Med vs watchful waiting.  Will follow.

## 2013-12-14 ENCOUNTER — Ambulatory Visit (INDEPENDENT_AMBULATORY_CARE_PROVIDER_SITE_OTHER): Payer: BC Managed Care – PPO

## 2013-12-14 ENCOUNTER — Ambulatory Visit (INDEPENDENT_AMBULATORY_CARE_PROVIDER_SITE_OTHER): Payer: BC Managed Care – PPO | Admitting: Podiatrist

## 2013-12-14 ENCOUNTER — Encounter: Payer: Self-pay | Admitting: Podiatrist

## 2013-12-14 VITALS — BP 108/76 | HR 78 | Resp 18

## 2013-12-14 DIAGNOSIS — M779 Enthesopathy, unspecified: Secondary | ICD-10-CM

## 2013-12-14 DIAGNOSIS — M2140 Flat foot [pes planus] (acquired), unspecified foot: Secondary | ICD-10-CM

## 2013-12-14 DIAGNOSIS — M79609 Pain in unspecified limb: Secondary | ICD-10-CM

## 2013-12-14 NOTE — Progress Notes (Signed)
° °  Subjective:    Patient ID: Melanie Taylor, female    DOB: [birth year], 48 y.o.   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  All other systems reviewed and are negative.       Objective:   Physical Exam GENERAL APPEARANCE: Alert, conversant. Appropriately groomed. No acute distress.  VASCULAR: Pedal pulses palpable at 2/4 DP and PT bilateral.  Capillary refill time is immediate to all digits,  Proximal to distal cooling it warm to warm.  Digital hair growth is present bilateral  NEUROLOGIC: sensation is intact epicritically and protectively to 5.07 monofilament at 5/5 sites bilateral.  Light touch is intact bilateral, vibratory sensation intact bilateral, achilles tendon reflex is intact bilateral.  MUSCULOSKELETAL: decreased arch height with pes planus seen bilateral. Discomfort at plantar fascial insertion noted bilaterally.  acceptable muscle strength, tone and stability bilateral.  Intrinsic muscluature intact bilateral.   DERMATOLOGIC: skin color, texture, and turger are within normal limits.  No preulcerative lesions are seen, no interdigital maceration noted.  No open lesions present.  Digital nails are asymptomatic.      Assessment & Plan:  Plantar fasciitis, pes planus  Plan:  Discussed treatment options and alternatives-- she would like new orthotics and she was scanned at today's visit-  We will make her 2 new pair.. One for an athletic shoe and another one for her dressier shoes.  A scan was performed and rx sent [facility].  A plantar fascial taping was applied bilateral and I will see her back in 2 weeks for orthotic dispensing.  Possible injection will be carried out at that visit.  Also remember to ask how her left posterior knee feels as she possibly has a bakers cyst there.

## 2013-12-14 NOTE — Patient Instructions (Signed)

## 2013-12-14 NOTE — Progress Notes (Deleted)
Flat feet-- new orthotics -- 2 pair, dress and athletic  Scan, tape, and back in 2 weeks for orthotic pickup-- possible inject at that time  Bakers cyst

## 2013-12-20 ENCOUNTER — Ambulatory Visit: Payer: BC Managed Care – PPO | Admitting: Podiatrist

## 2013-12-28 ENCOUNTER — Encounter: Payer: Self-pay | Admitting: Podiatrist

## 2013-12-28 ENCOUNTER — Ambulatory Visit (INDEPENDENT_AMBULATORY_CARE_PROVIDER_SITE_OTHER): Payer: BC Managed Care – PPO | Admitting: Podiatrist

## 2013-12-28 VITALS — BP 108/66 | HR 70 | Resp 16

## 2013-12-28 DIAGNOSIS — M2140 Flat foot [pes planus] (acquired), unspecified foot: Secondary | ICD-10-CM

## 2013-12-28 DIAGNOSIS — M779 Enthesopathy, unspecified: Secondary | ICD-10-CM

## 2013-12-28 NOTE — Progress Notes (Signed)
Patient presents today to pick up her orthotics. The orthotics are not in and therefore we will need to send orthotics directed to her home. She will let me know they do not fit her well. Also note a plantar fascial strapping which is removable was applied to her left foot as a last plantar fascial strapping which was not removable irritated her she had a reaction. She continues to have pain of bilateral feet which is moderate in nature and should be relieved by the orthotics.

## 2013-12-28 NOTE — Patient Instructions (Signed)
We will mail the orthotics to you directly.  Call if they need adjustment

## 2013-12-31 ENCOUNTER — Other Ambulatory Visit: Payer: Self-pay | Admitting: Family Medicine

## 2013-12-31 NOTE — Telephone Encounter (Signed)
Last ov 12-13-13 Med filled 06-05-13 #30 with 1

## 2013-12-31 NOTE — Telephone Encounter (Signed)
Med filled and faxed.  

## 2014-01-10 ENCOUNTER — Ambulatory Visit
Admission: RE | Admit: 2014-01-10 | Discharge: 2014-01-10 | Disposition: A | Discharge status: Home / Self Care | Payer: BC Managed Care – PPO | Source: Ambulatory Visit

## 2014-01-10 ENCOUNTER — Other Ambulatory Visit: Payer: Self-pay

## 2014-01-10 DIAGNOSIS — Z1231 Encounter for screening mammogram for malignant neoplasm of breast: Secondary | ICD-10-CM

## 2014-01-29 ENCOUNTER — Other Ambulatory Visit: Payer: Self-pay | Admitting: Family Medicine

## 2014-01-30 NOTE — Telephone Encounter (Signed)
Med filled.  

## 2014-02-14 ENCOUNTER — Ambulatory Visit: Payer: BC Managed Care – PPO | Attending: Physical Medicine and Rehabilitation | Admitting: Physical Therapy

## 2014-02-14 DIAGNOSIS — M545 Low back pain, unspecified: Secondary | ICD-10-CM | POA: Diagnosis not present

## 2014-02-14 DIAGNOSIS — Z9889 Other specified postprocedural states: Secondary | ICD-10-CM | POA: Diagnosis not present

## 2014-02-14 DIAGNOSIS — IMO0001 Reserved for inherently not codable concepts without codable children: Secondary | ICD-10-CM | POA: Insufficient documentation

## 2014-02-25 ENCOUNTER — Ambulatory Visit: Payer: BC Managed Care – PPO | Admitting: Physical Therapy

## 2014-02-28 ENCOUNTER — Ambulatory Visit: Payer: BC Managed Care – PPO | Admitting: Physical Therapy

## 2014-02-28 DIAGNOSIS — IMO0001 Reserved for inherently not codable concepts without codable children: Secondary | ICD-10-CM | POA: Diagnosis not present

## 2014-03-05 ENCOUNTER — Ambulatory Visit: Payer: BC Managed Care – PPO | Admitting: Physical Therapy

## 2014-03-05 DIAGNOSIS — IMO0001 Reserved for inherently not codable concepts without codable children: Secondary | ICD-10-CM | POA: Diagnosis not present

## 2014-03-07 ENCOUNTER — Ambulatory Visit: Payer: BC Managed Care – PPO | Admitting: Physical Therapy

## 2014-03-07 DIAGNOSIS — IMO0001 Reserved for inherently not codable concepts without codable children: Secondary | ICD-10-CM | POA: Diagnosis not present

## 2014-03-11 ENCOUNTER — Ambulatory Visit: Payer: BC Managed Care – PPO | Admitting: Physical Therapy

## 2014-03-14 ENCOUNTER — Encounter: Payer: BC Managed Care – PPO | Admitting: Rehabilitation

## 2014-03-18 ENCOUNTER — Encounter: Payer: BC Managed Care – PPO | Admitting: Rehabilitation

## 2014-03-19 ENCOUNTER — Ambulatory Visit: Payer: BC Managed Care – PPO | Attending: Physical Medicine and Rehabilitation | Admitting: Physical Therapy

## 2014-03-19 DIAGNOSIS — M545 Low back pain, unspecified: Secondary | ICD-10-CM | POA: Insufficient documentation

## 2014-03-19 DIAGNOSIS — IMO0001 Reserved for inherently not codable concepts without codable children: Secondary | ICD-10-CM | POA: Diagnosis not present

## 2014-03-19 DIAGNOSIS — Z9889 Other specified postprocedural states: Secondary | ICD-10-CM | POA: Insufficient documentation

## 2014-03-20 ENCOUNTER — Ambulatory Visit: Payer: BC Managed Care – PPO | Admitting: Physical Therapy

## 2014-03-21 ENCOUNTER — Ambulatory Visit: Payer: BC Managed Care – PPO | Admitting: Physical Therapy

## 2014-03-21 DIAGNOSIS — IMO0001 Reserved for inherently not codable concepts without codable children: Secondary | ICD-10-CM | POA: Diagnosis not present

## 2014-03-27 ENCOUNTER — Ambulatory Visit: Payer: BC Managed Care – PPO | Admitting: Physical Therapy

## 2014-03-27 DIAGNOSIS — IMO0001 Reserved for inherently not codable concepts without codable children: Secondary | ICD-10-CM | POA: Diagnosis not present

## 2014-03-29 ENCOUNTER — Ambulatory Visit: Payer: BC Managed Care – PPO | Admitting: Physical Therapy

## 2014-03-29 DIAGNOSIS — IMO0001 Reserved for inherently not codable concepts without codable children: Secondary | ICD-10-CM | POA: Diagnosis not present

## 2014-04-09 ENCOUNTER — Other Ambulatory Visit: Payer: Self-pay | Admitting: Obstetrics and Gynecology

## 2014-04-10 LAB — CYTOLOGY - PAP

## 2014-05-09 ENCOUNTER — Other Ambulatory Visit: Payer: Self-pay | Admitting: Family Medicine

## 2014-05-09 NOTE — Telephone Encounter (Signed)
Last OV 12-13-13 Med filled 12-31-13 #30 with 1  [facility] on file, needs uds

## 2014-05-09 NOTE — Telephone Encounter (Signed)
Med filled and faxed.  

## 2014-07-19 ENCOUNTER — Encounter: Payer: Self-pay | Admitting: Internal Medicine

## 2014-07-29 ENCOUNTER — Telehealth: Payer: Self-pay | Admitting: General Practice

## 2014-07-29 MED ORDER — ALPRAZOLAM 0.5 MG PO TABS: ORAL_TABLET | ORAL | Status: DC

## 2014-07-29 NOTE — Telephone Encounter (Signed)
Ok for #30, 1 refill 

## 2014-07-29 NOTE — Telephone Encounter (Signed)
Last oV 12/13/13 (calf pain) Alprazolam last filled 05-09-14 #30 with 1

## 2014-07-29 NOTE — Telephone Encounter (Signed)
Med filled and faxed.  

## 2014-08-02 ENCOUNTER — Other Ambulatory Visit: Payer: Self-pay | Admitting: General Practice

## 2014-08-02 MED ORDER — OMEPRAZOLE 20 MG PO CPDR: DELAYED_RELEASE_CAPSULE | ORAL | Status: DC

## 2014-10-09 ENCOUNTER — Telehealth: Payer: Self-pay | Admitting: *Deleted

## 2014-10-09 MED ORDER — ALPRAZOLAM 0.5 MG PO TABS: ORAL_TABLET | ORAL | Status: DC

## 2014-10-09 NOTE — Telephone Encounter (Signed)
Ok for #30, 1 refill 

## 2014-10-09 NOTE — Telephone Encounter (Signed)
Med filled.  

## 2014-10-09 NOTE — Telephone Encounter (Signed)
Last office visit- 12/13/13 (pain in calf)  Xanax last filled- 07/29/14 30 with 1  Low risk, UDS due

## 2014-10-11 LAB — HM MAMMOGRAPHY: HM Mammogram: NORMAL (ref 0–4)

## 2014-10-28 ENCOUNTER — Telehealth: Payer: Self-pay | Admitting: Family Medicine

## 2014-10-28 NOTE — Telephone Encounter (Signed)
Reviewed

## 2014-10-28 NOTE — Telephone Encounter (Signed)
[facility] - Client TELEPHONE ADVICE RECORD [facility] Patient Name: Melanie RichtersULA Groner DOB: [birth year] Initial Comment Caller says that she has coughing, and has had a fever on and off. Temp is 101.5 Nurse Assessment Nurse: Lane HackerHarley, RN, Windy Date/Time (Eastern Time): 10/28/2014 3:54:42 PM Confirm and document reason for call. If symptomatic, describe symptoms. ---Caller says that she has slight runny nose, productive coughing, and has had a fever on and off. Fever today of 101.5, and feels very achy. Hard to take a DB w/o coughing. S/S started today. Has the patient traveled out of the country within the last 30 days? ---Not Applicable Does the patient require triage? ---Yes Related visit to physician within the last 2 weeks? ---No Does the PT have any chronic conditions? (i.e. diabetes, asthma, etc.) ---Yes List chronic conditions. ---Acid Reflux - Omeprazole; OCP Did the patient indicate they were pregnant? ---No Guidelines Guideline Title Affirmed Question Affirmed Notes Influenza - Seasonal [1] Probable influenza (fever) with no complications AND [2] NOT HIGH RISK (all triage questions negative) Final Disposition User Home Care GertyHarley, RN, GeorgiaWindy Comments Caller asked about Tamiflu, and RN advised per guideline and her doctor that she is low risk and so treating s/s is preferred vs. prescribing Tamiflu. Also the Tamiflu has significant s.e. including vomiting, severe abdominal pain, hallucinations to name a few, and it limits the flu by only 1 & 1/2 days. Caller agreeable to follow the home care advice at this time.

## 2014-10-29 ENCOUNTER — Telehealth: Payer: Self-pay | Admitting: *Deleted

## 2014-10-29 NOTE — Telephone Encounter (Signed)
[facility] Fax received stating:   Caller states that she has been coughing, and she has had a fever on and off.  Temp of 101.5 today, and feels very achy.  Hard to take a deep breath without coughing.  S/s started today.   Disposition: Home Care, appointment not necessary  Called patient to check on symptoms.  Patient stated she is feeling about the same, and that fever comes and goes.  She is at home with her husband, is drinking plenty of fluids, is able to still do ADL's, and is resting most of the day.  Patient states she will call if her symptoms worsen.

## 2014-10-30 ENCOUNTER — Ambulatory Visit: Payer: Self-pay | Admitting: Medical

## 2014-11-02 ENCOUNTER — Emergency Department (HOSPITAL_BASED_OUTPATIENT_CLINIC_OR_DEPARTMENT_OTHER): Payer: BLUE CROSS/BLUE SHIELD

## 2014-11-02 ENCOUNTER — Emergency Department (HOSPITAL_BASED_OUTPATIENT_CLINIC_OR_DEPARTMENT_OTHER)
Admission: EM | Admit: 2014-11-02 | Discharge: 2014-11-02 | Disposition: A | Discharge status: Home / Self Care | Payer: BLUE CROSS/BLUE SHIELD | Attending: Emergency Medicine | Admitting: Emergency Medicine

## 2014-11-02 ENCOUNTER — Encounter (HOSPITAL_BASED_OUTPATIENT_CLINIC_OR_DEPARTMENT_OTHER): Payer: Self-pay | Admitting: *Deleted

## 2014-11-02 DIAGNOSIS — Z8639 Personal history of other endocrine, nutritional and metabolic disease: Secondary | ICD-10-CM | POA: Insufficient documentation

## 2014-11-02 DIAGNOSIS — K219 Gastro-esophageal reflux disease without esophagitis: Secondary | ICD-10-CM | POA: Diagnosis not present

## 2014-11-02 DIAGNOSIS — R059 Cough, unspecified: Secondary | ICD-10-CM

## 2014-11-02 DIAGNOSIS — Z8739 Personal history of other diseases of the musculoskeletal system and connective tissue: Secondary | ICD-10-CM | POA: Diagnosis not present

## 2014-11-02 DIAGNOSIS — R05 Cough: Secondary | ICD-10-CM | POA: Diagnosis present

## 2014-11-02 DIAGNOSIS — Z79818 Long term (current) use of other agents affecting estrogen receptors and estrogen levels: Secondary | ICD-10-CM | POA: Insufficient documentation

## 2014-11-02 DIAGNOSIS — Z7951 Long term (current) use of inhaled steroids: Secondary | ICD-10-CM | POA: Insufficient documentation

## 2014-11-02 DIAGNOSIS — Z79899 Other long term (current) drug therapy: Secondary | ICD-10-CM | POA: Diagnosis not present

## 2014-11-02 DIAGNOSIS — R69 Illness, unspecified: Secondary | ICD-10-CM

## 2014-11-02 DIAGNOSIS — J111 Influenza due to unidentified influenza virus with other respiratory manifestations: Secondary | ICD-10-CM

## 2014-11-02 MED ORDER — HYDROCODONE-ACETAMINOPHEN 5-325 MG PO TABS: ORAL_TABLET | ORAL | Status: DC

## 2014-11-02 NOTE — ED Notes (Signed)
Cough and congestion since Monday. Yellow sputum. Fever. C/o sore chest from coughing.

## 2014-11-02 NOTE — Discharge Instructions (Signed)
Use nasal saline (you can try [employer] Hammer Simply Saline) at least 4 times a day, use saline 5-10 minutes before using the fluticasone (flonase) nasal spray  Do not use Afrin (Oxymetazoline)  You may try over the counter medication such as Mucinex or Sudafed decongestant.  Return to the emergency room for any worsening or concerning symptoms including fast breathing, heart racing, confusion, vomiting.  Rest, cover your mouth when you cough and wash your hands frequently.   Push fluids: water or Gatorade, do not drink any soda, juice or caffeinated beverages.  For fever and pain control you can take Motrin (ibuprofen) as follows: 400 mg (this is normally 2 over the counter pills) every 4 hours with food.  Do not return to work until a day after your fever breaks.   Take Vicodin for cough and pain control, do not drink alcohol, drive, care for children or do other critical tasks while taking Vicodin     Please follow with your primary care doctor in the next 2 days for a check-up. They must obtain records for further management.   Do not hesitate to return to the Emergency Department for any new, worsening or concerning symptoms.

## 2014-11-02 NOTE — ED Provider Notes (Signed)
CSN: [id number]     Arrival date & time 11/02/14  1243 History   First MD Initiated Contact with Patient 11/02/14 1251     Chief Complaint  Patient presents with  . Cough     (Consider location/radiation/quality/duration/timing/severity/associated sxs/prior Treatment) HPI   Melanie Taylor is a 49 y.o. female complaining of acute onset of dry cough, rhinorrhea, myalgia, diffuse global headache, fever (Tmax 101.9 C), chills , pleuritic chest pain x5 days ago.  Single episode of nonbloody, nonbilious, non-coffee ground appearing emesis 4 days ago. States cough is worse in the morning. She denies decreased urine output, decreased by mouth intake. States that she called her primary care physician and was told it is likely the flu, she's not been taking any pain medication at home. Fever resolved several days ago. Patient is otherwise healthy, denies pharyngitis, cervicalgia, abdominal pain, shortness of breath.    Past Medical History  Diagnosis Date  . Arthritis   . Allergy   . GERD (gastroesophageal reflux disease)   . Thyroid disease     hyperthyroidism  . IBS (irritable bowel syndrome)    Past Surgical History  Procedure Laterality Date  . Cesarean section  06/2003  . Knee surgery  2003  . Shoulder surgery  2010   Family History  Problem Relation Age of Onset  . Arthritis Mother   . Hypothyroidism Mother   . Diabetes Mother   . Alcohol abuse Father   . Heart attack Father   . Sudden death Father   . Diabetes Father   . Hypothyroidism Maternal Grandmother   . Cancer Maternal Grandfather     COLON  . Diabetes Other     GRANDFATHER   History  Substance Use Topics  . Smoking status: Never Smoker   . Smokeless tobacco: Never Used  . Alcohol Use: Yes     Comment: socially   OB History    No data available     Review of Systems  10 systems reviewed and found to be negative, except as noted in the HPI   Allergies  Review of patient's allergies indicates no known  allergies.  Home Medications   Prior to Admission medications   Medication Sig Start Date End Date Taking? Authorizing Provider  ALPRAZolam Prudy Feeler) 0.5 MG tablet TAKE 1 TABLET BY MOUTH 3 TIMES A DAY AS NEEDED FOR ANXIETY 10/09/14  Yes Sheliah Hatch, MD  ethynodiol-ethinyl estradiol Noni Saupe) 1-35 MG-MCG tablet Take 1 tablet by mouth daily.   Yes Historical Provider, MD  fluticasone Aleda Grana) 50 MCG/ACT nasal spray  04/25/12  Yes Historical Provider, MD  montelukast (SINGULAIR) 10 MG tablet  12/01/13  Yes Historical Provider, MD  acyclovir (ZOVIRAX) 800 MG tablet  04/25/12   Historical Provider, MD  HYDROcodone-acetaminophen (NORCO/VICODIN) 5-325 MG per tablet Take 1-2 tablets by mouth every 6 hours as needed for pain and/or cough. 11/02/14   Ciela Mahajan, PA-C  Olopatadine HCl (PATANASE NA) Place 2 sprays into the nose daily.    Historical Provider, MD  omeprazole (PRILOSEC) 20 MG capsule TAKE 1 CAPSULE (20 MG TOTAL) BY MOUTH DAILY. 08/02/14   Sheliah Hatch, MD  promethazine-dextromethorphan (PROMETHAZINE-DM) 6.25-15 MG/5ML syrup  11/24/13   Historical Provider, MD  SUMAtriptan (IMITREX) 100 MG tablet  09/13/13   Historical Provider, MD  ZOVIA 1/35E, 28, 1-35 MG-MCG tablet Take 1 tablet by mouth daily.  03/08/11   Historical Provider, MD   BP 122/77 mmHg  Pulse 85  Temp(Src) 98.1 F (36.7 C) (Oral)  Resp 18  Ht 5\' 2"  (1.575 m)  Wt 110 lb (49.896 kg)  BMI 20.11 kg/m2  SpO2 98%  LMP 10/12/2014 (Approximate) Physical Exam  Constitutional: She is oriented to person, place, and time. She appears well-developed and well-nourished. No distress.  HENT:  Head: Normocephalic.  No drooling or stridor. Posterior pharynx mildly erythematous no significant tonsillar hypertrophy. No exudate. Soft palate rises symmetrically. No TTP or induration under tongue.   No tenderness to palpation of frontal or bilateral maxillary sinuses.  No mucosal edema in the nares.  Bilateral tympanic  membranes with normal architecture and good light reflex.    Eyes: Conjunctivae and EOM are normal. Pupils are equal, round, and reactive to light.  Neck: Normal range of motion.  Cardiovascular: Normal rate, regular rhythm and intact distal pulses.   Pulmonary/Chest: Effort normal and breath sounds normal. No stridor. No respiratory distress. She has no wheezes. She has no rales. She exhibits no tenderness.  Abdominal: Soft. Bowel sounds are normal. She exhibits no distension and no mass. There is no tenderness. There is no rebound and no guarding.  Musculoskeletal: Normal range of motion.  Neurological: She is alert and oriented to person, place, and time.  Psychiatric: She has a normal mood and affect.  Nursing note and vitals reviewed.   ED Course  Procedures (including critical care time) Labs Review Labs Reviewed - No data to display  Imaging Review Dg Chest 2 View  11/02/2014   CLINICAL DATA:  Cough.  Congestion and fever.  Influenza.  EXAM: CHEST  2 VIEW  COMPARISON:  None.  FINDINGS: The heart size and mediastinal contours are within normal limits. Both lungs are clear. The visualized skeletal structures are unremarkable.  IMPRESSION: No active cardiopulmonary disease.   Electronically Signed   By: Herbie BaltimoreWalt  Liebkemann M.D.   On: 11/02/2014 13:15     EKG Interpretation None      MDM   Final diagnoses:  Cough  Influenza-like illness   Filed Vitals:   11/02/14 1246  BP: 122/77  Pulse: 85  Temp: 98.1 F (36.7 C)  TempSrc: Oral  Resp: 18  Height: 5\' 2"  (1.575 m)  Weight: 110 lb (49.896 kg)  SpO2: 98%    Melanie Taylor is a pleasant 49 y.o. female presenting with influenza-like illness onset 5 days ago. Worsening cough. Lung sounds are clear to auscultation bilaterally. Patient is saturating well on room air, no tachypnea or tachycardia. Chest x-ray is without infiltrate. We will write patient a prescription for cough suppressant she will follow with her primary care  physician this week.  Evaluation does not show pathology that would require ongoing emergent intervention or inpatient treatment. Pt is hemodynamically stable and mentating appropriately. Discussed findings and plan with patient/guardian, who agrees with care plan. All questions answered. Return precautions discussed and outpatient follow up given.   New Prescriptions   HYDROCODONE-ACETAMINOPHEN (NORCO/VICODIN) 5-325 MG PER TABLET    Take 1-2 tablets by mouth every 6 hours as needed for pain and/or cough.        Wynetta Emeryicole Layaan Mott, PA-C 11/02/14 1439  Hilario Quarryanielle S Ray, MD 11/05/14 [phone]

## 2014-11-07 ENCOUNTER — Emergency Department (HOSPITAL_BASED_OUTPATIENT_CLINIC_OR_DEPARTMENT_OTHER)
Admission: EM | Admit: 2014-11-07 | Discharge: 2014-11-07 | Disposition: A | Discharge status: Home / Self Care | Payer: BLUE CROSS/BLUE SHIELD | Attending: Emergency Medicine | Admitting: Emergency Medicine

## 2014-11-07 ENCOUNTER — Encounter (HOSPITAL_BASED_OUTPATIENT_CLINIC_OR_DEPARTMENT_OTHER): Payer: Self-pay | Admitting: *Deleted

## 2014-11-07 ENCOUNTER — Emergency Department (HOSPITAL_BASED_OUTPATIENT_CLINIC_OR_DEPARTMENT_OTHER): Payer: BLUE CROSS/BLUE SHIELD

## 2014-11-07 ENCOUNTER — Ambulatory Visit: Payer: BLUE CROSS/BLUE SHIELD | Admitting: Medical

## 2014-11-07 DIAGNOSIS — Z3202 Encounter for pregnancy test, result negative: Secondary | ICD-10-CM | POA: Diagnosis not present

## 2014-11-07 DIAGNOSIS — Z79899 Other long term (current) drug therapy: Secondary | ICD-10-CM | POA: Insufficient documentation

## 2014-11-07 DIAGNOSIS — R51 Headache: Secondary | ICD-10-CM | POA: Diagnosis present

## 2014-11-07 DIAGNOSIS — E876 Hypokalemia: Secondary | ICD-10-CM

## 2014-11-07 DIAGNOSIS — M1990 Unspecified osteoarthritis, unspecified site: Secondary | ICD-10-CM | POA: Insufficient documentation

## 2014-11-07 DIAGNOSIS — J209 Acute bronchitis, unspecified: Secondary | ICD-10-CM | POA: Diagnosis not present

## 2014-11-07 DIAGNOSIS — Z8719 Personal history of other diseases of the digestive system: Secondary | ICD-10-CM | POA: Diagnosis not present

## 2014-11-07 DIAGNOSIS — G4489 Other headache syndrome: Secondary | ICD-10-CM | POA: Diagnosis not present

## 2014-11-07 LAB — COMPREHENSIVE METABOLIC PANEL
ALT: 38 U/L — ABNORMAL HIGH (ref 0–35)
AST: 34 U/L (ref 0–37)
Albumin: 3.6 g/dL (ref 3.5–5.2)
Alkaline Phosphatase: 64 U/L (ref 39–117)
Anion gap: 2 — ABNORMAL LOW (ref 5–15)
BUN: 10 mg/dL (ref 6–23)
CO2: 28 mmol/L (ref 19–32)
Calcium: 8.6 mg/dL (ref 8.4–10.5)
Chloride: 105 mmol/L (ref 96–112)
Creatinine, Ser: 0.71 mg/dL (ref 0.50–1.10)
GFR calc Af Amer: >90 mL/min (ref >90)
GFR calc non Af Amer: >90 mL/min (ref >90)
Glucose, Bld: 116 mg/dL — ABNORMAL HIGH (ref 70–99)
Potassium: 3.1 mmol/L — ABNORMAL LOW (ref 3.5–5.1)
Sodium: 135 mmol/L (ref 135–145)
Total Bilirubin: 0.2 mg/dL — ABNORMAL LOW (ref 0.3–1.2)
Total Protein: 6.9 g/dL (ref 6.0–8.3)

## 2014-11-07 LAB — URINALYSIS, ROUTINE W REFLEX MICROSCOPIC
Bilirubin Urine: NEGATIVE
Glucose, UA: NEGATIVE mg/dL
Ketones, ur: NEGATIVE mg/dL
Leukocytes, UA: NEGATIVE
Nitrite: NEGATIVE
Protein, ur: NEGATIVE mg/dL
Specific Gravity, Urine: 1.008 (ref 1.005–1.030)
Urobilinogen, UA: 0.2 mg/dL (ref 0.0–1.0)
pH: 7.5 (ref 5.0–8.0)

## 2014-11-07 LAB — CBC WITH DIFFERENTIAL/PLATELET
Basophils Absolute: 0 10*3/uL (ref 0.0–0.1)
Basophils Relative: 0 % (ref 0–1)
Eosinophils Absolute: 0 10*3/uL (ref 0.0–0.7)
Eosinophils Relative: 0 % (ref 0–5)
HCT: 38.6 % (ref 36.0–46.0)
Hemoglobin: 12.9 g/dL (ref 12.0–15.0)
Lymphocytes Relative: 18 % (ref 12–46)
Lymphs Abs: 1.3 10*3/uL (ref 0.7–4.0)
MCH: 30 pg (ref 26.0–34.0)
MCHC: 33.4 g/dL (ref 30.0–36.0)
MCV: 89.8 fL (ref 78.0–100.0)
Monocytes Absolute: 0.5 10*3/uL (ref 0.1–1.0)
Monocytes Relative: 7 % (ref 3–12)
Neutro Abs: 5.3 10*3/uL (ref 1.7–7.7)
Neutrophils Relative %: 75 % (ref 43–77)
Platelets: 371 10*3/uL (ref 150–400)
RBC: 4.3 MIL/uL (ref 3.87–5.11)
RDW: 12.5 % (ref 11.5–15.5)
WBC: 7.1 10*3/uL (ref 4.0–10.5)

## 2014-11-07 LAB — URINE MICROSCOPIC-ADD ON

## 2014-11-07 LAB — PREGNANCY, URINE: Preg Test, Ur: NEGATIVE

## 2014-11-07 MED ORDER — KETOROLAC TROMETHAMINE 30 MG/ML IJ SOLN
30.0000 mg | Freq: Once | INTRAMUSCULAR | Status: AC
  Administered 2014-11-07: 30 mg via INTRAVENOUS
  Filled 2014-11-07: qty 1

## 2014-11-07 MED ORDER — DEXAMETHASONE SODIUM PHOSPHATE 10 MG/ML IJ SOLN
10.0000 mg | Freq: Once | INTRAMUSCULAR | Status: AC
  Administered 2014-11-07: 10 mg via INTRAVENOUS
  Filled 2014-11-07: qty 1

## 2014-11-07 MED ORDER — SODIUM CHLORIDE 0.9 % IV BOLUS (SEPSIS)
1000.0000 mL | Freq: Once | INTRAVENOUS | Status: AC
  Administered 2014-11-07: 1000 mL via INTRAVENOUS

## 2014-11-07 MED ORDER — AZITHROMYCIN 250 MG PO TABS: 250.0000 mg | ORAL_TABLET | Freq: Every day | ORAL | Status: DC

## 2014-11-07 MED ORDER — METOCLOPRAMIDE HCL 5 MG/ML IJ SOLN
10.0000 mg | Freq: Once | INTRAMUSCULAR | Status: AC
  Administered 2014-11-07: 10 mg via INTRAVENOUS
  Filled 2014-11-07: qty 2

## 2014-11-07 MED ORDER — POTASSIUM CHLORIDE CRYS ER 20 MEQ PO TBCR
40.0000 meq | EXTENDED_RELEASE_TABLET | Freq: Once | ORAL | Status: AC
  Administered 2014-11-07: 40 meq via ORAL
  Filled 2014-11-07: qty 2

## 2014-11-07 MED ORDER — DIPHENHYDRAMINE HCL 50 MG/ML IJ SOLN
50.0000 mg | Freq: Once | INTRAMUSCULAR | Status: AC
  Administered 2014-11-07: 50 mg via INTRAVENOUS
  Filled 2014-11-07: qty 1

## 2014-11-07 NOTE — ED Notes (Signed)
She was seen here 5 days ago for cough and had a negative CXR. She is here with no improvement with cough. Now she has a headache. Recent flu.

## 2014-11-07 NOTE — ED Provider Notes (Signed)
TIME SEEN: 2:50 PM  CHIEF COMPLAINT: Headache, sinus congestion, cough  HPI: Pt is a 49 y.o. female with history of GERD, hyperthyroidism who presents to the emergency department with complaints of diffuse throbbing headache and feeling like "something is going to bust out of my head", reduction of cough with yellow/green sputum production, body aches. States that on January 18 she began feeling ill and had fever, body aches, cough, rhinorrhea. She called her primary care physician who told her that she likely had the flu. She did not start Tamiflu. She states she has been taking Tylenol and ibuprofen without relief. She was seen in the emergency department on 11/02/14 for similar complaints. That time she had a negative chest x-ray.  She was discharge with prescription for Vicodin to take at home as needed for pain, cough. She states she is back today because she is having increasing headache. Her last fever was 4 days ago. She's had nausea but no vomiting. No head injury. No numbness, tingling or focal weakness. Does complain of some neck pain but no neck stiffness. Not on anticoagulation. No sick contacts or recent travel. No diarrhea. No rash. States she has had a history of headaches in the past but reports this feels different. Describes as a gradual onset headache that started in her right temple then moved to her left temple and is now diffuse and throbbing. No onset, worst headache of her life. Tried taking 2 Tylenol earlier today without any relief. States that she had a episode of coughing which made her headache worse and she states her headache felt so bad that she thought she may pass out. Denies any chest pain or shortness of breath.  ROS: See HPI Constitutional: no fever  Eyes: no drainage  ENT: no runny nose   Cardiovascular:  no chest pain  Resp: no SOB  GI: no vomiting GU: no dysuria Integumentary: no rash  Allergy: no hives  Musculoskeletal: no leg swelling  Neurological: no  slurred speech ROS otherwise negative  PAST MEDICAL HISTORY/PAST SURGICAL HISTORY:  Past Medical History  Diagnosis Date  . Arthritis   . Allergy   . GERD (gastroesophageal reflux disease)   . Thyroid disease     hyperthyroidism  . IBS (irritable bowel syndrome)     MEDICATIONS:  Prior to Admission medications   Medication Sig Start Date End Date Taking? Authorizing Provider  acyclovir (ZOVIRAX) 800 MG tablet  04/25/12   Historical Provider, MD  ALPRAZolam Prudy Feeler(XANAX) 0.5 MG tablet TAKE 1 TABLET BY MOUTH 3 TIMES A DAY AS NEEDED FOR ANXIETY 10/09/14   Sheliah HatchKatherine E Tabori, MD  ethynodiol-ethinyl estradiol Noni Saupe(KELNOR,ZOVIA) 1-35 MG-MCG tablet Take 1 tablet by mouth daily.    Historical Provider, MD  fluticasone Aleda Grana(FLONASE) 50 MCG/ACT nasal spray  04/25/12   Historical Provider, MD  HYDROcodone-acetaminophen (NORCO/VICODIN) 5-325 MG per tablet Take 1-2 tablets by mouth every 6 hours as needed for pain and/or cough. 11/02/14   Nicole Pisciotta, PA-C  montelukast (SINGULAIR) 10 MG tablet  12/01/13   Historical Provider, MD  Olopatadine HCl (PATANASE NA) Place 2 sprays into the nose daily.    Historical Provider, MD  omeprazole (PRILOSEC) 20 MG capsule TAKE 1 CAPSULE (20 MG TOTAL) BY MOUTH DAILY. 08/02/14   Sheliah HatchKatherine E Tabori, MD  promethazine-dextromethorphan (PROMETHAZINE-DM) 6.25-15 MG/5ML syrup  11/24/13   Historical Provider, MD  SUMAtriptan (IMITREX) 100 MG tablet  09/13/13   Historical Provider, MD  ZOVIA 1/35E, 28, 1-35 MG-MCG tablet Take 1 tablet by mouth daily.  03/08/11   Historical Provider, MD    ALLERGIES:  No Known Allergies  SOCIAL HISTORY:  History  Substance Use Topics  . Smoking status: Never Smoker   . Smokeless tobacco: Never Used  . Alcohol Use: Yes     Comment: socially    FAMILY HISTORY: Family History  Problem Relation Age of Onset  . Arthritis Mother   . Hypothyroidism Mother   . Diabetes Mother   . Alcohol abuse Father   . Heart attack Father   . Sudden death  Father   . Diabetes Father   . Hypothyroidism Maternal Grandmother   . Cancer Maternal Grandfather     COLON  . Diabetes Other     GRANDFATHER    EXAM: BP 151/81 mmHg  Pulse 70  Temp(Src) 98.1 F (36.7 C) (Oral)  Resp 16  Ht  (1.575 m)  Wt 113 lb (51.256 kg)  BMI 20.66 kg/m2  SpO2 100%  LMP 10/12/2014 (Approximate) CONSTITUTIONAL: Alert and oriented and responds appropriately to questions. Well-appearing; well-nourished, appears uncomfortable but is nontoxic HEAD: Normocephalic EYES: Conjunctivae clear, PERRL, extraocular movements intact ENT: normal nose; no rhinorrhea; moist mucous membranes; pharynx without lesions noted, no tonsillar hypertrophy or exudate NECK: Supple, no meningismus, no LAD; full range of motion in her neck CARD: RRR; S1 and S2 appreciated; no murmurs, no clicks, no rubs, no gallops RESP: Normal chest excursion without splinting or tachypnea; breath sounds equal bilaterally; no wheezes, no rhonchi, no rales, no hypoxia the patient does have some rhonchorous breath sounds appreciated in the right posterior side  ABD/GI: Normal bowel sounds; non-distended; soft, non-tender, no rebound, no guarding BACK:  The back appears normal and is non-tender to palpation, there is no CVA tenderness EXT: Normal ROM in all joints; non-tender to palpation; no edema; normal capillary refill; no cyanosis    SKIN: Normal color for age and race; warm, no rash NEURO: Moves all extremities equally, sensation to light touch intact diffusely, cranial nerves II through XII intact, strength 5/5 in all 4 extremities  PSYCH: The patient's mood and manner are appropriate. Grooming and personal hygiene are appropriate.  MEDICAL DECISION MAKING: Patient here with flulike symptoms. Her fever is now gone but she is still complaining of headache, sinus pressure. She is neurologically intact, afebrile without meningismus. She does appear to have some asymmetric breath sounds on exam. Will  obtain basic labs, urinalysis, chest x-ray. We'll give Toradol, IV fluids, Decadron, Reglan and Benadryl and reassess. I have very low suspicion for meningitis or encephalitis. Very low suspicion for subarachnoid hemorrhage. I do not feel she needs a lumbar puncture or head CT.  ED PROGRESS: Patient's workup is still pending. Signed out to Dr. Bebe Shaggy.      EKG Interpretation  Date/Time:  Thursday November 07 2014 14:58:31 EST Ventricular Rate:  60 PR Interval:  148 QRS Duration: 82 QT Interval:  418 QTC Calculation: 418 R Axis:   61 Text Interpretation:  Normal sinus rhythm Normal ECG No old tracing to compare Confirmed by Brandonn Capelli,  DO, Keneshia Tena [phone]) on 11/07/2014 3:00:19 PM        Layla Maw Ranger Petrich, DO 11/08/14 1730

## 2014-11-07 NOTE — ED Notes (Signed)
PO fluids provided. 

## 2014-11-07 NOTE — ED Notes (Signed)
MD at bedside. 

## 2014-11-07 NOTE — ED Notes (Signed)
D/c home with family- directed to pharmacy to pick up meds 

## 2014-11-07 NOTE — ED Provider Notes (Signed)
I assumed care at signout to f/u on labs/imaging Pt feels improved She is awake/alert, no distress She is ambulatory without distress No focal weakness noted Given continued cough I offered antibiotics Pt reports most of symptoms started after coughing Appropriate for d/c home BP 130/80 mmHg  Pulse 63  Temp(Src) 98.4 F (36.9 C) (Oral)  Resp 16  Ht 5\' 2"  (1.575 m)  Wt 113 lb (51.256 kg)  BMI 20.66 kg/m2  SpO2 98%  LMP 11/07/2014 We discussed strict return precautions   Joya Gaskinsonald W Linville Decarolis, MD 11/07/14 1649

## 2014-11-07 NOTE — Discharge Instructions (Signed)
You are having a headache. No specific cause was found today for your headache. It may have been a migraine or other cause of headache. Stress, anxiety, fatigue, and depression are common triggers for headaches. Your headache today does not appear to be life-threatening or require hospitalization, but often the exact cause of headaches is not determined in the emergency department. Therefore, follow-up with your doctor is very important to find out what may have caused your headache, and whether or not you need any further diagnostic testing or treatment. Sometimes headaches can appear benign (not harmful), but then more serious symptoms can develop which should prompt an immediate re-evaluation by your doctor or the emergency department.  SEEK MEDICAL ATTENTION IF:  You develop possible problems with medications prescribed.  The medications don't resolve your headache, if it recurs , or if you have multiple episodes of vomiting or can't take fluids. You have a change from the usual headache.  RETURN IMMEDIATELY IF you develop a sudden, severe headache or confusion, become poorly responsive or faint, develop a fever above 100.36F or problem breathing, have a change in speech, vision, swallowing, or understanding, or develop new weakness, numbness, tingling, incoordination, or have a seizure.    Acute Bronchitis Bronchitis is inflammation of the airways that extend from the windpipe into the lungs (bronchi). The inflammation often causes mucus to develop. This leads to a cough, which is the most common symptom of bronchitis.  In acute bronchitis, the condition usually develops suddenly and goes away over time, usually in a couple weeks. Smoking, allergies, and asthma can make bronchitis worse. Repeated episodes of bronchitis may cause further lung problems.  CAUSES Acute bronchitis is most often caused by the same virus that causes a cold. The virus can spread from person to person (contagious) through  coughing, sneezing, and touching contaminated objects. SIGNS AND SYMPTOMS   Cough.   Fever.   Coughing up mucus.   Body aches.   Chest congestion.   Chills.   Shortness of breath.   Sore throat.  DIAGNOSIS  Acute bronchitis is usually diagnosed through a physical exam. Your health care provider will also ask you questions about your medical history. Tests, such as chest X-rays, are sometimes done to rule out other conditions.  TREATMENT  Acute bronchitis usually goes away in a couple weeks. Oftentimes, no medical treatment is necessary. Medicines are sometimes given for relief of fever or cough. Antibiotic medicines are usually not needed but may be prescribed in certain situations. In some cases, an inhaler may be recommended to help reduce shortness of breath and control the cough. A cool mist vaporizer may also be used to help thin bronchial secretions and make it easier to clear the chest.  HOME CARE INSTRUCTIONS  Get plenty of rest.   Drink enough fluids to keep your urine clear or pale yellow (unless you have a medical condition that requires fluid restriction). Increasing fluids may help thin your respiratory secretions (sputum) and reduce chest congestion, and it will prevent dehydration.   Take medicines only as directed by your health care provider.  If you were prescribed an antibiotic medicine, finish it all even if you start to feel better.  Avoid smoking and secondhand smoke. Exposure to cigarette smoke or irritating chemicals will make bronchitis worse. If you are a smoker, consider using nicotine gum or skin patches to help control withdrawal symptoms. Quitting smoking will help your lungs heal faster.   Reduce the chances of another bout of acute bronchitis  by washing your hands frequently, avoiding people with cold symptoms, and trying not to touch your hands to your mouth, nose, or eyes.   Keep all follow-up visits as directed by your health care  provider.  SEEK MEDICAL CARE IF: Your symptoms do not improve after 1 week of treatment.  SEEK IMMEDIATE MEDICAL CARE IF:  You develop an increased fever or chills.   You have chest pain.   You have severe shortness of breath.  You have bloody sputum.   You develop dehydration.  You faint or repeatedly feel like you are going to pass out.  You develop repeated vomiting.  You develop a severe headache. MAKE SURE YOU:   Understand these instructions.  Will watch your condition.  Will get help right away if you are not doing well or get worse. Document Released: 11/04/2004 Document Revised: 02/11/2014 Document Reviewed: 03/20/2013 [facility] Patient Information 2015 [employer], [place]. This information is not intended to replace advice given to you by your health care provider. Make sure you discuss any questions you have with your health care provider.

## 2014-12-02 ENCOUNTER — Other Ambulatory Visit: Payer: Self-pay | Admitting: General Practice

## 2014-12-02 MED ORDER — OMEPRAZOLE 20 MG PO CPDR: DELAYED_RELEASE_CAPSULE | ORAL | Status: DC

## 2014-12-02 NOTE — Telephone Encounter (Signed)
Last OV 12-13-13 (calf pain), no upcoming apts Omeprazole last filled 08-02-14 #30 with 2

## 2014-12-30 ENCOUNTER — Telehealth: Payer: Self-pay | Admitting: General Practice

## 2014-12-30 MED ORDER — ALPRAZOLAM 0.5 MG PO TABS: ORAL_TABLET | ORAL | Status: DC

## 2014-12-30 NOTE — Telephone Encounter (Signed)
Refill granted in PCPs absent. Rx printed, signed and faxed to pharmacy.

## 2014-12-30 NOTE — Telephone Encounter (Signed)
Last OV 12-13-13 (calf pain) Alprazolam last filled 10-09-14 #30 with 1  [facility]SC on file, Low risk

## 2015-03-11 ENCOUNTER — Encounter: Payer: Self-pay | Admitting: General Practice

## 2015-03-11 ENCOUNTER — Other Ambulatory Visit: Payer: Self-pay | Admitting: General Practice

## 2015-03-11 MED ORDER — OMEPRAZOLE 20 MG PO CPDR: DELAYED_RELEASE_CAPSULE | ORAL | Status: DC

## 2015-04-24 ENCOUNTER — Other Ambulatory Visit: Payer: Self-pay | Admitting: Physician Assistant

## 2015-04-24 NOTE — Telephone Encounter (Signed)
Last filled: 12/30/14 Amt: 30, 1 refill Last OV:  12/13/13 Next appt: 08/19/15 Contract on file UDS:  LOW risk   Please advise.

## 2015-06-30 ENCOUNTER — Telehealth: Payer: Self-pay

## 2015-06-30 ENCOUNTER — Ambulatory Visit (INDEPENDENT_AMBULATORY_CARE_PROVIDER_SITE_OTHER): Payer: BLUE CROSS/BLUE SHIELD | Admitting: Physician Assistant

## 2015-06-30 ENCOUNTER — Encounter: Payer: Self-pay | Admitting: Physician Assistant

## 2015-06-30 ENCOUNTER — Ambulatory Visit (HOSPITAL_BASED_OUTPATIENT_CLINIC_OR_DEPARTMENT_OTHER)
Admission: RE | Admit: 2015-06-30 | Discharge: 2015-06-30 | Disposition: A | Discharge status: Home / Self Care | Payer: BLUE CROSS/BLUE SHIELD | Source: Ambulatory Visit | Attending: Physician Assistant | Admitting: Physician Assistant

## 2015-06-30 VITALS — BP 141/86 | HR 75 | Temp 98.7°F | Resp 12 | Ht 62.0 in | Wt 121.8 lb

## 2015-06-30 DIAGNOSIS — R079 Chest pain, unspecified: Secondary | ICD-10-CM | POA: Insufficient documentation

## 2015-06-30 DIAGNOSIS — R0789 Other chest pain: Secondary | ICD-10-CM | POA: Diagnosis not present

## 2015-06-30 MED ORDER — VENLAFAXINE HCL ER 37.5 MG PO CP24: 37.5000 mg | ORAL_CAPSULE | Freq: Every day | ORAL | Status: DC

## 2015-06-30 NOTE — Telephone Encounter (Signed)
Patient states she has had chest pain for a few weeks now. Chest pain is located on the upper L side. Denies SOB. Denies pain in either arm. States she noticed palpitation a couple days ago. States deep breaths cause pain. Does not want to go to ER but because her father dies of sudden massive heart atatck,she wants to be checked out.

## 2015-06-30 NOTE — Assessment & Plan Note (Signed)
EKG with NSR. Vitals are stable. Clearly anxious and most likely cause of current symptoms. Will add-on Effexor XR 37.5 mg daily to her Xanax prn. Continue other medications. Will obtain CXR and EST due to family history. Follow-up 2-3 weeks.

## 2015-06-30 NOTE — Telephone Encounter (Signed)
Pt needs appt for evaluation.  I would recommend she be seen ASAP due to her L sided chest pain and family hx.  I do not have any appts but she can see another provider with openings for an evaluation of her current sxs

## 2015-06-30 NOTE — Progress Notes (Signed)
Patient presents to clinic today c/o intermittent episodes of central chest pressure or aching and usually associated with racing heart.  Episodes are sporadic and usually last 30 seconds to a couple of minutes. Denies SOB, lightheadedness or dizziness. Endorses + Family history of early onset CAD with father passing away in mid 50s from a heart attack. Denies personal history of hypertension or hyperlipidemia. Does have history of anxiety with daily symptoms. Endorses taking Xanax with acute anxiety, but very sporadically. Patient is exercising daily without chest pain or SOB.  Past Medical History  Diagnosis Date  . Arthritis   . Allergy   . GERD (gastroesophageal reflux disease)   . Thyroid disease     hyperthyroidism  . IBS (irritable bowel syndrome)     Current Outpatient Prescriptions on File Prior to Visit  Medication Sig Dispense Refill  . ALPRAZolam (XANAX) 0.5 MG tablet TAKE 1 TABLET BY MOUTH 3 TIMES A DAY AS NEEDED FOR ANXIETY 30 tablet 1  . ethynodiol-ethinyl estradiol (KELNOR,ZOVIA) 1-35 MG-MCG tablet Take 1 tablet by mouth daily.    . fluticasone (FLONASE) 50 MCG/ACT nasal spray     . omeprazole (PRILOSEC) 20 MG capsule TAKE 1 CAPSULE (20 MG TOTAL) BY MOUTH DAILY. 30 capsule 2  . ZOVIA 1/35E, 28, 1-35 MG-MCG tablet Take 1 tablet by mouth daily.     Marland Kitchen acyclovir (ZOVIRAX) 800 MG tablet     . HYDROcodone-acetaminophen (NORCO/VICODIN) 5-325 MG per tablet Take 1-2 tablets by mouth every 6 hours as needed for pain and/or cough. (Patient not taking: Reported on 06/30/2015) 7 tablet 0  . promethazine-dextromethorphan (PROMETHAZINE-DM) 6.25-15 MG/5ML syrup     . SUMAtriptan (IMITREX) 100 MG tablet      No current facility-administered medications on file prior to visit.    No Known Allergies  Family History  Problem Relation Age of Onset  . Arthritis Mother   . Hypothyroidism Mother   . Diabetes Mother   . Alcohol abuse Father   . Heart attack Father   . Sudden death  Father   . Diabetes Father   . Hypothyroidism Maternal Grandmother   . Cancer Maternal Grandfather     COLON  . Diabetes Other     GRANDFATHER    Social History   Social History  . Marital Status: Married    Spouse Name: N/A  . Number of Children: N/A  . Years of Education: N/A   Social History Main Topics  . Smoking status: Never Smoker   . Smokeless tobacco: Never Used  . Alcohol Use: Yes     Comment: socially  . Drug Use: No  . Sexual Activity: Not on file   Other Topics Concern  . Not on file   Social History Narrative   [id number]   Review of Systems - See HPI.  All other ROS are negative.  BP 141/86 mmHg  Pulse 75  Temp(Src) 98.7 F (37.1 C) (Oral)  Resp 12  Ht  (1.575 m)  Wt 121 lb 12.8 oz (55.248 kg)  BMI 22.27 kg/m2  SpO2 100%  Physical Exam  Constitutional: She is oriented to person, place, and time and well-developed, well-nourished, and in no distress.  HENT:  Head: Normocephalic and atraumatic.  Eyes: Conjunctivae are normal.  Cardiovascular: Normal rate, regular rhythm, normal heart sounds and intact distal pulses.   Pulmonary/Chest: Effort normal and breath sounds normal. No respiratory distress. She has no wheezes. She has no rales. She exhibits no tenderness.  Neurological: She is  alert and oriented to person, place, and time.  Skin: Skin is warm and dry. No rash noted.  Psychiatric: Affect normal.  Vitals reviewed.  Assessment/Plan: Atypical chest pain EKG with NSR. Vitals are stable. Clearly anxious and most likely cause of current symptoms. Will add-on Effexor XR 37.5 mg daily to her Xanax prn. Continue other medications. Will obtain CXR and EST due to family history. Follow-up 2-3 weeks.

## 2015-06-30 NOTE — Patient Instructions (Signed)
Please start the Effexor daily to help with anxiety. Continue alprazolam as directed.  Increase fluids and rest. Take time for yourself during the day to help reduce stress. Continue chronic medications as directed.  You will be contacted by Cardiology for a stress test. Follow-up with myself and Dr. Beverely Low in 3-4 weeks. Return immediately if anything worsens.

## 2015-06-30 NOTE — Telephone Encounter (Signed)
Appointment scheduled with [facility] for today at 11:15am per Dr. Beverely Low.

## 2015-06-30 NOTE — Progress Notes (Signed)
Pre visit review using our clinic review tool, if applicable. No additional management support is needed unless otherwise documented below in the visit note. 

## 2015-07-01 NOTE — Telephone Encounter (Signed)
Patient seen by [facility] 06/30/15

## 2015-07-02 ENCOUNTER — Telehealth: Payer: Self-pay | Admitting: *Deleted

## 2015-07-02 NOTE — Telephone Encounter (Signed)
Ok to switch 

## 2015-07-02 NOTE — Telephone Encounter (Signed)
Patient had appointment with Selena Batten on Monday, 06/30/15 and is requesting to change providers, as she will be unable to make the change to [place]/[facility] Please Advise.

## 2015-07-02 NOTE — Telephone Encounter (Signed)
Has to have permission from Dr. Beverely Low but fine with me.

## 2015-07-03 ENCOUNTER — Telehealth: Payer: Self-pay | Admitting: Family Medicine

## 2015-07-04 ENCOUNTER — Telehealth: Payer: Self-pay | Admitting: Family Medicine

## 2015-07-04 NOTE — Telephone Encounter (Signed)
Ok per providers to change PCP; Please call patient to schedule CPE with fasting labs with [place] Thanks.

## 2015-07-04 NOTE — Telephone Encounter (Signed)
Caller name: Emaan Gary  Relationship to patient: Self   Can be reached: [phone]  Pharmacy:  Reason for call: Pt says that the medication venlafaxine XR (EFFEXOR XR) makes her nauseous. She says that she didn't take it today because of that. She wants to know of any suggestions for taking it that will help keep it down and not cause nausea. She also informed me that she did take the med with food.    Please advise.

## 2015-07-04 NOTE — Telephone Encounter (Signed)
Can happen while body is adjusting to medication. Recommend she try to take at bedtime instead. Will sleep through nausea until body adjusts and there are no more side effects. If this is not working we will need to change medication

## 2015-07-07 ENCOUNTER — Telehealth: Payer: Self-pay | Admitting: Family Medicine

## 2015-07-07 MED ORDER — OMEPRAZOLE 20 MG PO CPDR: DELAYED_RELEASE_CAPSULE | ORAL | Status: DC

## 2015-07-07 NOTE — Addendum Note (Signed)
Addended by: Geannie Risen on: 07/07/2015 11:57 AM   Modules accepted: Orders

## 2015-07-07 NOTE — Telephone Encounter (Signed)
Pt called in upset that refill is denied as she has cpe scheduled for 08/19/15. She said that was the earliest available when she called. She can move it up (if we have avail) because she cannot do without. Please call pt to advise [phone]

## 2015-07-07 NOTE — Telephone Encounter (Signed)
Med denied, pt has not been seen in over a year.  

## 2015-07-07 NOTE — Telephone Encounter (Signed)
Pharmacy: [id number] - [place], [place] - [address]

## 2015-07-07 NOTE — Telephone Encounter (Signed)
Med filled.  

## 2015-07-07 NOTE — Telephone Encounter (Signed)
Moved appt up to 07/18/15 for cpe

## 2015-07-07 NOTE — Telephone Encounter (Signed)
Pt also now stating she is not sure she will be changing providing. She liked Selena Batten a lot and she loves Dr. Beverely Low. She is torn.

## 2015-07-08 ENCOUNTER — Encounter: Payer: Self-pay | Admitting: Physician Assistant

## 2015-07-08 MED ORDER — FLUOXETINE HCL 20 MG PO TABS: 20.0000 mg | ORAL_TABLET | Freq: Every day | ORAL | Status: DC

## 2015-07-08 NOTE — Telephone Encounter (Addendum)
Relation to XB:JYNW Call back number:[phone] Pharmacy: [id number] - [place], [place] - [address] [phone] (Phone) [phone] (Fax)        Reason for call:  Patient stated venlafaxine XR (EFFEXOR XR) 37.5 MG 24 hr capsule is not working and states its keeping her up during the night and requesting an alternate.

## 2015-07-08 NOTE — Telephone Encounter (Signed)
Disregard last message. Patient has sent MyChart message and I am handling things through MyChart.

## 2015-07-08 NOTE — Telephone Encounter (Signed)
Stop Effexor. Start Fluoxetine 20 mg daily. Ok to send in Rx. Follow-up as scheduled.

## 2015-07-08 NOTE — Telephone Encounter (Signed)
See tel notes. Pt is torn on changing providers. Has CPE scheduled with Dr. Beverely Low 07/18/15.

## 2015-07-08 NOTE — Telephone Encounter (Signed)
Noted, provider informed/[facility]

## 2015-07-08 NOTE — Telephone Encounter (Signed)
There is no reason to be torn.  Pt just needs to make a decision and go with it.  Neither Melanie Taylor nor myself will be offended in her choice

## 2015-07-09 ENCOUNTER — Other Ambulatory Visit: Payer: Self-pay | Admitting: Obstetrics and Gynecology

## 2015-07-10 LAB — CYTOLOGY - PAP

## 2015-07-10 NOTE — Telephone Encounter (Signed)
error 

## 2015-07-16 ENCOUNTER — Telehealth: Payer: Self-pay

## 2015-07-16 NOTE — Telephone Encounter (Signed)
Pre visit call completed 

## 2015-07-18 ENCOUNTER — Ambulatory Visit (INDEPENDENT_AMBULATORY_CARE_PROVIDER_SITE_OTHER): Payer: BLUE CROSS/BLUE SHIELD | Admitting: Family Medicine

## 2015-07-18 ENCOUNTER — Encounter: Payer: Self-pay | Admitting: Family Medicine

## 2015-07-18 VITALS — BP 124/88 | HR 79 | Temp 97.9°F | Ht 62.0 in | Wt 119.6 lb

## 2015-07-18 DIAGNOSIS — Z0000 Encounter for general adult medical examination without abnormal findings: Secondary | ICD-10-CM

## 2015-07-18 DIAGNOSIS — Z23 Encounter for immunization: Secondary | ICD-10-CM

## 2015-07-18 LAB — HEPATIC FUNCTION PANEL
ALT: 20 U/L (ref 0–35)
AST: 22 U/L (ref 0–37)
Albumin: 4.2 g/dL (ref 3.5–5.2)
Alkaline Phosphatase: 58 U/L (ref 39–117)
Bilirubin, Direct: 0.1 mg/dL (ref 0.0–0.3)
Total Bilirubin: 0.6 mg/dL (ref 0.2–1.2)
Total Protein: 7.2 g/dL (ref 6.0–8.3)

## 2015-07-18 LAB — BASIC METABOLIC PANEL
BUN: 12 mg/dL (ref 6–23)
CO2: 31 mEq/L (ref 19–32)
Calcium: 9.4 mg/dL (ref 8.4–10.5)
Chloride: 101 mEq/L (ref 96–112)
Creatinine, Ser: 0.88 mg/dL (ref 0.40–1.20)
GFR: 73.24 mL/min (ref >60.00)
Glucose, Bld: 89 mg/dL (ref 70–99)
Potassium: 4.9 mEq/L (ref 3.5–5.1)
Sodium: 137 mEq/L (ref 135–145)

## 2015-07-18 LAB — CBC WITH DIFFERENTIAL/PLATELET
Basophils Absolute: 0.1 10*3/uL (ref 0.0–0.1)
Basophils Relative: 1.8 % (ref 0.0–3.0)
Eosinophils Absolute: 0 10*3/uL (ref 0.0–0.7)
Eosinophils Relative: 0.1 % (ref 0.0–5.0)
HCT: 44.7 % (ref 36.0–46.0)
Hemoglobin: 14.9 g/dL (ref 12.0–15.0)
Lymphocytes Relative: 27.9 % (ref 12.0–46.0)
Lymphs Abs: 1.4 10*3/uL (ref 0.7–4.0)
MCHC: 33.4 g/dL (ref 30.0–36.0)
MCV: 91.7 fl (ref 78.0–100.0)
Monocytes Absolute: 0.5 10*3/uL (ref 0.1–1.0)
Monocytes Relative: 9.4 % (ref 3.0–12.0)
Neutro Abs: 3.1 10*3/uL (ref 1.4–7.7)
Neutrophils Relative %: 60.8 % (ref 43.0–77.0)
Platelets: 325 10*3/uL (ref 150.0–400.0)
RBC: 4.87 Mil/uL (ref 3.87–5.11)
RDW: 13.7 % (ref 11.5–15.5)
WBC: 5.1 10*3/uL (ref 4.0–10.5)

## 2015-07-18 LAB — LIPID PANEL
Cholesterol: 214 mg/dL — ABNORMAL HIGH (ref 0–200)
HDL: 82.7 mg/dL (ref >39.00)
LDL Cholesterol: 117 mg/dL — ABNORMAL HIGH (ref 0–99)
NonHDL: 130.86
Total CHOL/HDL Ratio: 3
Triglycerides: 67 mg/dL (ref 0.0–149.0)
VLDL: 13.4 mg/dL (ref 0.0–40.0)

## 2015-07-18 LAB — TSH: TSH: 1.47 u[IU]/mL (ref 0.35–4.50)

## 2015-07-18 LAB — VITAMIN D 25 HYDROXY (VIT D DEFICIENCY, FRACTURES): VITD: 45.85 ng/mL (ref 30.00–100.00)

## 2015-07-18 NOTE — Patient Instructions (Signed)
Follow up in 6 months to recheck mood We'll notify you of your lab results and make any changes if needed Keep up the good work on healthy diet and regular exercise- you look great! Call with any questions or concerns If you want to join [place] at the new [place] office, any scheduled appointments will automatically transfer and we will see you at [address] [place] [address] [place], [place] [address]  Happy Fall!!!

## 2015-07-18 NOTE — Progress Notes (Signed)
Pre visit review using our clinic review tool, if applicable. No additional management support is needed unless otherwise documented below in the visit note. 

## 2015-07-18 NOTE — Assessment & Plan Note (Signed)
Pt's PE WNL.  UTD on GYN.  Check labs.  Flu shot given.  Anticipatory guidance provided.  

## 2015-07-18 NOTE — Progress Notes (Signed)
   Subjective:    Patient ID: Melanie Taylor, female    DOB: 29-Sep-1966, 49 y.o.   MRN: [id number]  HPI CPE- UTD on pap w/ GYN (Dr Dareen Piano).  Also had mammo done (9/28)   Review of Systems Patient reports no vision/ hearing changes, adenopathy,fever, weight change,  persistant/recurrent hoarseness , swallowing issues, chest pain, palpitations, edema, persistant/recurrent cough, hemoptysis, dyspnea (rest/exertional/paroxysmal nocturnal), gastrointestinal bleeding (melena, rectal bleeding), abdominal pain, significant heartburn, bowel changes, GU symptoms (dysuria, hematuria, incontinence), Gyn symptoms (abnormal  bleeding, pain),  syncope, focal weakness, memory loss, numbness & tingling, skin/hair/nail changes, abnormal bruising or bleeding, anxiety, or depression.     Objective:   Physical Exam General Appearance:    Alert, cooperative, no distress, appears stated age  Head:    Normocephalic, without obvious abnormality, atraumatic  Eyes:    PERRL, conjunctiva/corneas clear, EOM's intact, fundi    benign, both eyes  Ears:    Normal TM's and external ear canals, both ears  Nose:   Nares normal, septum midline, mucosa normal, no drainage    or sinus tenderness  Throat:   Lips, mucosa, and tongue normal; teeth and gums normal  Neck:   Supple, symmetrical, trachea midline, no adenopathy;    Thyroid: no enlargement/tenderness/nodules  Back:     Symmetric, no curvature, ROM normal, no CVA tenderness  Lungs:     Clear to auscultation bilaterally, respirations unlabored  Chest Wall:    No tenderness or deformity   Heart:    Regular rate and rhythm, S1 and S2 normal, no murmur, rub   or gallop  Breast Exam:    Deferred to GYN  Abdomen:     Soft, non-tender, bowel sounds active all four quadrants,    no masses, no organomegaly  Genitalia:    Deferred to GYN  Rectal:    Extremities:   Extremities normal, atraumatic, no cyanosis or edema  Pulses:   2+ and symmetric all extremities  Skin:    Skin color, texture, turgor normal, no rashes or lesions  Lymph nodes:   Cervical, supraclavicular, and axillary nodes normal  Neurologic:   CNII-XII intact, normal strength, sensation and reflexes    throughout          Assessment & Plan:

## 2015-08-14 ENCOUNTER — Encounter: Payer: BLUE CROSS/BLUE SHIELD | Admitting: Family Medicine

## 2015-08-19 ENCOUNTER — Encounter: Payer: BLUE CROSS/BLUE SHIELD | Admitting: Family Medicine

## 2015-08-25 ENCOUNTER — Other Ambulatory Visit: Payer: Self-pay | Admitting: Family Medicine

## 2015-08-25 NOTE — Telephone Encounter (Signed)
Last OV 07/18/15 Alprazolam last filled 04/24/15 #30 with 1

## 2015-08-25 NOTE — Telephone Encounter (Signed)
Medication filled to pharmacy as requested.   

## 2015-10-05 ENCOUNTER — Other Ambulatory Visit: Payer: Self-pay | Admitting: Family Medicine

## 2015-10-07 ENCOUNTER — Other Ambulatory Visit: Payer: Self-pay | Admitting: Family Medicine

## 2015-10-07 NOTE — Telephone Encounter (Signed)
Medication filled to pharmacy as requested.   

## 2015-10-07 NOTE — Telephone Encounter (Signed)
Last OV 07/18/15 Alprazolam last filled 08/25/15 #30 with 0

## 2015-11-06 ENCOUNTER — Other Ambulatory Visit: Payer: Self-pay | Admitting: Family Medicine

## 2015-11-07 NOTE — Telephone Encounter (Signed)
Medication filled to pharmacy as requested.   

## 2015-11-07 NOTE — Telephone Encounter (Signed)
Last OV 08/07/15 Alprazolam last filled 10/07/15 #30 with 0  Low risk, needs uds

## 2015-11-27 ENCOUNTER — Ambulatory Visit (INDEPENDENT_AMBULATORY_CARE_PROVIDER_SITE_OTHER): Payer: BLUE CROSS/BLUE SHIELD

## 2015-11-27 ENCOUNTER — Ambulatory Visit (INDEPENDENT_AMBULATORY_CARE_PROVIDER_SITE_OTHER): Payer: BLUE CROSS/BLUE SHIELD | Admitting: Sports Medicine

## 2015-11-27 VITALS — BP 127/68 | HR 78 | Resp 18 | Wt 122.3 lb

## 2015-11-27 DIAGNOSIS — M7062 Trochanteric bursitis, left hip: Secondary | ICD-10-CM | POA: Diagnosis not present

## 2015-11-27 NOTE — Assessment & Plan Note (Signed)
Aggressive treatment per patient request, injection, rehabilitation exercises, meloxicam, x-rays. Return to see me in one month, advised to do hip abductor machine in the gym.

## 2015-11-27 NOTE — Progress Notes (Signed)
   Subjective:    I'm seeing this patient as a consultation for: Dr. Neena Rhymes  CC: "my left hip hurts"  HPI: Patient presents with a 6 week history of left hip pain. She says that the pain is on the lateral side of her hip. It does not radiate and is not associated with any weakness, numbness, or tingling. She says that she notices the pain the most when laying on that side at night. She occasionally notices it with seemingly benign activities such as bending certain ways to load and unload the dishwasher. She has been able to continue exercising without significant limitation. She says rest makes it better and she has tried some OTC NSAIDs with variable results. She denies any preceding event or trauma or redness/swelling in the area.  Past medical history, Surgical history, Family history not pertinant except as noted below, Social history, Allergies, and medications have been entered into the medical record, reviewed, and no changes needed.   Review of Systems: No headache, visual changes, nausea, vomiting, diarrhea, constipation, dizziness, abdominal pain, skin rash, fevers, chills, night sweats, weight loss, swollen lymph nodes, body aches, joint swelling, muscle aches, chest pain, shortness of breath, mood changes, visual or auditory hallucinations.   Objective:   General: Well Developed, well nourished, and in no acute distress.  Neuro/Psych: Alert and oriented x3, extra-ocular muscles intact, able to move all 4 extremities, sensation grossly intact. Skin: Warm and dry, no rashes noted.  Respiratory: Not using accessory muscles, speaking in full sentences, trachea midline.  Cardiovascular: Pulses palpable, no extremity edema. Abdomen: Does not appear distended. Left Hip: ROM IR: 45 Deg, ER: 45 Deg, Flexion: 120 Deg, Extension: 100 Deg, Abduction: 45 Deg, Adduction: 45 Deg Strength IR: 5/5, ER: 5/5, Flexion: 5/5, Extension: 5/5, Abduction: 4/5, Adduction: 5/5 No pain on  resisted abduction or adduction. Pelvic alignment unremarkable to inspection and palpation. Standing hip rotation and gait without trendelenburg sign / unsteadiness. Greater trochanter with tenderness to palpation. No tenderness over piriformis. No pain with FABER or FADIR. Mild bilateral SI joint tenderness and normal minimal SI movement. Tenderness over the ASIS.  Procedure:  Injection of left greater trochanteric bursa Consent obtained and verified. Time-out conducted. Noted no overlying erythema, induration, or other signs of local infection. Skin prepped in a sterile fashion. Topical analgesic spray: Ethyl chloride. Completed without difficulty. Meds: Spinal needle advanced to the greater trochanter, contacted bone and withdrawn slightly, 1 mL kenalog 40, 2 mL lidocaine, 2 mL Marcaine injected easily. Pain immediately improved suggesting accurate placement of the medication. Advised to call if fevers/chills, erythema, induration, drainage, or persistent bleeding.  Impression and Recommendations:   This case required medical decision making of moderate complexity.  Patient's presentation is most likely greater trochanteric bursitis. Will inject today, per patient request. Will also Rx meloxicam and get x-rays to r/o occult pathology. Patient was also given home exercises to perform to strengthen abduction. Patient to return to clinic in one month.

## 2015-12-02 ENCOUNTER — Telehealth: Payer: Self-pay

## 2015-12-02 MED ORDER — MELOXICAM 15 MG PO TABS: ORAL_TABLET | ORAL | Status: DC

## 2015-12-02 NOTE — Telephone Encounter (Signed)
Calling in meloxicam. 

## 2015-12-02 NOTE — Telephone Encounter (Signed)
Pt left VM asking if she can have anti inflammatory. Please advise.

## 2015-12-03 NOTE — Telephone Encounter (Signed)
Pt.notified

## 2015-12-15 ENCOUNTER — Ambulatory Visit (INDEPENDENT_AMBULATORY_CARE_PROVIDER_SITE_OTHER): Payer: BLUE CROSS/BLUE SHIELD | Admitting: Family Medicine

## 2015-12-15 ENCOUNTER — Encounter: Payer: Self-pay | Admitting: Family Medicine

## 2015-12-15 VITALS — BP 120/80 | HR 72 | Temp 98.1°F | Resp 16 | Wt 121.1 lb

## 2015-12-15 DIAGNOSIS — J0110 Acute frontal sinusitis, unspecified: Secondary | ICD-10-CM

## 2015-12-15 MED ORDER — AMOXICILLIN 875 MG PO TABS: 875.0000 mg | ORAL_TABLET | Freq: Two times a day (BID) | ORAL | Status: DC

## 2015-12-15 NOTE — Progress Notes (Signed)
   Subjective:    Patient ID: Melanie Taylor, female    DOB: 10/22/1965, 50 y.o.   MRN: [id number]  HPI URI- pt went to [facility] after 2 weeks of cough.  Got abx and improved x3 weeks.  But then developed similar cough, fever, body aches and was treated w/ Tamiflu.  Yesterday developed sinus pain, pressure, tooth pain, HA.  No ear pain.  Mild cough- intermittently productive.  + sick contacts.   Review of Systems For ROS see HPI     Objective:   Physical Exam  Constitutional: She appears well-developed and well-nourished. No distress.  HENT:  Head: Normocephalic and atraumatic.  Right Ear: Tympanic membrane normal.  Left Ear: Tympanic membrane normal.  Nose: Mucosal edema and rhinorrhea present. Right sinus exhibits maxillary sinus tenderness and frontal sinus tenderness. Left sinus exhibits maxillary sinus tenderness and frontal sinus tenderness.  Mouth/Throat: Uvula is midline and mucous membranes are normal. Posterior oropharyngeal erythema present. No oropharyngeal exudate.  Eyes: Conjunctivae and EOM are normal. Pupils are equal, round, and reactive to light.  Neck: Normal range of motion. Neck supple.  Cardiovascular: Normal rate, regular rhythm and normal heart sounds.   Pulmonary/Chest: Effort normal and breath sounds normal. No respiratory distress. She has no wheezes.  Lymphadenopathy:    She has no cervical adenopathy.  Vitals reviewed.         Assessment & Plan:

## 2015-12-15 NOTE — Patient Instructions (Signed)
Follow up as needed Start Claritin or Zyrtec daily to decrease the seasonal allergy component Start the Amoxicillin twice daily- take w/ food Drink plenty of fluids Mucinex DM for cough and congestion REST! Call with any questions or concerns Hang in there!!!

## 2015-12-15 NOTE — Progress Notes (Signed)
Pre visit review using our clinic review tool, if applicable. No additional management support is needed unless otherwise documented below in the visit note. 

## 2015-12-15 NOTE — Assessment & Plan Note (Signed)
Pt's sxs and PE consistent w/ infxn.  Start abx.  Reviewed supportive care and red flags that should prompt return.  Pt expressed understanding and is in agreement w/ plan.  

## 2015-12-25 ENCOUNTER — Ambulatory Visit: Payer: BLUE CROSS/BLUE SHIELD | Admitting: Sports Medicine

## 2015-12-29 ENCOUNTER — Encounter: Payer: Self-pay | Admitting: Family Medicine

## 2015-12-29 MED ORDER — FLUTICASONE PROPIONATE 50 MCG/ACT NA SUSP: 2.0000 | Freq: Every day | NASAL | Status: DC

## 2015-12-29 NOTE — Telephone Encounter (Signed)
Medication filled to pharmacy as requested.   

## 2015-12-29 NOTE — Telephone Encounter (Signed)
Reviewed in PCP absence. Will defer to Dr. Beverely Lowabori due to side effects of Lomotil

## 2016-01-02 ENCOUNTER — Encounter: Payer: Self-pay | Admitting: Family Medicine

## 2016-01-02 ENCOUNTER — Ambulatory Visit (INDEPENDENT_AMBULATORY_CARE_PROVIDER_SITE_OTHER): Payer: BLUE CROSS/BLUE SHIELD | Admitting: Family Medicine

## 2016-01-02 VITALS — BP 118/78 | HR 68 | Temp 98.5°F | Resp 16 | Ht 62.0 in | Wt 121.5 lb

## 2016-01-02 DIAGNOSIS — K589 Irritable bowel syndrome without diarrhea: Secondary | ICD-10-CM

## 2016-01-02 MED ORDER — DICYCLOMINE HCL 20 MG PO TABS: 20.0000 mg | ORAL_TABLET | Freq: Three times a day (TID) | ORAL | Status: DC

## 2016-01-02 NOTE — Progress Notes (Signed)
   Subjective:    Patient ID: Melanie Taylor, female    DOB: 09-Feb-1966, 50 y.o.   MRN: [id number]  HPI IBS- ongoing issue for pt.  She had leftover Lomotil that she took w/ good results.  Pt reports she will have 'social anxiety' w/ large groups of people and 'I'll be in the bathroom all day'.  sxs will resolve prior to event starting but 'it's kinda ridiculous'.  Not interested in taking Immodium 'b/c I'm not sick'.  Pt reports she has had this 'for years'.     Review of Systems For ROS see HPI     Objective:   Physical Exam  Constitutional: She is oriented to person, place, and time. She appears well-developed and well-nourished. No distress.  HENT:  Head: Normocephalic and atraumatic.  MMM  Neck: Neck supple.  Cardiovascular: Normal rate, regular rhythm and intact distal pulses.   Pulmonary/Chest: Effort normal and breath sounds normal. No respiratory distress. She has no wheezes. She has no rales.  Abdominal: Soft. She exhibits no distension. There is no tenderness. There is no rebound.  Hyperactive BS  Lymphadenopathy:    She has no cervical adenopathy.  Neurological: She is alert and oriented to person, place, and time.  Skin: Skin is warm and dry.  Vitals reviewed.         Assessment & Plan:

## 2016-01-02 NOTE — Progress Notes (Signed)
Pre visit review using our clinic review tool, if applicable. No additional management support is needed unless otherwise documented below in the visit note. 

## 2016-01-02 NOTE — Patient Instructions (Signed)
Follow up as needed Start the Bentyl as needed for the irritable bowel (you can add Immodium as needed) Drink plenty of fluids Call with any questions or concerns Hang in there!!!

## 2016-01-04 NOTE — Assessment & Plan Note (Signed)
Ongoing issue for pt.  Diarrhea predominant- worse w/ stressful social situations.  Start bentyl.  Reviewed dietary and lifestyle modifications that may help.  Will follow.

## 2016-01-16 ENCOUNTER — Ambulatory Visit: Payer: BLUE CROSS/BLUE SHIELD | Admitting: Family Medicine

## 2016-02-07 DIAGNOSIS — M25511 Pain in right shoulder: Secondary | ICD-10-CM | POA: Diagnosis not present

## 2016-02-10 DIAGNOSIS — M25511 Pain in right shoulder: Secondary | ICD-10-CM | POA: Diagnosis not present

## 2016-02-16 ENCOUNTER — Other Ambulatory Visit: Payer: Self-pay | Admitting: General Practice

## 2016-02-16 MED ORDER — ALPRAZOLAM 0.5 MG PO TABS: ORAL_TABLET | ORAL | Status: DC

## 2016-02-16 NOTE — Telephone Encounter (Signed)
Medication filled to pharmacy as requested.   

## 2016-02-16 NOTE — Telephone Encounter (Signed)
Last OV 01/02/16 IBS Alprazolam last filled 11/07/15 #30 with 1

## 2016-03-03 ENCOUNTER — Other Ambulatory Visit: Payer: Self-pay

## 2016-03-03 ENCOUNTER — Other Ambulatory Visit: Payer: Self-pay | Admitting: General Practice

## 2016-03-03 MED ORDER — MELOXICAM 15 MG PO TABS: ORAL_TABLET | ORAL | Status: DC

## 2016-03-03 MED ORDER — OMEPRAZOLE 20 MG PO CPDR: DELAYED_RELEASE_CAPSULE | ORAL | Status: DC

## 2016-03-11 DIAGNOSIS — J029 Acute pharyngitis, unspecified: Secondary | ICD-10-CM | POA: Diagnosis not present

## 2016-03-11 DIAGNOSIS — J069 Acute upper respiratory infection, unspecified: Secondary | ICD-10-CM | POA: Diagnosis not present

## 2016-04-30 ENCOUNTER — Other Ambulatory Visit: Payer: Self-pay | Admitting: General Practice

## 2016-04-30 MED ORDER — DICYCLOMINE HCL 20 MG PO TABS: 20.0000 mg | ORAL_TABLET | Freq: Three times a day (TID) | ORAL | Status: DC

## 2016-05-03 ENCOUNTER — Other Ambulatory Visit: Payer: Self-pay | Admitting: General Practice

## 2016-05-03 MED ORDER — ALPRAZOLAM 0.5 MG PO TABS: ORAL_TABLET | ORAL | 1 refills | Status: DC

## 2016-05-03 NOTE — Telephone Encounter (Signed)
Last OV 01/02/16 (IBS) Alprazolam last filled 02/16/16 #30 with 1

## 2016-05-03 NOTE — Telephone Encounter (Signed)
Medication filled to pharmacy as requested.   

## 2016-06-24 ENCOUNTER — Other Ambulatory Visit: Payer: Self-pay | Admitting: Family Medicine

## 2016-06-30 DIAGNOSIS — M25511 Pain in right shoulder: Secondary | ICD-10-CM | POA: Diagnosis not present

## 2016-07-05 ENCOUNTER — Other Ambulatory Visit: Payer: Self-pay | Admitting: Family Medicine

## 2016-07-05 DIAGNOSIS — B372 Candidiasis of skin and nail: Secondary | ICD-10-CM | POA: Diagnosis not present

## 2016-07-05 DIAGNOSIS — E559 Vitamin D deficiency, unspecified: Secondary | ICD-10-CM | POA: Diagnosis not present

## 2016-07-05 DIAGNOSIS — K589 Irritable bowel syndrome without diarrhea: Secondary | ICD-10-CM | POA: Diagnosis not present

## 2016-07-05 DIAGNOSIS — R6882 Decreased libido: Secondary | ICD-10-CM | POA: Diagnosis not present

## 2016-07-05 DIAGNOSIS — R5383 Other fatigue: Secondary | ICD-10-CM | POA: Diagnosis not present

## 2016-07-06 NOTE — Telephone Encounter (Signed)
Medication filled to pharmacy as requested.   

## 2016-07-06 NOTE — Telephone Encounter (Signed)
Last OV 01/02/16 Alprazolam last filled 05/03/16 #30 with 0

## 2016-07-19 DIAGNOSIS — M25511 Pain in right shoulder: Secondary | ICD-10-CM | POA: Diagnosis not present

## 2016-07-26 DIAGNOSIS — M25511 Pain in right shoulder: Secondary | ICD-10-CM | POA: Diagnosis not present

## 2016-08-02 DIAGNOSIS — R5383 Other fatigue: Secondary | ICD-10-CM | POA: Diagnosis not present

## 2016-08-02 DIAGNOSIS — B372 Candidiasis of skin and nail: Secondary | ICD-10-CM | POA: Diagnosis not present

## 2016-08-02 DIAGNOSIS — R6882 Decreased libido: Secondary | ICD-10-CM | POA: Diagnosis not present

## 2016-08-02 DIAGNOSIS — K589 Irritable bowel syndrome without diarrhea: Secondary | ICD-10-CM | POA: Diagnosis not present

## 2016-08-04 ENCOUNTER — Telehealth: Payer: Self-pay | Admitting: Family Medicine

## 2016-08-04 NOTE — Telephone Encounter (Signed)
Ok to transfer. 

## 2016-08-04 NOTE — Telephone Encounter (Signed)
sure

## 2016-08-04 NOTE — Telephone Encounter (Signed)
Patient requesting a transfer to Dr. Patsy Lageropland based on location. Please advise.   Phone:[phone]

## 2016-08-04 NOTE — Telephone Encounter (Signed)
Patient aware. Appt scheduled for 09/16/16

## 2016-08-09 DIAGNOSIS — G8929 Other chronic pain: Secondary | ICD-10-CM | POA: Diagnosis not present

## 2016-08-09 DIAGNOSIS — M25511 Pain in right shoulder: Secondary | ICD-10-CM | POA: Diagnosis not present

## 2016-08-25 DIAGNOSIS — E0590 Thyrotoxicosis, unspecified without thyrotoxic crisis or storm: Secondary | ICD-10-CM | POA: Diagnosis not present

## 2016-08-25 DIAGNOSIS — Z23 Encounter for immunization: Secondary | ICD-10-CM | POA: Diagnosis not present

## 2016-08-28 ENCOUNTER — Other Ambulatory Visit: Payer: Self-pay | Admitting: Family Medicine

## 2016-09-07 DIAGNOSIS — L700 Acne vulgaris: Secondary | ICD-10-CM | POA: Diagnosis not present

## 2016-09-13 DIAGNOSIS — M25511 Pain in right shoulder: Secondary | ICD-10-CM | POA: Diagnosis not present

## 2016-09-13 DIAGNOSIS — G8929 Other chronic pain: Secondary | ICD-10-CM | POA: Diagnosis not present

## 2016-09-15 ENCOUNTER — Telehealth: Payer: Self-pay | Admitting: Behavioral Health

## 2016-09-15 ENCOUNTER — Encounter: Payer: Self-pay | Admitting: Behavioral Health

## 2016-09-15 NOTE — Telephone Encounter (Signed)
Pre-Visit Call completed with patient and chart updated.   Pre-Visit Info documented in Specialty Comments under [employer].    

## 2016-09-16 ENCOUNTER — Ambulatory Visit (INDEPENDENT_AMBULATORY_CARE_PROVIDER_SITE_OTHER): Payer: BLUE CROSS/BLUE SHIELD | Admitting: Family Medicine

## 2016-09-16 ENCOUNTER — Encounter: Payer: Self-pay | Admitting: Family Medicine

## 2016-09-16 VITALS — BP 106/76 | HR 75 | Temp 98.2°F | Resp 16 | Ht 61.0 in | Wt 126.6 lb

## 2016-09-16 DIAGNOSIS — Z130 Encounter for screening for diseases of the blood and blood-forming organs and certain disorders involving the immune mechanism: Secondary | ICD-10-CM | POA: Diagnosis not present

## 2016-09-16 DIAGNOSIS — Z13220 Encounter for screening for lipoid disorders: Secondary | ICD-10-CM

## 2016-09-16 DIAGNOSIS — E538 Deficiency of other specified B group vitamins: Secondary | ICD-10-CM | POA: Diagnosis not present

## 2016-09-16 DIAGNOSIS — Z131 Encounter for screening for diabetes mellitus: Secondary | ICD-10-CM | POA: Diagnosis not present

## 2016-09-16 DIAGNOSIS — K580 Irritable bowel syndrome with diarrhea: Secondary | ICD-10-CM | POA: Diagnosis not present

## 2016-09-16 LAB — COMPREHENSIVE METABOLIC PANEL
ALT: 24 U/L (ref 0–35)
AST: 23 U/L (ref 0–37)
Albumin: 4.3 g/dL (ref 3.5–5.2)
Alkaline Phosphatase: 83 U/L (ref 39–117)
BUN: 13 mg/dL (ref 6–23)
CO2: 31 mEq/L (ref 19–32)
Calcium: 9.4 mg/dL (ref 8.4–10.5)
Chloride: 102 mEq/L (ref 96–112)
Creatinine, Ser: 0.82 mg/dL (ref 0.40–1.20)
GFR: 79.07 mL/min (ref >60.00)
Glucose, Bld: 74 mg/dL (ref 70–99)
Potassium: 4.2 mEq/L (ref 3.5–5.1)
Sodium: 141 mEq/L (ref 135–145)
Total Bilirubin: 0.6 mg/dL (ref 0.2–1.2)
Total Protein: 7.2 g/dL (ref 6.0–8.3)

## 2016-09-16 LAB — CBC
HCT: 43.4 % (ref 36.0–46.0)
Hemoglobin: 14.6 g/dL (ref 12.0–15.0)
MCHC: 33.5 g/dL (ref 30.0–36.0)
MCV: 89.1 fl (ref 78.0–100.0)
Platelets: 338 10*3/uL (ref 150.0–400.0)
RBC: 4.87 Mil/uL (ref 3.87–5.11)
RDW: 13.7 % (ref 11.5–15.5)
WBC: 5.6 10*3/uL (ref 4.0–10.5)

## 2016-09-16 LAB — HEMOGLOBIN A1C: Hgb A1c MFr Bld: 5.7 % (ref 4.6–6.5)

## 2016-09-16 LAB — LIPID PANEL
Cholesterol: 207 mg/dL — ABNORMAL HIGH (ref 0–200)
HDL: 91.9 mg/dL (ref >39.00)
LDL Cholesterol: 102 mg/dL — ABNORMAL HIGH (ref 0–99)
NonHDL: 115.19
Total CHOL/HDL Ratio: 2
Triglycerides: 68 mg/dL (ref 0.0–149.0)
VLDL: 13.6 mg/dL (ref 0.0–40.0)

## 2016-09-16 LAB — VITAMIN B12: Vitamin B-12: >2000 pg/mL — ABNORMAL HIGH (ref 200–1100)

## 2016-09-16 NOTE — Patient Instructions (Signed)
It was great to see you today- your blood pressure looks great.  I will be in touch with your labs asap!

## 2016-09-16 NOTE — Progress Notes (Signed)
Pre visit review using our clinic review tool, if applicable. No additional management support is needed unless otherwise documented below in the visit note. 

## 2016-09-16 NOTE — Progress Notes (Signed)
[facility] [address] [place], [place] [address] [phone] [phone]  Date:  09/16/2016   Name:  Melanie Mylaraula B Knick   DOB:  1966/05/02   MRN:  [id number]  PCP:  Abbe AmsterdamOPLAND,JESSICA, MD    Chief Complaint: Establish Care   History of Present Illness:  Melanie Taylor is a 50 y.o. very pleasant female patient who presents with the following:  transferring care from Dr. Beverely Lowabori to myself.  History of IBS, anxiety.  Last labs about one year ago.   Her IBS is about the same- she has to be careful about what she eats to avoid symptoms. She may take an imodium prior to a social event as needed.   She tried some bentyl but it did not really help.    She has seen GI and had a colonosocpy- this was ok, dx was IBS.   Her GI is Dr. Kinnie ScalesMedoff.  She is not fasting today- she sees Dr. Talmage NapBalan for her thyroid.  She will see her in a few months.   She was hyperthyroid and took PTU for a long time. She stopped taking this several years ago.    She was told that her B12 was low in the past- she is now taking a supplement by mouth for replacement  Lab Results  Component Value Date   TSH 1.47 07/18/2015   She did have a donut for breakfast this am She has one 48 yo daughter.  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 IBS (irritable bowel syndrome) 01/02/2016  . Acute frontal sinusitis 12/15/2015  . Trochanteric bursitis of left hip 11/27/2015  . Atypical chest pain 06/30/2015  . Pain of left calf 12/13/2013  . Eustachian tube dysfunction 12/14/2012  . Physical exam, annual 06/05/2012  . Chigger bites 05/11/2012  . Hyperthyroidism 05/24/2011  . Fatigue 05/24/2011  . Anxiety 05/24/2011  . Decreased libido  05/24/2011  . GERD 04/10/2008  . ABDOMINAL PAIN-EPIGASTRIC 04/10/2008    Past Medical History:  Diagnosis Date  . Allergy   . Arthritis   . GERD (gastroesophageal reflux disease)   . IBS (irritable bowel syndrome)   . Thyroid disease    hyperthyroidism    Past Surgical History:  Procedure Laterality Date  . CESAREAN SECTION  06/2003  . KNEE SURGERY  2003  . SHOULDER SURGERY  2010    Social History  Substance Use Topics  . Smoking status: Never Smoker  . Smokeless tobacco: Never Used  . Alcohol use Yes     Comment: socially    Family History  Problem Relation Age of Onset  . Alcohol abuse Father   . Heart attack Father   . Sudden death Father   . Diabetes Father   . Arthritis Mother   . Hypothyroidism Mother   . Diabetes Mother   . Hypothyroidism Maternal Grandmother   . Cancer Maternal Grandfather     COLON  . Diabetes Other     GRANDFATHER    No Known Allergies  Medication list has been reviewed and updated.  Current Outpatient Prescriptions on File Prior to Visit  Medication Sig Dispense Refill  . ALPRAZolam (  XANAX) 0.5 MG tablet TAKE 1 TABLET BY MOUTH 3 TIMES A DAY AS NEEDED 30 tablet 1  . fluticasone (FLONASE) 50 MCG/ACT nasal spray PLACE 2 SPRAYS INTO BOTH NOSTRILS DAILY. 16 g 3  . omeprazole (PRILOSEC) 20 MG capsule TAKE 1 CAPSULE (20 MG TOTAL) BY MOUTH DAILY. 90 capsule 1  . valACYclovir (VALTREX) 500 MG tablet As directed  0   No current facility-administered medications on file prior to visit.     Review of Systems:  As per HPI- otherwise negative. Notes that she has some hot flashes, and often feels hungry a couple of hours after eating breakfast No fever, chills, nausea, vomiting, diarrhea    Physical Examination: Vitals:   09/16/16 0829  BP: 106/76  Pulse: 75  Resp: 16  Temp: 98.2 F (36.8 C)   Vitals:   09/16/16 0829  Weight: 126 lb 9.6 oz (57.4 kg)  Height: 5\' 1"  (1.549 m)   Body mass index is 23.92 kg/m. Ideal Body  Weight: Weight in (lb) to have BMI = 25: 132  GEN: WDWN, NAD, Non-toxic, A & O x 3, normal weight, looks well HEENT: Atraumatic, Normocephalic. Neck supple. No masses, No LAD.  Bilateral TM wnl, oropharynx normal.  PEERL,EOMI.   Ears and Nose: No external deformity. CV: RRR, No M/G/R. No JVD. No thrill. No extra heart sounds. PULM: CTA B, no wheezes, crackles, rhonchi. No retractions. No resp. distress. No accessory muscle use. ABD: S, NT, ND EXTR: No c/c/e NEURO Normal gait.  PSYCH: Normally interactive. Conversant. Not depressed or anxious appearing.  Calm demeanor.    Assessment and Plan: Irritable bowel syndrome with diarrhea  Screening for deficiency anemia - Plan: CBC  Screening for diabetes mellitus - Plan: Comprehensive metabolic panel, Hemoglobin A1c  Screening for hyperlipidemia - Plan: Lipid panel  Vitamin B12 deficiency - Plan: Vitamin B12  Here today to establish care and discuss her chronic health concerns She has IBS which is stable- she has a GI doctor and will do a colonoscopy soon She has been told that her B12 was low in the past- she is taking a PO supplement We will check her b12 today Will plan further follow- up pending labs.   Signed Abbe AmsterdamJessica Copland, MD

## 2016-09-29 ENCOUNTER — Other Ambulatory Visit: Payer: Self-pay | Admitting: Family Medicine

## 2016-09-29 ENCOUNTER — Encounter: Payer: Self-pay | Admitting: Family Medicine

## 2016-09-29 NOTE — Telephone Encounter (Signed)
Due for a UDS_ will request at next visit.   Reviewed [facility]- Dr. Beverely Lowabori had refilled in August, September and November. No unexpected entries  Called in rx  Meds ordered this encounter  Medications  . ALPRAZolam (XANAX) 0.5 MG tablet    Sig: TAKE 1 TABLET BY MOUTH 3 TIMES A DAY AS NEEDED    Dispense:  30 tablet    Refill:  1    Not to exceed 4 additional fills before 01/02/2017

## 2016-10-07 DIAGNOSIS — J029 Acute pharyngitis, unspecified: Secondary | ICD-10-CM | POA: Diagnosis not present

## 2016-10-12 ENCOUNTER — Encounter: Payer: Self-pay | Admitting: Family Medicine

## 2016-10-13 DIAGNOSIS — M6281 Muscle weakness (generalized): Secondary | ICD-10-CM | POA: Diagnosis not present

## 2016-10-13 DIAGNOSIS — M25551 Pain in right hip: Secondary | ICD-10-CM | POA: Diagnosis not present

## 2016-10-13 DIAGNOSIS — M25552 Pain in left hip: Secondary | ICD-10-CM | POA: Diagnosis not present

## 2016-10-14 DIAGNOSIS — B373 Candidiasis of vulva and vagina: Secondary | ICD-10-CM | POA: Diagnosis not present

## 2016-10-14 DIAGNOSIS — R05 Cough: Secondary | ICD-10-CM | POA: Diagnosis not present

## 2016-10-14 DIAGNOSIS — J209 Acute bronchitis, unspecified: Secondary | ICD-10-CM | POA: Diagnosis not present

## 2016-10-14 DIAGNOSIS — J0140 Acute pansinusitis, unspecified: Secondary | ICD-10-CM | POA: Diagnosis not present

## 2016-10-15 DIAGNOSIS — M25552 Pain in left hip: Secondary | ICD-10-CM | POA: Diagnosis not present

## 2016-10-15 DIAGNOSIS — M25551 Pain in right hip: Secondary | ICD-10-CM | POA: Diagnosis not present

## 2016-10-15 DIAGNOSIS — M6281 Muscle weakness (generalized): Secondary | ICD-10-CM | POA: Diagnosis not present

## 2016-10-19 ENCOUNTER — Other Ambulatory Visit: Payer: Self-pay | Admitting: Family Medicine

## 2016-10-19 DIAGNOSIS — M25551 Pain in right hip: Secondary | ICD-10-CM | POA: Diagnosis not present

## 2016-10-19 DIAGNOSIS — M25552 Pain in left hip: Secondary | ICD-10-CM | POA: Diagnosis not present

## 2016-10-19 DIAGNOSIS — M6281 Muscle weakness (generalized): Secondary | ICD-10-CM | POA: Diagnosis not present

## 2016-10-19 DIAGNOSIS — Z1231 Encounter for screening mammogram for malignant neoplasm of breast: Secondary | ICD-10-CM

## 2016-10-21 DIAGNOSIS — M6281 Muscle weakness (generalized): Secondary | ICD-10-CM | POA: Diagnosis not present

## 2016-10-21 DIAGNOSIS — M25551 Pain in right hip: Secondary | ICD-10-CM | POA: Diagnosis not present

## 2016-10-21 DIAGNOSIS — M25552 Pain in left hip: Secondary | ICD-10-CM | POA: Diagnosis not present

## 2016-10-25 ENCOUNTER — Ambulatory Visit: Payer: Self-pay | Admitting: Family Medicine

## 2016-10-25 ENCOUNTER — Ambulatory Visit (INDEPENDENT_AMBULATORY_CARE_PROVIDER_SITE_OTHER): Payer: BLUE CROSS/BLUE SHIELD | Admitting: Family Medicine

## 2016-10-25 ENCOUNTER — Encounter: Payer: Self-pay | Admitting: Family Medicine

## 2016-10-25 ENCOUNTER — Other Ambulatory Visit (HOSPITAL_COMMUNITY)
Admission: RE | Admit: 2016-10-25 | Discharge: 2016-10-25 | Disposition: A | Discharge status: Home / Self Care | Payer: BLUE CROSS/BLUE SHIELD | Source: Ambulatory Visit | Attending: Family Medicine | Admitting: Family Medicine

## 2016-10-25 VITALS — BP 110/80 | HR 77 | Temp 97.6°F | Ht 61.0 in | Wt 134.2 lb

## 2016-10-25 DIAGNOSIS — N926 Irregular menstruation, unspecified: Secondary | ICD-10-CM | POA: Diagnosis not present

## 2016-10-25 DIAGNOSIS — E0590 Thyrotoxicosis, unspecified without thyrotoxic crisis or storm: Secondary | ICD-10-CM | POA: Diagnosis not present

## 2016-10-25 DIAGNOSIS — N898 Other specified noninflammatory disorders of vagina: Secondary | ICD-10-CM | POA: Diagnosis not present

## 2016-10-25 LAB — POCT URINALYSIS DIP (MANUAL ENTRY)
Bilirubin, UA: NEGATIVE
Blood, UA: NEGATIVE
Glucose, UA: NEGATIVE
Ketones, POC UA: NEGATIVE
Leukocytes, UA: NEGATIVE
Nitrite, UA: NEGATIVE
Protein Ur, POC: NEGATIVE
Spec Grav, UA: 1.02
Urobilinogen, UA: NEGATIVE
pH, UA: 6

## 2016-10-25 MED ORDER — FLUCONAZOLE 150 MG PO TABS: 150.0000 mg | ORAL_TABLET | Freq: Once | ORAL | 0 refills | Status: AC

## 2016-10-25 NOTE — Progress Notes (Signed)
[facility] [address], [address] [place], [place] [phone] [phone]  Date:  10/25/2016   Name:  Melanie Taylor   DOB:  December 04, 1965   MRN:  [id number]  PCP:  Abbe AmsterdamOPLAND,Laberta Wilbon, MD    Chief Complaint: No chief complaint on file.   History of Present Illness:  Melanie Mylaraula B Cookston is a 51 y.o. very pleasant female patient who presents with the following:  Here today to discuss concern of possible persistent yeast infection.  Since around the new year she   She did start on a low dose doxycycline at the end of November for "adult acne," she took it for about 30 days when she noted onset of vaginal itching and discomfort.  She stopped using the doxy after 30 days due to concern of  She tried a 3 day monistat but it seemed to give her more severe irritation and burning, "it lit me up."  She tried to ignore the sx but they persisted.  She called her GYN for a diflucan around 12/29.   She also got an rx for augmentin from UC for her URI at the same time, then took another diflucan dose on 10/19/16.   She has now used 2 doses of diflucan but continues to notice itching, discharge when she will wipe.  It is clear at this time. She did have some pink tinged discharge when the vulva was very irritated and "red and irritated."   She has not menstruated since 07/2016. They think she may be perimenopausal.   She is seeing Dr. Talmage NapBalan today to check her thyroid.   She has noted some hot flashes- stopped OCP in the fall and no menses since.   She has not noted vaginal dryness.  She did have a little irritation the last time she had intercourse.  She is married, no new partners.    Last seen by myself in December - history of IBS which has been stable, she has a GI doctor  She did have a cold over the Christmas holiday but never ran a fever- this is now improved  Patient Active Problem List   Diagnosis Date Noted  . IBS (irritable bowel syndrome)  01/02/2016  . Trochanteric bursitis of left hip 11/27/2015  . Atypical chest pain 06/30/2015  . Pain of left calf 12/13/2013  . Physical exam, annual 06/05/2012  . Hyperthyroidism 05/24/2011  . Fatigue 05/24/2011  . Anxiety 05/24/2011  . Decreased libido 05/24/2011  . GERD 04/10/2008  . ABDOMINAL PAIN-EPIGASTRIC 04/10/2008    Past Medical History:  Diagnosis Date  . Allergy   . Arthritis   . GERD (gastroesophageal reflux disease)   . IBS (irritable bowel syndrome)   . Thyroid disease    hyperthyroidism    Past Surgical History:  Procedure Laterality Date  . CESAREAN SECTION  06/2003  . KNEE SURGERY  2003  . SHOULDER SURGERY  2010    Social History  Substance Use Topics  . Smoking status: Never Smoker  . Smokeless tobacco: Never Used  . Alcohol use Yes     Comment: socially    Family History  Problem Relation Age of Onset  . Alcohol abuse Father   . Heart attack Father   . Sudden death Father   . Diabetes Father   . Arthritis Mother   . Hypothyroidism Mother   . Diabetes Mother   . Hypothyroidism Maternal Grandmother   . Cancer Maternal Grandfather  COLON  . Diabetes Other     GRANDFATHER    No Known Allergies  Medication list has been reviewed and updated.  Current Outpatient Prescriptions on File Prior to Visit  Medication Sig Dispense Refill  . ACZONE 7.5 % GEL APPLY TO THE AFFECTED AREA EVERY DAY  3  . ALPRAZolam (XANAX) 0.5 MG tablet TAKE 1 TABLET BY MOUTH 3 TIMES A DAY AS NEEDED 30 tablet 1  . fluticasone (FLONASE) 50 MCG/ACT nasal spray PLACE 2 SPRAYS INTO BOTH NOSTRILS DAILY. 16 g 3  . omeprazole (PRILOSEC) 20 MG capsule TAKE 1 CAPSULE (20 MG TOTAL) BY MOUTH DAILY. 90 capsule 1  . valACYclovir (VALTREX) 500 MG tablet As directed  0   No current facility-administered medications on file prior to visit.     Review of Systems:  As per HPI- otherwise negative.   Physical Examination:  Blood pressure 110/80, pulse 77, temperature 97.6 F  (36.4 C), temperature source Oral, height 5\' 1"  (1.549 m), weight 134 lb 3.2 oz (60.9 kg), SpO2 99 %. Body mass index is 25.36 kg/m.  GEN: WDWN, NAD, Non-toxic, A & O x 3, looks well, normal weight  HEENT: Atraumatic, Normocephalic. Neck supple. No masses, No LAD. Ears and Nose: No external deformity. CV: RRR, No M/G/R. No JVD. No thrill. No extra heart sounds. PULM: CTA B, no wheezes, crackles, rhonchi. No retractions. No resp. distress. No accessory muscle use. ABD: S, NT, ND. No rebound. No HSM. EXTR: No c/c/e NEURO Normal gait.  PSYCH: Normally interactive. Conversant. Not depressed or anxious appearing.  Calm demeanor.  Pelvic: normal, no vaginal lesions or discharge. Uterus normal, no CMT, no adnexal tendereness or masses   Results for orders placed or performed in visit on 10/25/16  POCT urinalysis dipstick  Result Value Ref Range   Color, UA yellow yellow   Clarity, UA clear clear   Glucose, UA negative negative   Bilirubin, UA negative negative   Ketones, POC UA negative negative   Spec Grav, UA 1.020    Blood, UA negative negative   pH, UA 6.0    Protein Ur, POC negative negative   Urobilinogen, UA negative    Nitrite, UA Negative Negative   Leukocytes, UA Negative Negative     Assessment and Plan: Vaginal irritation - Plan: POCT urinalysis dipstick, Cervicovaginal ancillary only, fluconazole (DIFLUCAN) 150 MG tablet  Here today with vaginal irritation likely caused by abx use.  She is now improved but has concern that she does not feel completely back to normal.  She is not concerned about any infidelity or STI and declines testing for same today  It was very nice to see you today!  I agree that your vaginal irritation was likely set off by antibiotic use and then got worse with the topical monistat- some women are really sensitive to this!  Your vulva looks healthy today but I will do a swab to check for any persistent yeast.  If you would like to take a diflucan  pill that is fine, you can repeat in one week if any symptoms remain If you do find that you begin to have vaginal dryness or irritation with intercourse a topical estrogen may be helpful- let me or your GYN know in this case.  Regular intercourse can be helpful as far as preventing vaginal dryness and irritation If you do start to notice any strange or irregular vaginal bleeding do let me or your GYN know.  Take care and I will be  in touch with your results asap   Signed Abbe Amsterdam, MD

## 2016-10-25 NOTE — Patient Instructions (Signed)
It was very nice to see you today!  I agree that your vaginal irritation was likely set off by antibiotic use and then got worse with the topical monistat- some women are really sensitive to this!  Your vulva looks healthy today but I will do a swab to check for any persistent yeast.  If you would like to take a diflucan pill that is fine, you can repeat in one week if any symptoms remain If you do find that you begin to have vaginal dryness or irritation with intercourse a topical estrogen may be helpful- let me or your GYN know in this case.  Regular intercourse can be helpful as far as preventing vaginal dryness and irritation If you do start to notice any strange or irregular vaginal bleeding do let me or your GYN know.  Take care and I will be in touch with your results asap

## 2016-10-26 DIAGNOSIS — M25552 Pain in left hip: Secondary | ICD-10-CM | POA: Diagnosis not present

## 2016-10-26 DIAGNOSIS — M6281 Muscle weakness (generalized): Secondary | ICD-10-CM | POA: Diagnosis not present

## 2016-10-26 DIAGNOSIS — M25551 Pain in right hip: Secondary | ICD-10-CM | POA: Diagnosis not present

## 2016-10-26 LAB — CERVICOVAGINAL ANCILLARY ONLY: Wet Prep (BD Affirm): NEGATIVE

## 2016-11-04 DIAGNOSIS — M25551 Pain in right hip: Secondary | ICD-10-CM | POA: Diagnosis not present

## 2016-11-04 DIAGNOSIS — M6281 Muscle weakness (generalized): Secondary | ICD-10-CM | POA: Diagnosis not present

## 2016-11-04 DIAGNOSIS — M25552 Pain in left hip: Secondary | ICD-10-CM | POA: Diagnosis not present

## 2016-11-09 DIAGNOSIS — M6281 Muscle weakness (generalized): Secondary | ICD-10-CM | POA: Diagnosis not present

## 2016-11-09 DIAGNOSIS — M25552 Pain in left hip: Secondary | ICD-10-CM | POA: Diagnosis not present

## 2016-11-09 DIAGNOSIS — M25551 Pain in right hip: Secondary | ICD-10-CM | POA: Diagnosis not present

## 2016-11-10 ENCOUNTER — Ambulatory Visit
Admission: RE | Admit: 2016-11-10 | Discharge: 2016-11-10 | Disposition: A | Discharge status: Home / Self Care | Payer: BLUE CROSS/BLUE SHIELD | Source: Ambulatory Visit | Attending: Family Medicine | Admitting: Family Medicine

## 2016-11-10 DIAGNOSIS — Z1231 Encounter for screening mammogram for malignant neoplasm of breast: Secondary | ICD-10-CM

## 2016-11-16 DIAGNOSIS — M25551 Pain in right hip: Secondary | ICD-10-CM | POA: Diagnosis not present

## 2016-11-16 DIAGNOSIS — M25552 Pain in left hip: Secondary | ICD-10-CM | POA: Diagnosis not present

## 2016-11-16 DIAGNOSIS — M6281 Muscle weakness (generalized): Secondary | ICD-10-CM | POA: Diagnosis not present

## 2016-11-19 DIAGNOSIS — M25551 Pain in right hip: Secondary | ICD-10-CM | POA: Diagnosis not present

## 2016-11-19 DIAGNOSIS — M6281 Muscle weakness (generalized): Secondary | ICD-10-CM | POA: Diagnosis not present

## 2016-11-19 DIAGNOSIS — M25552 Pain in left hip: Secondary | ICD-10-CM | POA: Diagnosis not present

## 2016-11-22 ENCOUNTER — Encounter: Payer: BLUE CROSS/BLUE SHIELD | Admitting: Family Medicine

## 2016-11-24 DIAGNOSIS — M25552 Pain in left hip: Secondary | ICD-10-CM | POA: Diagnosis not present

## 2016-11-24 DIAGNOSIS — M25551 Pain in right hip: Secondary | ICD-10-CM | POA: Diagnosis not present

## 2016-11-24 DIAGNOSIS — M6281 Muscle weakness (generalized): Secondary | ICD-10-CM | POA: Diagnosis not present

## 2016-12-17 ENCOUNTER — Other Ambulatory Visit: Payer: Self-pay | Admitting: Family Medicine

## 2016-12-20 ENCOUNTER — Other Ambulatory Visit: Payer: Self-pay | Admitting: Emergency Medicine

## 2016-12-20 MED ORDER — ALPRAZOLAM 0.5 MG PO TABS: 0.5000 mg | ORAL_TABLET | Freq: Three times a day (TID) | ORAL | 2 refills | Status: DC | PRN

## 2016-12-20 NOTE — Telephone Encounter (Signed)
Reviewed NCCSR-  Last filled a 30 day supply (30 pills) on 11/11/16, then 09/29/16- nothing unexpected. [facility] to [facility]  Meds ordered this encounter  Medications  . ALPRAZolam (XANAX) 0.5 MG tablet    Sig: Take 1 tablet (0.5 mg total) by mouth 3 (three) times daily as needed.    Dispense:  30 tablet    Refill:  2    Not to exceed 4 additional fills before 01/02/2017

## 2016-12-20 NOTE — Telephone Encounter (Signed)
Received refill request for ALPRAZolam (XANAX) 0.5 MG tablet. Last office visit 10/25/16 and last refill 09/29/16. Is it ok to refill? Please advise.

## 2016-12-22 DIAGNOSIS — M7062 Trochanteric bursitis, left hip: Secondary | ICD-10-CM | POA: Diagnosis not present

## 2016-12-22 DIAGNOSIS — E0590 Thyrotoxicosis, unspecified without thyrotoxic crisis or storm: Secondary | ICD-10-CM | POA: Diagnosis not present

## 2017-01-03 ENCOUNTER — Other Ambulatory Visit: Payer: Self-pay | Admitting: Family Medicine

## 2017-01-04 NOTE — Progress Notes (Signed)
[facility] [address] [place], [place] [address] [phone]  Date:  01/05/2017   Name:  Melanie Taylor   DOB:  08-Jul-1966   MRN:  [id number]  PCP:  Abbe Amsterdam, MD    Chief Complaint: Annual Exam   History of Present Illness:  Melanie Taylor is a 51 y.o. very pleasant female patient who presents with the following: Here today for a CPE-  Last seen by myself in December- we did blood work for her then.  Her last pap was 06/2015 per OBG- it was negative but transformation zone absent.    Her menses came yesterday- they are now irregular and she has been told that she is perimenopausal by her endocrinologist Talmage Nap).  We will do her pap another day as she is bleeding pretty well She did have an abnl pap when she was maybe 51 years old.   She sees Dr. Kinnie Scales for her GI concerns Last mammo: earlier this year  Her father was dx with pancreatic cancer recently and she also takes care of her elderly mother.  They are living on their own but need a fair amount of help  She has noted some pain in her neck and shoulders; she has been to see her chiropractor and also got a massage yesterday. Her mother gave he a muscle relaxer and it did help- she is not sure of the name of the muscle relaxer that she used  She does use valtrex prn for fever blisters- she needs a refill of this Lab Results  Component Value Date   TSH 1.47 07/18/2015   Otherwise her most recent labs were in December- CMP, lipids, CBC, A1c She is on xanax as needed for anxiety- she did do a controlled substance contact back in 2014 UDS is due today  She is taking her xanax at bedtime- and she may take a 1/2 during the day on occasionif needed She did not take a xanax last night, but she did take a muscle relaxer and slept fine  NCCSR today: xanax only- filled 30 on 3/12 and 2/1, 12/20  Patient Active Problem List   Diagnosis Date Noted  . IBS  (irritable bowel syndrome) 01/02/2016  . Atypical chest pain 06/30/2015  . Hyperthyroidism 05/24/2011  . Fatigue 05/24/2011  . Anxiety 05/24/2011  . Decreased libido 05/24/2011  . GERD 04/10/2008  . ABDOMINAL PAIN-EPIGASTRIC 04/10/2008    Past Medical History:  Diagnosis Date  . Allergy   . Arthritis   . GERD (gastroesophageal reflux disease)   . IBS (irritable bowel syndrome)   . Thyroid disease    hyperthyroidism    Past Surgical History:  Procedure Laterality Date  . CESAREAN SECTION  06/2003  . KNEE SURGERY  2003  . SHOULDER SURGERY  2010    Social History  Substance Use Topics  . Smoking status: Never Smoker  . Smokeless tobacco: Never Used  . Alcohol use Yes     Comment: socially    Family History  Problem Relation Age of Onset  . Alcohol abuse Father   . Heart attack Father   . Sudden death Father   . Diabetes Father   . Arthritis Mother   . Hypothyroidism Mother   . Diabetes Mother   . Hypothyroidism Maternal Grandmother   . Cancer Maternal Grandfather     COLON  . Diabetes Other     GRANDFATHER    No Known Allergies  Medication list has been reviewed and updated.  Current Outpatient Prescriptions on File Prior to Visit  Medication Sig Dispense Refill  . ACZONE 7.5 % GEL APPLY TO THE AFFECTED AREA EVERY DAY  3  . ALPRAZolam (XANAX) 0.5 MG tablet Take 1 tablet (0.5 mg total) by mouth 3 (three) times daily as needed. 30 tablet 2  . fluticasone (FLONASE) 50 MCG/ACT nasal spray PLACE 2 SPRAYS INTO BOTH NOSTRILS DAILY. 16 g 3  . omeprazole (PRILOSEC) 20 MG capsule TAKE 1 CAPSULE (20 MG TOTAL) BY MOUTH DAILY. 90 capsule 1  . valACYclovir (VALTREX) 500 MG tablet As directed  0   No current facility-administered medications on file prior to visit.     Review of Systems:  As per HPI- otherwise negative.   Physical Examination: Vitals:   01/05/17 0829  BP: 129/81  Pulse: 70  Temp: 98.3 F (36.8 C)   Vitals:   01/05/17 0829  Weight: 126 lb  (57.2 kg)  Height: 5\' 1"  (1.549 m)   Body mass index is 23.81 kg/m. Ideal Body Weight:    GEN: WDWN, NAD, Non-toxic, A & O x 3, normal weight, looks well HEENT: Atraumatic, Normocephalic. Neck supple. No masses, No LAD.  Bilateral TM wnl, oropharynx normal.  PEERL,EOMI.   Ears and Nose: No external deformity. CV: RRR, No M/G/R. No JVD. No thrill. No extra heart sounds. PULM: CTA B, no wheezes, crackles, rhonchi. No retractions. No resp. distress. No accessory muscle use. ABD: S, NT, ND, +BS. No rebound. No HSM. EXTR: No c/c/e NEURO Normal gait.  PSYCH: Normally interactive. Conversant. Not depressed or anxious appearing.  Calm demeanor.  Breast: normal exam, no masses/ dimpling/ discharge She is sore and quite tight/ in spasm through the bilateral trapezius muscles   Assessment and Plan: Physical exam  Strain of trapezius muscle, unspecified laterality, initial encounter - Plan: methocarbamol (ROBAXIN) 500 MG tablet  Muscle pain - Plan: methocarbamol (ROBAXIN) 500 MG tablet  Cold sore - Plan: valACYclovir (VALTREX) 500 MG tablet  Stress due to illness of family member  Here today for a CPE Her labs are UTD and her thyroid is managed by endocrinology Refilled her prn valtrex We plan to do her pap later on this year rx for robaxin to use as needed for muscle pain.  Also encouraged heat, and massage as needed She will let me know if not helpful for her UDS today See patient instructions for more details.     Signed Abbe AmsterdamJessica Shooter Tangen, MD

## 2017-01-05 ENCOUNTER — Ambulatory Visit (INDEPENDENT_AMBULATORY_CARE_PROVIDER_SITE_OTHER): Payer: BLUE CROSS/BLUE SHIELD | Admitting: Family Medicine

## 2017-01-05 ENCOUNTER — Encounter: Payer: Self-pay | Admitting: Family Medicine

## 2017-01-05 VITALS — BP 129/81 | HR 70 | Temp 98.3°F | Ht 61.0 in | Wt 126.0 lb

## 2017-01-05 DIAGNOSIS — S46819A Strain of other muscles, fascia and tendons at shoulder and upper arm level, unspecified arm, initial encounter: Secondary | ICD-10-CM

## 2017-01-05 DIAGNOSIS — M791 Myalgia, unspecified site: Secondary | ICD-10-CM

## 2017-01-05 DIAGNOSIS — Z0000 Encounter for general adult medical examination without abnormal findings: Secondary | ICD-10-CM | POA: Diagnosis not present

## 2017-01-05 DIAGNOSIS — B001 Herpesviral vesicular dermatitis: Secondary | ICD-10-CM

## 2017-01-05 DIAGNOSIS — Z6379 Other stressful life events affecting family and household: Secondary | ICD-10-CM | POA: Diagnosis not present

## 2017-01-05 MED ORDER — VALACYCLOVIR HCL 500 MG PO TABS: ORAL_TABLET | ORAL | 3 refills | Status: DC

## 2017-01-05 MED ORDER — METHOCARBAMOL 500 MG PO TABS: 500.0000 mg | ORAL_TABLET | Freq: Three times a day (TID) | ORAL | 1 refills | Status: DC | PRN

## 2017-01-05 NOTE — Patient Instructions (Addendum)
It was very nice to see you today- I am sorry for all the stressors in your family right now  We can do your pap for you at your convenience Please do your urine drug screen today at the lab- we do this annually for patients on a controlled medication  Continue to use your xanax as needed- use caution as this mediction can be habit forming I gave you a muscle relaxer- robaxin- to use as needed for muscle pain. This can make you feel drowsy; avoid taking at the same time as your xanax

## 2017-01-05 NOTE — Progress Notes (Signed)
Pre visit review using our clinic review tool, if applicable. No additional management support is needed unless otherwise documented below in the visit note. 

## 2017-01-06 ENCOUNTER — Encounter: Payer: Self-pay | Admitting: Family Medicine

## 2017-01-06 DIAGNOSIS — Z79899 Other long term (current) drug therapy: Secondary | ICD-10-CM | POA: Diagnosis not present

## 2017-01-26 DIAGNOSIS — M7062 Trochanteric bursitis, left hip: Secondary | ICD-10-CM | POA: Diagnosis not present

## 2017-02-22 ENCOUNTER — Other Ambulatory Visit: Payer: Self-pay | Admitting: Family Medicine

## 2017-03-11 DIAGNOSIS — M7062 Trochanteric bursitis, left hip: Secondary | ICD-10-CM | POA: Diagnosis not present

## 2017-03-19 DIAGNOSIS — M7062 Trochanteric bursitis, left hip: Secondary | ICD-10-CM | POA: Diagnosis not present

## 2017-03-23 DIAGNOSIS — M7062 Trochanteric bursitis, left hip: Secondary | ICD-10-CM | POA: Diagnosis not present

## 2017-05-18 ENCOUNTER — Other Ambulatory Visit: Payer: Self-pay | Admitting: Family Medicine

## 2017-05-18 NOTE — Telephone Encounter (Signed)
NCCSR: she filled alprazolam 30 on 5/20. 4/18, 3/12 Will refill for her today

## 2017-05-18 NOTE — Telephone Encounter (Signed)
Pt request refill on ALPRAZolam (XANAX) 0.5 MG tablet. Last office visit 01/05/17. Last refill: 12/20/16. Please advise.

## 2017-07-18 ENCOUNTER — Encounter: Payer: Self-pay | Admitting: Family Medicine

## 2017-07-21 DIAGNOSIS — M1811 Unilateral primary osteoarthritis of first carpometacarpal joint, right hand: Secondary | ICD-10-CM | POA: Diagnosis not present

## 2017-07-21 DIAGNOSIS — M25571 Pain in right ankle and joints of right foot: Secondary | ICD-10-CM | POA: Diagnosis not present

## 2017-07-21 DIAGNOSIS — M1812 Unilateral primary osteoarthritis of first carpometacarpal joint, left hand: Secondary | ICD-10-CM | POA: Diagnosis not present

## 2017-07-24 NOTE — Progress Notes (Addendum)
[facility] [address], [address] [place], [place] [address] [phone]  Date:  07/25/2017   Name:  Melanie Taylor   DOB:  September 06, 1966   MRN:  [id number]  PCP:  Pearline Cables, MD    Chief Complaint: Insomnia (c/o trouble sleeping and hot flashes. )   History of Present Illness:  Melanie Taylor is a 51 y.o. very pleasant female patient who presents with the following:  Here today to discuss hot flashes. She had communicated with me via email and was most interested in trying an SSRI or SNRI for these sx - would suggest citalopram 20 mg for her  Dr. Talmage Nap is her endocrinologist and manages her thyroid- however she is not sure if she needs to continue seeing her, as they are just periodically checking her TSH at this time.  She wonders if I could do this for her to save her an MD visit.  This is fine by me  She has noted hot flashes "all day and night" for the last 2-3 months, but they were present to a more mild degree prior to this time period Her LMP was in June, and her menses were spacing out a bit prior to this as well She came off her OCP within the last 12 months; her menstrual bleeding did become heavier once she came off the OCP.    She also notes that her feet and hands seem to be going to sleep at night. She has noted this for the last month. Will be right and left both, does not occur during the day at all  She has not noted any serious mood changes despite the loss of her dad.  However she has felt perhaps a bit anxious  She is not really interested in using hormones to control her hot flashes due to potential risks.    She has not been sleeping as well due to the hot flashes- she has xanax that she can use as needed.  She does not use this every night but admits she is using it more than she did in the past. She feels awful if she does not sleep   Otherwise she continues to be in her usual state of good health    Patient Active Problem List   Diagnosis Date Noted  . IBS (irritable bowel syndrome) 01/02/2016  . Atypical chest pain 06/30/2015  . Hyperthyroidism 05/24/2011  . Fatigue 05/24/2011  . Anxiety 05/24/2011  . Decreased libido 05/24/2011  . GERD 04/10/2008  . ABDOMINAL PAIN-EPIGASTRIC 04/10/2008    Past Medical History:  Diagnosis Date  . Allergy   . Arthritis   . GERD (gastroesophageal reflux disease)   . IBS (irritable bowel syndrome)   . Thyroid disease    hyperthyroidism    Past Surgical History:  Procedure Laterality Date  . CESAREAN SECTION  06/2003  . KNEE SURGERY  2003  . SHOULDER SURGERY  2010    Social History  Substance Use Topics  . Smoking status: Never Smoker  . Smokeless tobacco: Never Used  . Alcohol use Yes     Comment: socially    Family History  Problem Relation Age of Onset  . Alcohol abuse Father   . Heart attack Father   . Sudden death Father   . Diabetes Father   . Arthritis Mother   . Hypothyroidism Mother   . Diabetes Mother   . Hypothyroidism Maternal Grandmother   .  Cancer Maternal Grandfather        COLON  . Diabetes Other        GRANDFATHER    No Known Allergies  Medication list has been reviewed and updated.  Current Outpatient Prescriptions on File Prior to Visit  Medication Sig Dispense Refill  . ACZONE 7.5 % GEL APPLY TO THE AFFECTED AREA EVERY DAY  3  . ALPRAZolam (XANAX) 0.5 MG tablet TAKE 1 TABLET BY MOUTH 3 TIMES A DAY AS NEEDED FOR ANXIETY 30 tablet 2  . fluticasone (FLONASE) 50 MCG/ACT nasal spray PLACE 2 SPRAYS INTO BOTH NOSTRILS DAILY. 16 g 3  . levothyroxine (SYNTHROID, LEVOTHROID) 25 MCG tablet TAKE 1 TABLET ONCE A DAY ORALLY 90 DAYS  1  . methocarbamol (ROBAXIN) 500 MG tablet Take 1 tablet (500 mg total) by mouth every 8 (eight) hours as needed for muscle spasms. 30 tablet 1  . omeprazole (PRILOSEC) 20 MG capsule TAKE 1 CAPSULE (20 MG TOTAL) BY MOUTH DAILY. 90 capsule 1  . valACYclovir (VALTREX) 500 MG tablet  Take 4 pills (2gm) once, repeat in 12 hours as needed for cold sore 30 tablet 3   No current facility-administered medications on file prior to visit.     Review of Systems:  As per HPI- otherwise negative. No fever or chills No CP or SOB No nausea, vomiting or diarrhea    Physical Examination: Vitals:   07/25/17 1116  BP: 110/72  Pulse: 82  Temp: 97.7 F (36.5 C)  SpO2: 98%   Vitals:   07/25/17 1116  Weight: 117 lb (53.1 kg)  Height:  (1.549 m)   Body mass index is 22.11 kg/m. Ideal Body Weight: Weight in (lb) to have BMI = 25: 132  GEN: WDWN, NAD, Non-toxic, A & O x 3, looks well, slim build HEENT: Atraumatic, Normocephalic. Neck supple. No masses, No LAD. Ears and Nose: No external deformity. CV: RRR, No M/G/R. No JVD. No thrill. No extra heart sounds. PULM: CTA B, no wheezes, crackles, rhonchi. No retractions. No resp. distress. No accessory muscle use. ABD: S, NT, ND, +BS. No rebound. No HSM. EXTR: No c/c/e NEURO Normal gait.  PSYCH: Normally interactive. Conversant. Not depressed or anxious appearing.  Calm demeanor.    Assessment and Plan: Hot flashes - Plan: TSH, citalopram (CELEXA) 20 MG tablet  Immunization due - Plan: Flu Vaccine QUAD 36+ mos IM (Fluarix & Fluzone Quad PF  Numbness of fingers of both hands - Plan: Comprehensive metabolic panel, CBC, B12, Folate, Ferritin  Here today with concern of hot flashes, and also numbness of her hands AND feet that occurs only at night Will try celexa for her hot flashes- counseled that this will likely need 2-4 weeks to take effect  Labs pending as above to look for cause of hand/ foot numbness.  If all normal will offer neurology appt to her  Signed Abbe Amsterdam, MD  Received her labs- message to pt , 10/16 Would like to back down on her thyroid replacement as her TSH is suppressed- however since she is already on just 25 mcg may have to stop entirely    Results for orders placed or performed in  visit on 07/25/17  TSH  Result Value Ref Range   TSH 0.04 (L) 0.35 - 4.50 uIU/mL  Comprehensive metabolic panel  Result Value Ref Range   Sodium 142 135 - 145 mEq/L   Potassium 4.9 3.5 - 5.1 mEq/L   Chloride 103 96 - 112 mEq/L  CO2 32 19 - 32 mEq/L   Glucose, Bld 94 70 - 99 mg/dL   BUN 15 6 - 23 mg/dL   Creatinine, Ser 1.61 0.40 - 1.20 mg/dL   Total Bilirubin 0.5 0.2 - 1.2 mg/dL   Alkaline Phosphatase 80 39 - 117 U/L   AST 18 0 - 37 U/L   ALT 12 0 - 35 U/L   Total Protein 6.9 6.0 - 8.3 g/dL   Albumin 4.3 3.5 - 5.2 g/dL   Calcium 9.7 8.4 - 09.6 mg/dL   GFR 04.54 >09.81 mL/min  CBC  Result Value Ref Range   WBC 5.8 4.0 - 10.5 K/uL   RBC 4.54 3.87 - 5.11 Mil/uL   Platelets 320.0 150.0 - 400.0 K/uL   Hemoglobin 13.7 12.0 - 15.0 g/dL   HCT 19.1 47.8 - 29.5 %   MCV 91.3 78.0 - 100.0 fl   MCHC 33.0 30.0 - 36.0 g/dL   RDW 62.1 30.8 - 65.7 %  B12  Result Value Ref Range   Vitamin B-12 >1500 (H) 211 - 911 pg/mL  Folate  Result Value Ref Range   Folate 19.5 >5.9 ng/mL  Ferritin  Result Value Ref Range   Ferritin 18.5 10.0 - 291.0 ng/mL

## 2017-07-25 ENCOUNTER — Ambulatory Visit (INDEPENDENT_AMBULATORY_CARE_PROVIDER_SITE_OTHER): Payer: BLUE CROSS/BLUE SHIELD | Admitting: Family Medicine

## 2017-07-25 VITALS — BP 110/72 | HR 82 | Temp 97.7°F | Ht 61.0 in | Wt 117.0 lb

## 2017-07-25 DIAGNOSIS — R232 Flushing: Secondary | ICD-10-CM | POA: Diagnosis not present

## 2017-07-25 DIAGNOSIS — R200 Anesthesia of skin: Secondary | ICD-10-CM | POA: Diagnosis not present

## 2017-07-25 DIAGNOSIS — Z23 Encounter for immunization: Secondary | ICD-10-CM | POA: Diagnosis not present

## 2017-07-25 LAB — COMPREHENSIVE METABOLIC PANEL
ALT: 12 U/L (ref 0–35)
AST: 18 U/L (ref 0–37)
Albumin: 4.3 g/dL (ref 3.5–5.2)
Alkaline Phosphatase: 80 U/L (ref 39–117)
BUN: 15 mg/dL (ref 6–23)
CO2: 32 mEq/L (ref 19–32)
Calcium: 9.7 mg/dL (ref 8.4–10.5)
Chloride: 103 mEq/L (ref 96–112)
Creatinine, Ser: 0.81 mg/dL (ref 0.40–1.20)
GFR: 79.91 mL/min (ref >60.00)
Glucose, Bld: 94 mg/dL (ref 70–99)
Potassium: 4.9 mEq/L (ref 3.5–5.1)
Sodium: 142 mEq/L (ref 135–145)
Total Bilirubin: 0.5 mg/dL (ref 0.2–1.2)
Total Protein: 6.9 g/dL (ref 6.0–8.3)

## 2017-07-25 LAB — CBC
HCT: 41.5 % (ref 36.0–46.0)
Hemoglobin: 13.7 g/dL (ref 12.0–15.0)
MCHC: 33 g/dL (ref 30.0–36.0)
MCV: 91.3 fl (ref 78.0–100.0)
Platelets: 320 10*3/uL (ref 150.0–400.0)
RBC: 4.54 Mil/uL (ref 3.87–5.11)
RDW: 13.4 % (ref 11.5–15.5)
WBC: 5.8 10*3/uL (ref 4.0–10.5)

## 2017-07-25 LAB — TSH: TSH: 0.04 u[IU]/mL — ABNORMAL LOW (ref 0.35–4.50)

## 2017-07-25 LAB — VITAMIN B12: Vitamin B-12: >1500 pg/mL — ABNORMAL HIGH (ref 211–911)

## 2017-07-25 LAB — FERRITIN: Ferritin: 18.5 ng/mL (ref 10.0–291.0)

## 2017-07-25 LAB — FOLATE: Folate: 19.5 ng/mL (ref >5.9)

## 2017-07-25 MED ORDER — CITALOPRAM HYDROBROMIDE 20 MG PO TABS
20.0000 mg | ORAL_TABLET | Freq: Every day | ORAL | 9 refills | Status: DC
Start: 2017-07-25 — End: 2018-01-05

## 2017-07-25 NOTE — Patient Instructions (Signed)
I hope that the citalopram will help with your hot flashes - we will be in touch with your labs as well

## 2017-07-26 ENCOUNTER — Encounter: Payer: Self-pay | Admitting: Family Medicine

## 2017-08-01 ENCOUNTER — Encounter: Payer: Self-pay | Admitting: Family Medicine

## 2017-08-17 ENCOUNTER — Other Ambulatory Visit: Payer: Self-pay | Admitting: Family Medicine

## 2017-08-26 DIAGNOSIS — E0590 Thyrotoxicosis, unspecified without thyrotoxic crisis or storm: Secondary | ICD-10-CM | POA: Diagnosis not present

## 2017-08-31 DIAGNOSIS — E0590 Thyrotoxicosis, unspecified without thyrotoxic crisis or storm: Secondary | ICD-10-CM | POA: Diagnosis not present

## 2017-09-03 DIAGNOSIS — B349 Viral infection, unspecified: Secondary | ICD-10-CM | POA: Diagnosis not present

## 2017-09-13 ENCOUNTER — Encounter: Payer: Self-pay | Admitting: Family Medicine

## 2017-09-13 MED ORDER — ALPRAZOLAM 0.5 MG PO TABS: ORAL_TABLET | ORAL | 2 refills | Status: DC

## 2017-09-13 NOTE — Telephone Encounter (Signed)
She filled alprazolam on 10/14- ok to RF

## 2017-11-01 IMAGING — CR DG HIP (WITH OR WITHOUT PELVIS) 2-3V*L*
3 series · 3 of 3 positions shown · non-contrast
Comparison: None.

CLINICAL DATA: Left hip pain for 6 weeks. Trochanteric bursitis on
the left.

EXAM:
DG HIP (WITH OR WITHOUT PELVIS) 2-3V LEFT

[pelvis ap]
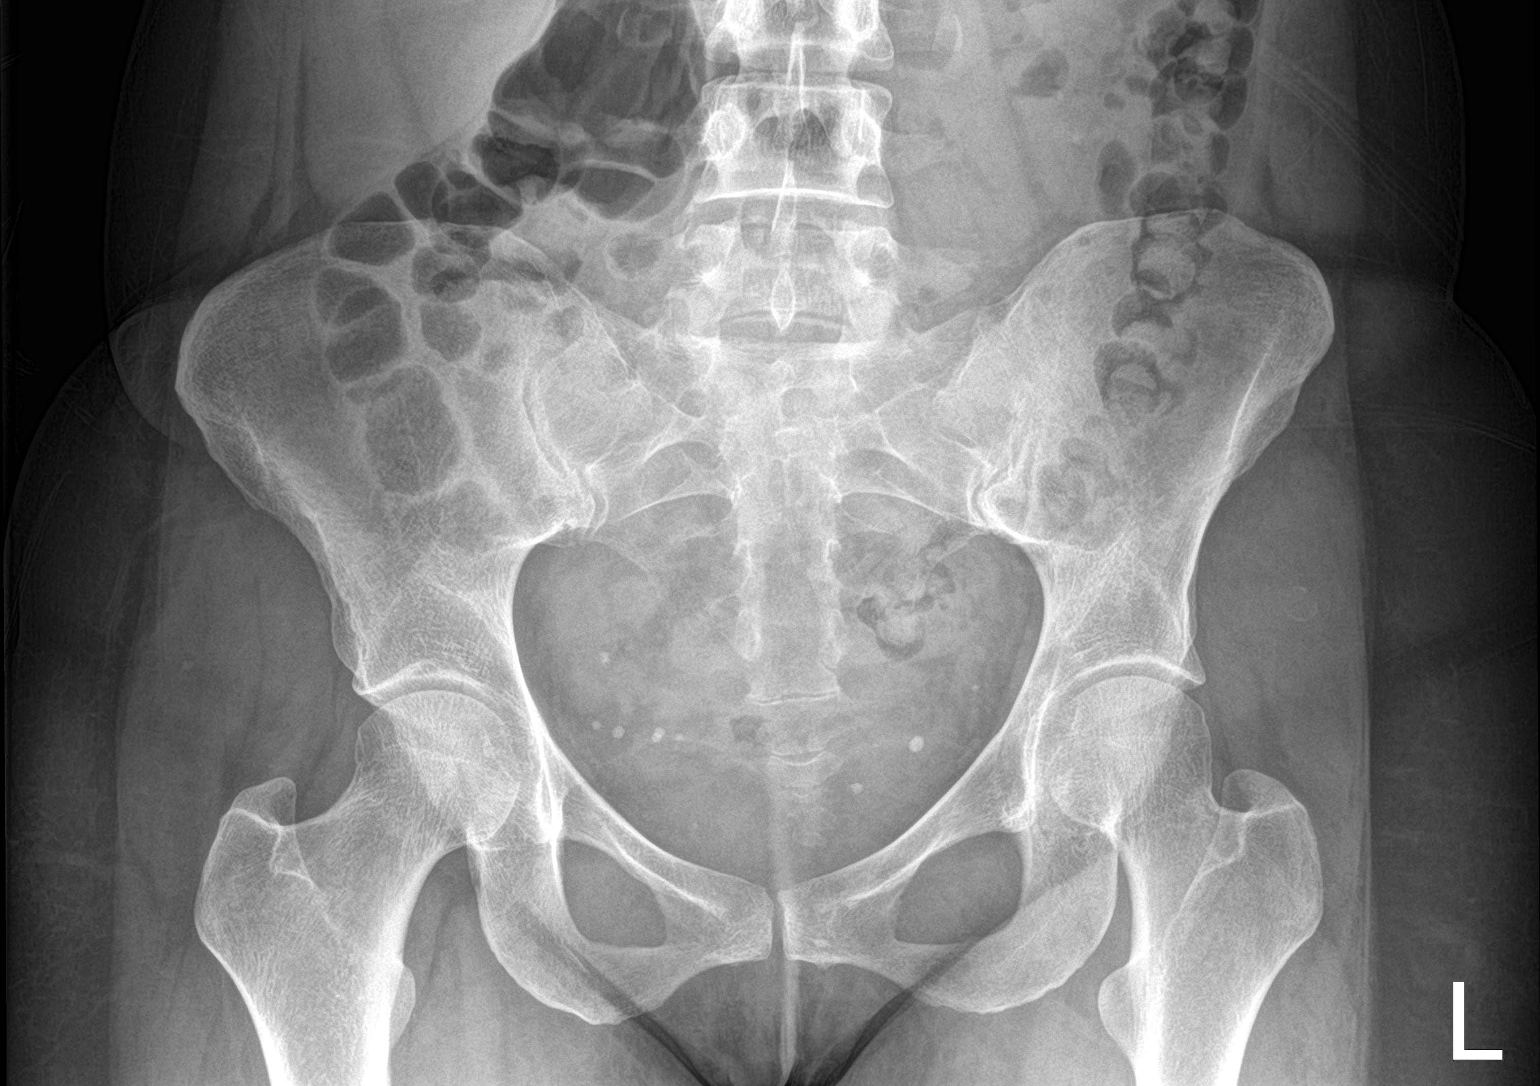

[hip ap]
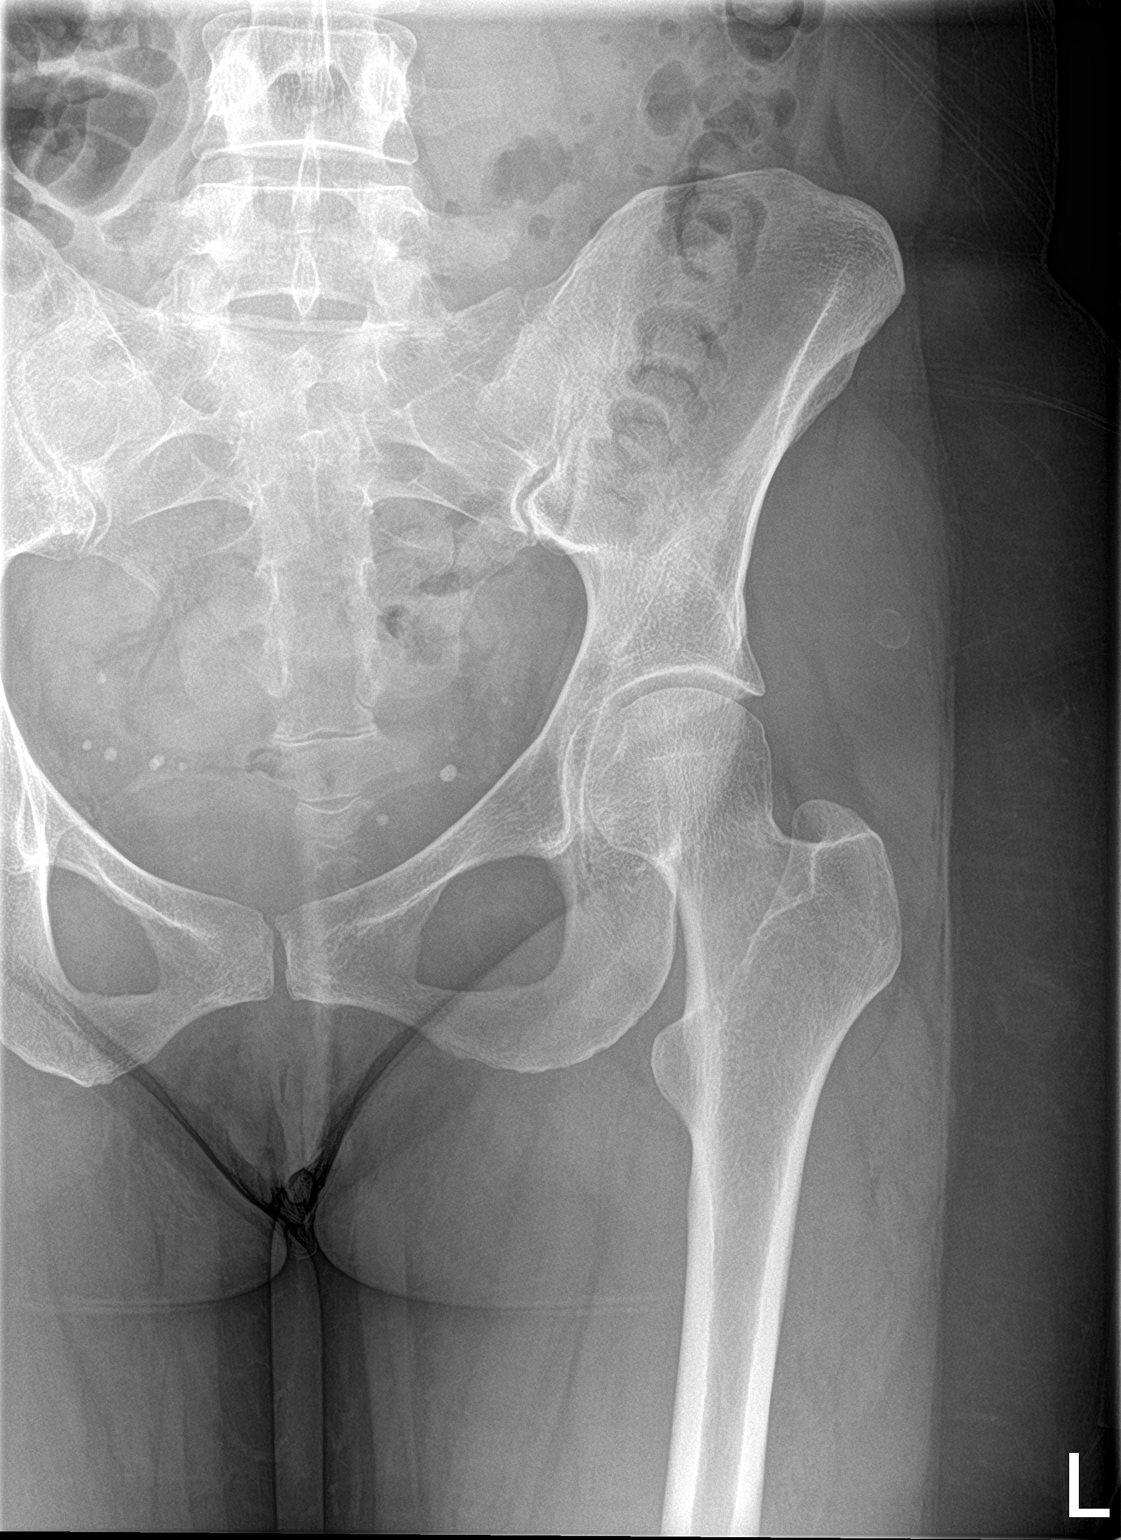

[hip lat]
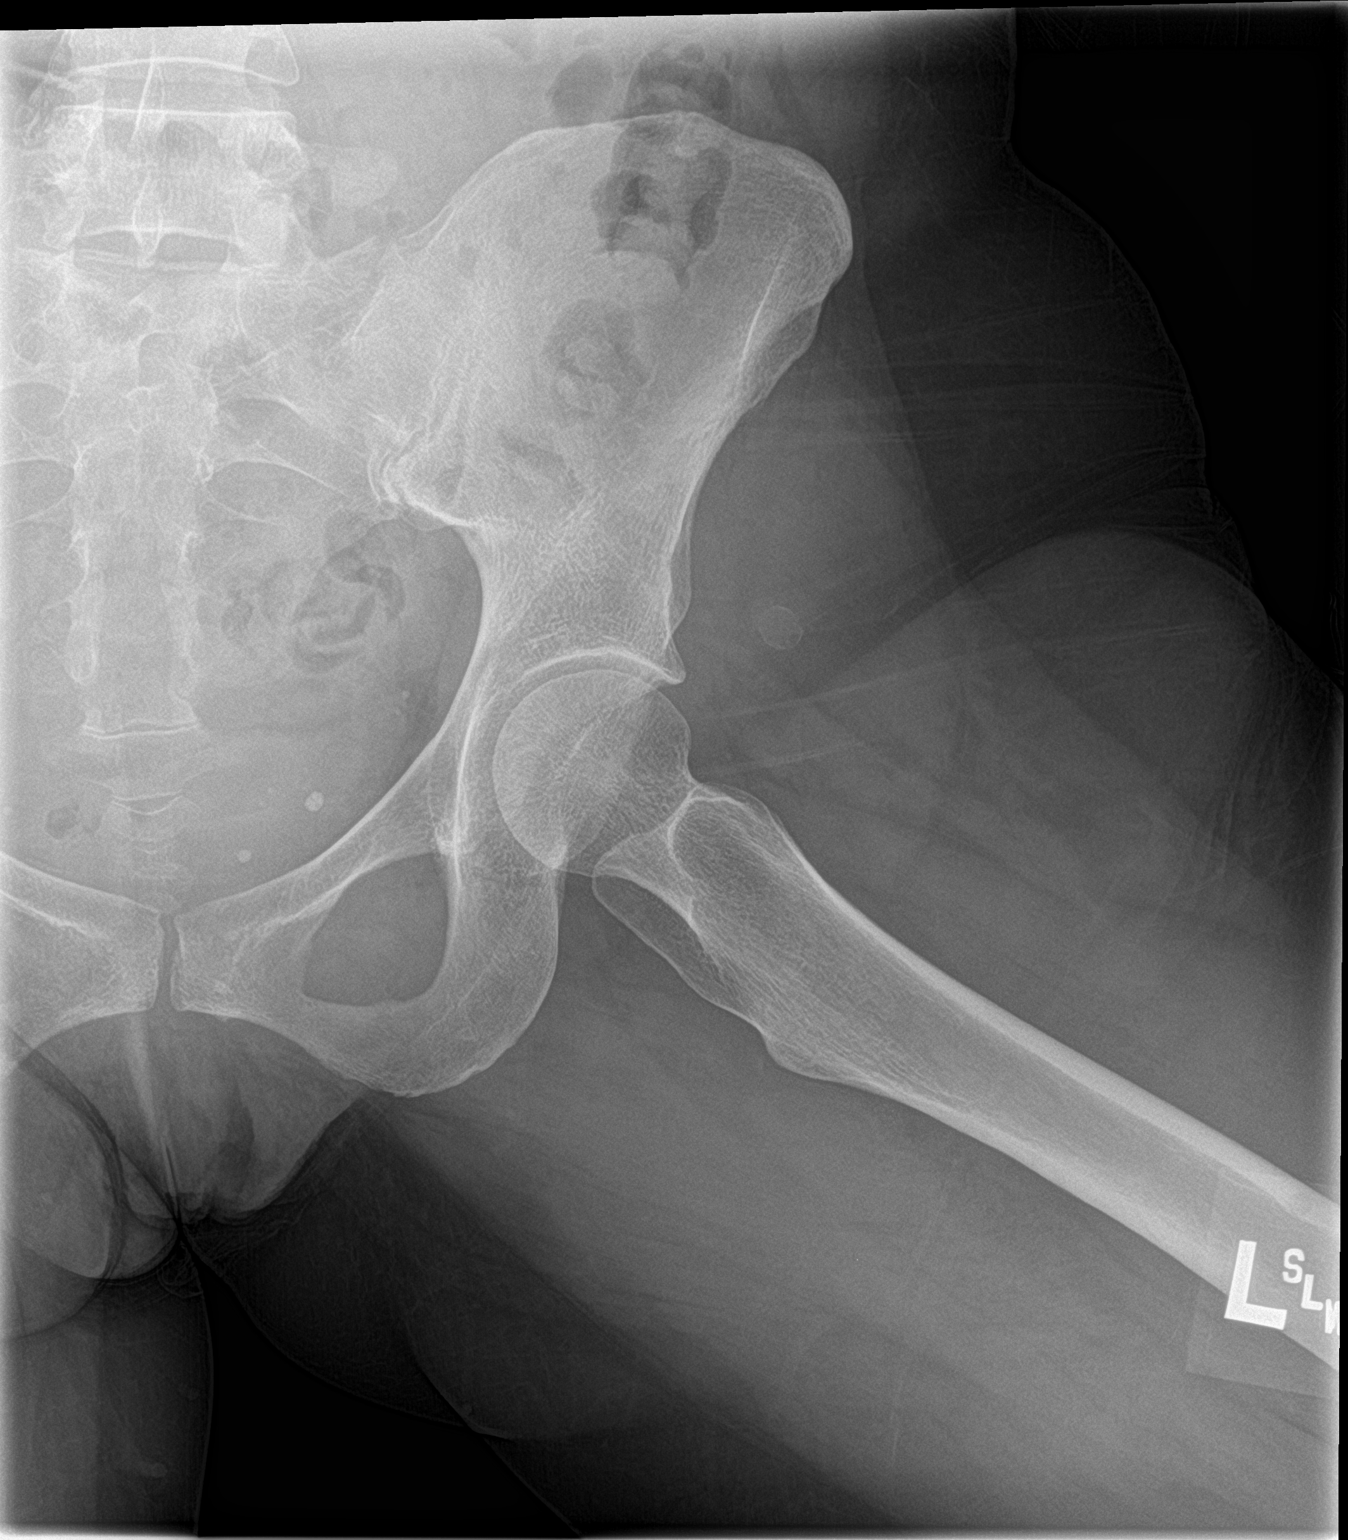

[3 of 3 positions shown; findings below may reference images not displayed]

FINDINGS: There is no evidence of hip fracture or dislocation. No erosion or
soft tissue ossification. No evidence of focal bone lesion. Eggshell
calcification over the left gluteal region compatible with fat
necrosis.
IMPRESSION: Negative left hip.

## 2017-11-13 ENCOUNTER — Other Ambulatory Visit: Payer: Self-pay | Admitting: Family Medicine

## 2017-11-14 DIAGNOSIS — K219 Gastro-esophageal reflux disease without esophagitis: Secondary | ICD-10-CM | POA: Diagnosis not present

## 2017-11-14 DIAGNOSIS — R490 Dysphonia: Secondary | ICD-10-CM | POA: Diagnosis not present

## 2017-11-15 ENCOUNTER — Other Ambulatory Visit: Payer: Self-pay | Admitting: Otolaryngology

## 2017-11-15 DIAGNOSIS — R490 Dysphonia: Secondary | ICD-10-CM

## 2017-11-22 DIAGNOSIS — G5603 Carpal tunnel syndrome, bilateral upper limbs: Secondary | ICD-10-CM | POA: Diagnosis not present

## 2017-11-24 ENCOUNTER — Ambulatory Visit
Admission: RE | Admit: 2017-11-24 | Discharge: 2017-11-24 | Disposition: A | Discharge status: Home / Self Care | Payer: BLUE CROSS/BLUE SHIELD | Source: Ambulatory Visit | Attending: Otolaryngology | Admitting: Otolaryngology

## 2017-11-24 DIAGNOSIS — R490 Dysphonia: Secondary | ICD-10-CM | POA: Diagnosis not present

## 2017-12-01 DIAGNOSIS — E0590 Thyrotoxicosis, unspecified without thyrotoxic crisis or storm: Secondary | ICD-10-CM | POA: Diagnosis not present

## 2017-12-10 ENCOUNTER — Telehealth: Payer: Self-pay | Admitting: Family Medicine

## 2017-12-12 NOTE — Telephone Encounter (Signed)
Pt is requesting refill on alprazolam 0.5mg.  

## 2017-12-13 DIAGNOSIS — G5603 Carpal tunnel syndrome, bilateral upper limbs: Secondary | ICD-10-CM | POA: Diagnosis not present

## 2017-12-13 DIAGNOSIS — M79641 Pain in right hand: Secondary | ICD-10-CM | POA: Diagnosis not present

## 2017-12-13 DIAGNOSIS — R200 Anesthesia of skin: Secondary | ICD-10-CM | POA: Diagnosis not present

## 2017-12-13 DIAGNOSIS — M79642 Pain in left hand: Secondary | ICD-10-CM | POA: Diagnosis not present

## 2017-12-13 DIAGNOSIS — R202 Paresthesia of skin: Secondary | ICD-10-CM | POA: Diagnosis not present

## 2017-12-13 NOTE — Progress Notes (Signed)
[facility] at [facility] [address], [address] [place], [place] [address] [phone]  Date:  12/14/2017   Name:  Melanie Taylor   DOB:  03-Jan-1966   MRN:  [id number]  PCP:  Pearline Cables, MD    Chief Complaint: Urinary Frequency (c/o increased urination for the past few weeks. Also still having numbness and tingling in hands and feet at night. )   History of Present Illness:  Melanie Taylor is a 52 y.o. very pleasant female patient who presents with the following:  Concern of urinary symtoms today- she has noted  History of hypothyroidism-  She had an [place] per ENT in February  IMPRESSION: Mildly heterogeneous and hypoechoic thyroid gland with slight hypervascularity. Query mild thyroiditis. No discrete nodule or focal abnormality.  Last seen by myself in October  At our last visit she did mention an issue with her hands and feet: She also notes that her feet and hands seem to be going to sleep at night. She has noted this for the last month. Will be right and left both, does not occur during the day at all   She still noted that her hands are numb frequently, and her feet are also sometimes numb She had nerve conduction studies per the hand center yesterday- apparently no CTS noted She had normal vit D 2016 Normal B12 and folate 10/18  She had a borderline A1c last year and is a bit concerned about DM She has noted that her urine smelled strange over the last few weeks No dysuria however she is having to go more frequently/ urgently. She may wake up at 0300 with a need to urinate.  She then will sometimes feel like she needs to go again  She going through menopause- last MP in July, then had a little spotting the last few days. This seems to have resolved now. Counseled that any bleeding after a year of menopause needs to be reported   Lab Results  Component Value Date   HGBA1C 5.7 09/16/2016    Patient Active Problem List   Diagnosis Date Noted  . IBS (irritable bowel syndrome) 01/02/2016  . Atypical chest pain 06/30/2015  . Hyperthyroidism 05/24/2011  . Fatigue 05/24/2011  . Anxiety 05/24/2011  . Decreased libido 05/24/2011  . GERD 04/10/2008  . ABDOMINAL PAIN-EPIGASTRIC 04/10/2008    Past Medical History:  Diagnosis Date  . Allergy   . Arthritis   . GERD (gastroesophageal reflux disease)   . IBS (irritable bowel syndrome)   . Thyroid disease    hyperthyroidism    Past Surgical History:  Procedure Laterality Date  . CESAREAN SECTION  06/2003  . KNEE SURGERY  2003  . SHOULDER SURGERY  2010    Social History   Tobacco Use  . Smoking status: Never Smoker  . Smokeless tobacco: Never Used  Substance Use Topics  . Alcohol use: Yes    Comment: socially  . Drug use: No    Family History  Problem Relation Age of Onset  . Alcohol abuse Father   . Heart attack Father   . Sudden death Father   . Diabetes Father   . Arthritis Mother   . Hypothyroidism Mother   . Diabetes Mother   . Hypothyroidism Maternal Grandmother   . Cancer Maternal Grandfather        COLON  . Diabetes Other        GRANDFATHER    No  Known Allergies  Medication list has been reviewed and updated.  Current Outpatient Medications on File Prior to Visit  Medication Sig Dispense Refill  . ACZONE 7.5 % GEL APPLY TO THE AFFECTED AREA AS NEEDED  3  . ALPRAZolam (XANAX) 0.5 MG tablet TAKE 1 TABLET BY MOUTH 3 TIMES A DAY AS NEEDED FOR ANXIETY 30 tablet 2  . citalopram (CELEXA) 20 MG tablet Take 1 tablet (20 mg total) by mouth daily. 30 tablet 9  . fluticasone (FLONASE) 50 MCG/ACT nasal spray PLACE 2 SPRAYS INTO BOTH NOSTRILS DAILY. 16 g 3  . levothyroxine (SYNTHROID, LEVOTHROID) 25 MCG tablet TAKE 1 TABLET ONCE A DAY ORALLY 90 DAYS  1  . methocarbamol (ROBAXIN) 500 MG tablet Take 1 tablet (500 mg total) by mouth every 8 (eight) hours as needed for muscle spasms. 30 tablet 1  . omeprazole (PRILOSEC) 20 MG capsule TAKE 1  CAPSULE (20 MG TOTAL) BY MOUTH DAILY. 90 capsule 1  . valACYclovir (VALTREX) 500 MG tablet Take 4 pills (2gm) once, repeat in 12 hours as needed for cold sore 30 tablet 3   No current facility-administered medications on file prior to visit.     Review of Systems:  As per HPI- otherwise negative. No fever or chills She is using her xanax every night for sleep as she is worked up about her health issues/ and her hand sx, and she will not sleep without it.   No vaginal symptoms  Physical Examination: Vitals:   12/14/17 0854  BP: 120/82  Pulse: 73  Temp: 98 F (36.7 C)  SpO2: 99%   Vitals:   12/14/17 0854  Weight: 119 lb 9.6 oz (54.3 kg)  Height: 5\' 2"  (1.575 m)   Body mass index is 21.88 kg/m. Ideal Body Weight: Weight in (lb) to have BMI = 25: 136.4  GEN: WDWN, NAD, Non-toxic, A & O x 3, looks well, normal weight HEENT: Atraumatic, Normocephalic. Neck supple. No masses, No LAD. Bilateral TM wnl, oropharynx normal.  PEERL,EOMI.   Ears and Nose: No external deformity. CV: RRR, No M/G/R. No JVD. No thrill. No extra heart sounds. PULM: CTA B, no wheezes, crackles, rhonchi. No retractions. No resp. distress. No accessory muscle use. ABD: S, NT, ND, +BS. No rebound. No HSM. EXTR: No c/c/e NEURO Normal gait.  PSYCH: Normally interactive. Conversant. Not depressed or anxious appearing.  Calm demeanor.    Assessment and Plan: Urinary frequency - Plan: POCT urinalysis dipstick, Urine Culture, CANCELED: Basic metabolic panel, CANCELED: Hemoglobin A1c  Pre-diabetes - Plan: Comprehensive metabolic panel, Hemoglobin A1c, CANCELED: Hemoglobin A1c  Numbness of fingers of both hands - Plan: CBC  Screening for hyperlipidemia - Plan: Lipid panel  Following up on a few concerns today UA is benign, but we will check a urine culture today She is seeing hand for her hand numbness. Will consider having her see neurology if she likes, but for now she declines She is concerned about  history of borderline A1c, wants to repeat this today which is fine   Signed Abbe AmsterdamJessica Lilianna Case, MD   Received her labs Results for orders placed or performed in visit on 12/14/17  Comprehensive metabolic panel  Result Value Ref Range   Sodium 139 135 - 145 mEq/L   Potassium 5.0 3.5 - 5.1 mEq/L   Chloride 102 96 - 112 mEq/L   CO2 32 19 - 32 mEq/L   Glucose, Bld 81 70 - 99 mg/dL   BUN 15 6 - 23 mg/dL  Creatinine, Ser 1.10 0.40 - 1.20 mg/dL   Total Bilirubin 0.6 0.2 - 1.2 mg/dL   Alkaline Phosphatase 73 39 - 117 U/L   AST 36 0 - 37 U/L   ALT 37 (H) 0 - 35 U/L   Total Protein 7.7 6.0 - 8.3 g/dL   Albumin 4.7 3.5 - 5.2 g/dL   Calcium 75.6 8.4 - 43.3 mg/dL   GFR 29.51 (L) >88.41 mL/min  CBC  Result Value Ref Range   WBC 4.1 4.0 - 10.5 K/uL   RBC 4.68 3.87 - 5.11 Mil/uL   Platelets 272.0 150.0 - 400.0 K/uL   Hemoglobin 14.3 12.0 - 15.0 g/dL   HCT 66.0 63.0 - 16.0 %   MCV 92.0 78.0 - 100.0 fl   MCHC 33.3 30.0 - 36.0 g/dL   RDW 10.9 (H) 32.3 - 55.7 %  Lipid panel  Result Value Ref Range   Cholesterol 254 (H) 0 - 200 mg/dL   Triglycerides 32.2 0.0 - 149.0 mg/dL   HDL 02.54 >27.06 mg/dL   VLDL 23.7 0.0 - 62.8 mg/dL   LDL Cholesterol 315 (H) 0 - 99 mg/dL   Total CHOL/HDL Ratio 3    NonHDL 162.42   Hemoglobin A1c  Result Value Ref Range   Hgb A1c MFr Bld 5.8 4.6 - 6.5 %  POCT urinalysis dipstick  Result Value Ref Range   Color, UA yellow yellow   Clarity, UA clear clear   Glucose, UA negative negative mg/dL   Bilirubin, UA negative negative   Ketones, POC UA negative negative mg/dL   Spec Grav, UA 1.761 6.073 - 1.025   Blood, UA negative negative   pH, UA 6.0 5.0 - 8.0   Protein Ur, POC negative negative mg/dL   Urobilinogen, UA 0.2 0.2 or 1.0 E.U./dL   Nitrite, UA Negative Negative   Leukocytes, UA Negative Negative   Await urine culture, but will send message with labs so far  Just waiting on your urine culture, will be back in touch Otherwise your labs are overall  good-  A1c is still in the low pre-diabetes range.  I do not expect this to cause any urinary symptoms  Metabolic profile is ok Blood count is normal Your cholesterol ratio is ok since your HDL (good cholesterol) is quite high. However, your LDL is high.  We could consider starting a low dose of a cholesterol medication for you- what are your thoughts?

## 2017-12-14 ENCOUNTER — Ambulatory Visit (INDEPENDENT_AMBULATORY_CARE_PROVIDER_SITE_OTHER): Payer: BLUE CROSS/BLUE SHIELD | Admitting: Family Medicine

## 2017-12-14 ENCOUNTER — Encounter: Payer: Self-pay | Admitting: Family Medicine

## 2017-12-14 VITALS — BP 120/82 | HR 73 | Temp 98.0°F | Ht 62.0 in | Wt 119.6 lb

## 2017-12-14 DIAGNOSIS — R200 Anesthesia of skin: Secondary | ICD-10-CM

## 2017-12-14 DIAGNOSIS — Z13220 Encounter for screening for lipoid disorders: Secondary | ICD-10-CM

## 2017-12-14 DIAGNOSIS — R350 Frequency of micturition: Secondary | ICD-10-CM | POA: Diagnosis not present

## 2017-12-14 DIAGNOSIS — R7303 Prediabetes: Secondary | ICD-10-CM

## 2017-12-14 LAB — COMPREHENSIVE METABOLIC PANEL
ALT: 37 U/L — ABNORMAL HIGH (ref 0–35)
AST: 36 U/L (ref 0–37)
Albumin: 4.7 g/dL (ref 3.5–5.2)
Alkaline Phosphatase: 73 U/L (ref 39–117)
BUN: 15 mg/dL (ref 6–23)
CO2: 32 mEq/L (ref 19–32)
Calcium: 10 mg/dL (ref 8.4–10.5)
Chloride: 102 mEq/L (ref 96–112)
Creatinine, Ser: 1.1 mg/dL (ref 0.40–1.20)
GFR: 56.04 mL/min — ABNORMAL LOW (ref >60.00)
Glucose, Bld: 81 mg/dL (ref 70–99)
Potassium: 5 mEq/L (ref 3.5–5.1)
Sodium: 139 mEq/L (ref 135–145)
Total Bilirubin: 0.6 mg/dL (ref 0.2–1.2)
Total Protein: 7.7 g/dL (ref 6.0–8.3)

## 2017-12-14 LAB — LIPID PANEL
Cholesterol: 254 mg/dL — ABNORMAL HIGH (ref 0–200)
HDL: 91.4 mg/dL (ref >39.00)
LDL Cholesterol: 146 mg/dL — ABNORMAL HIGH (ref 0–99)
NonHDL: 162.42
Total CHOL/HDL Ratio: 3
Triglycerides: 80 mg/dL (ref 0.0–149.0)
VLDL: 16 mg/dL (ref 0.0–40.0)

## 2017-12-14 LAB — CBC
HCT: 43 % (ref 36.0–46.0)
Hemoglobin: 14.3 g/dL (ref 12.0–15.0)
MCHC: 33.3 g/dL (ref 30.0–36.0)
MCV: 92 fl (ref 78.0–100.0)
Platelets: 272 10*3/uL (ref 150.0–400.0)
RBC: 4.68 Mil/uL (ref 3.87–5.11)
RDW: 16.6 % — ABNORMAL HIGH (ref 11.5–15.5)
WBC: 4.1 10*3/uL (ref 4.0–10.5)

## 2017-12-14 LAB — HEMOGLOBIN A1C: Hgb A1c MFr Bld: 5.8 % (ref 4.6–6.5)

## 2017-12-14 LAB — POCT URINALYSIS DIP (MANUAL ENTRY)
Bilirubin, UA: NEGATIVE
Blood, UA: NEGATIVE
Glucose, UA: NEGATIVE mg/dL
Ketones, POC UA: NEGATIVE mg/dL
Leukocytes, UA: NEGATIVE
Nitrite, UA: NEGATIVE
Protein Ur, POC: NEGATIVE mg/dL
Spec Grav, UA: 1.02 (ref 1.010–1.025)
Urobilinogen, UA: 0.2 E.U./dL
pH, UA: 6 (ref 5.0–8.0)

## 2017-12-14 NOTE — Patient Instructions (Addendum)
I will be in touch with your urine culture and other labs asap- I doubt that you have developed diabetes, but we will check on this for you If any bacteria on your urine culture I will treat you for a UTI  You are considered to be through menopause when you have no bleeding for one year- if bleeding occurs after this mark please be sure to report it!

## 2017-12-15 LAB — URINE CULTURE
MICRO NUMBER:: 90287855
SPECIMEN QUALITY:: ADEQUATE

## 2017-12-23 ENCOUNTER — Encounter: Payer: Self-pay | Admitting: Family Medicine

## 2017-12-23 MED ORDER — PRAVASTATIN SODIUM 20 MG PO TABS: 20.0000 mg | ORAL_TABLET | Freq: Every day | ORAL | 3 refills | Status: DC

## 2017-12-29 ENCOUNTER — Encounter: Payer: Self-pay | Admitting: Family Medicine

## 2018-01-04 ENCOUNTER — Other Ambulatory Visit: Payer: Self-pay | Admitting: Family Medicine

## 2018-01-05 ENCOUNTER — Encounter: Payer: Self-pay | Admitting: Medical

## 2018-01-05 ENCOUNTER — Ambulatory Visit (INDEPENDENT_AMBULATORY_CARE_PROVIDER_SITE_OTHER): Payer: BLUE CROSS/BLUE SHIELD | Admitting: Medical

## 2018-01-05 VITALS — BP 112/70 | HR 89 | Temp 97.9°F | Resp 16 | Ht 62.0 in | Wt 117.8 lb

## 2018-01-05 DIAGNOSIS — J0100 Acute maxillary sinusitis, unspecified: Secondary | ICD-10-CM | POA: Diagnosis not present

## 2018-01-05 DIAGNOSIS — R0981 Nasal congestion: Secondary | ICD-10-CM

## 2018-01-05 DIAGNOSIS — J40 Bronchitis, not specified as acute or chronic: Secondary | ICD-10-CM

## 2018-01-05 MED ORDER — FLUTICASONE PROPIONATE 50 MCG/ACT NA SUSP: 2.0000 | Freq: Every day | NASAL | 1 refills | Status: DC

## 2018-01-05 MED ORDER — LEVOCETIRIZINE DIHYDROCHLORIDE 5 MG PO TABS: 5.0000 mg | ORAL_TABLET | Freq: Every evening | ORAL | 0 refills | Status: DC

## 2018-01-05 MED ORDER — AZITHROMYCIN 250 MG PO TABS: ORAL_TABLET | ORAL | 0 refills | Status: DC

## 2018-01-05 MED ORDER — HYDROCODONE-HOMATROPINE 5-1.5 MG/5ML PO SYRP: 5.0000 mL | ORAL_SOLUTION | Freq: Three times a day (TID) | ORAL | 0 refills | Status: DC | PRN

## 2018-01-05 NOTE — Patient Instructions (Addendum)
You appear to have bronchitis and sinusitis. Rest hydrate and tylenol for fever. I am prescribing cough medicine hycodan, and azithromycin antibiotic. For your nasal congestion I prescribed flonase.  Recommend starting xyzal in about one week. Likely allergy component and upcoming allergy season.  You should gradually get better. If not then notify us and would recommend a chest xray.  Follow up in 7-10 days or as needed

## 2018-01-05 NOTE — Progress Notes (Signed)
Subjective:    Patient ID: Melanie Taylor, female    DOB: Dec 07, 1965, 52 y.o.   MRN: [id number]  HPI   Pt in with some recent sinus pressure. She states last Thursday got st for 3 days or so then got more nasal congestion, hoarse voice, pnd, cough then sinus pressure by Sunday night.   Pt states cough is little productive and faint nasal congestion.  Pt has been using some zyrtec D. It did seem relieve her congestion.  Some history of allergic rhinitis in spring.   Review of Systems  Constitutional: Negative for chills, fatigue and fever.  HENT: Positive for congestion, sinus pressure, sinus pain and sore throat. Negative for sneezing.   Respiratory: Positive for cough. Negative for shortness of breath and wheezing.        Recently at night was coughing so much..  Cardiovascular: Negative for chest pain and palpitations.  Gastrointestinal: Negative for abdominal pain, diarrhea, nausea and rectal pain.  Musculoskeletal: Negative for back pain and myalgias.  Skin: Negative for rash.  Neurological: Negative for dizziness and headaches.  Hematological: Negative for adenopathy. Does not bruise/bleed easily.  Psychiatric/Behavioral: Negative for behavioral problems and confusion.    Past Medical History:  Diagnosis Date  . Allergy   . Arthritis   . GERD (gastroesophageal reflux disease)   . IBS (irritable bowel syndrome)   . Thyroid disease    hyperthyroidism     Social History   Socioeconomic History  . Marital status: Married    Spouse name: Not on file  . Number of children: Not on file  . Years of education: Not on file  . Highest education level: Not on file  Occupational History  . Not on file  Social Needs  . Financial resource strain: Not on file  . Food insecurity:    Worry: Not on file    Inability: Not on file  . Transportation needs:    Medical: Not on file    Non-medical: Not on file  Tobacco Use  . Smoking status: Never Smoker  . Smokeless  tobacco: Never Used  Substance and Sexual Activity  . Alcohol use: Yes    Comment: socially  . Drug use: No  . Sexual activity: Not on file  Lifestyle  . Physical activity:    Days per week: Not on file    Minutes per session: Not on file  . Stress: Not on file  Relationships  . Social connections:    Talks on phone: Not on file    Gets together: Not on file    Attends religious service: Not on file    Active member of club or organization: Not on file    Attends meetings of clubs or organizations: Not on file    Relationship status: Not on file  . Intimate partner violence:    Fear of current or ex partner: Not on file    Emotionally abused: Not on file    Physically abused: Not on file    Forced sexual activity: Not on file  Other Topics Concern  . Not on file  Social History Narrative   [id number]    Past Surgical History:  Procedure Laterality Date  . CESAREAN SECTION  06/2003  . KNEE SURGERY  2003  . SHOULDER SURGERY  2010    Family History  Problem Relation Age of Onset  . Alcohol abuse Father   . Heart attack Father   . Sudden death Father   . Diabetes  Father   . Arthritis Mother   . Hypothyroidism Mother   . Diabetes Mother   . Hypothyroidism Maternal Grandmother   . Cancer Maternal Grandfather        COLON  . Diabetes Other        GRANDFATHER    No Known Allergies  Current Outpatient Medications on File Prior to Visit  Medication Sig Dispense Refill  . ACZONE 7.5 % GEL APPLY TO THE AFFECTED AREA AS NEEDED  3  . ALPRAZolam (XANAX) 0.5 MG tablet TAKE 1 TABLET BY MOUTH 3 TIMES A DAY AS NEEDED FOR ANXIETY 30 tablet 2  . fluticasone (FLONASE) 50 MCG/ACT nasal spray PLACE 2 SPRAYS INTO BOTH NOSTRILS DAILY. 16 g 3  . levothyroxine (SYNTHROID, LEVOTHROID) 25 MCG tablet TAKE 1 TABLET ONCE A DAY ORALLY 90 DAYS  1  . methocarbamol (ROBAXIN) 500 MG tablet Take 1 tablet (500 mg total) by mouth every 8 (eight) hours as needed for muscle spasms. 30 tablet 1  .  omeprazole (PRILOSEC) 20 MG capsule TAKE 1 CAPSULE (20 MG TOTAL) BY MOUTH DAILY. 90 capsule 1  . valACYclovir (VALTREX) 500 MG tablet Take 4 pills (2gm) once, repeat in 12 hours as needed for cold sore 30 tablet 3  . pravastatin (PRAVACHOL) 20 MG tablet Take 1 tablet (20 mg total) by mouth daily. (Patient not taking: Reported on 01/05/2018) 90 tablet 3   No current facility-administered medications on file prior to visit.     BP 112/70 (BP Location: Right Arm, Patient Position: Sitting, Cuff Size: Normal)   Pulse 89   Temp 97.9 F (36.6 C) (Oral)   Resp 16   Ht 5\' 2"  (1.575 m)   Wt 117 lb 12.8 oz (53.4 kg)   SpO2 99%   BMI 21.55 kg/m       Objective:   Physical Exam  General  Mental Status - Alert. General Appearance - Well groomed. Not in acute distress.  Skin Rashes- No Rashes.  HEENT Head- Normal. Ear Auditory Canal - Left- Normal. Right - Normal.Tympanic Membrane- Left- Normal. Right- Normal. Eye Sclera/Conjunctiva- Left- Normal. Right- Normal. Nose & Sinuses Nasal Mucosa- Left-  Boggy and Congested. Right-  Boggy and  Congested.Bilateral maxillary and frontal sinus pressure. Mouth & Throat Lips: Upper Lip- Normal: no dryness, cracking, pallor, cyanosis, or vesicular eruption. Lower Lip-Normal: no dryness, cracking, pallor, cyanosis or vesicular eruption. Buccal Mucosa- Bilateral- No Aphthous ulcers. Oropharynx- No Discharge or Erythema. Tonsils: Characteristics- Bilateral- No Erythema or Congestion. Size/Enlargement- Bilateral- No enlargement. Discharge- bilateral-None.  Neck Neck- Supple. No Masses.   Chest and Lung Exam Auscultation: Breath Sounds:-Clear even and unlabored.  Cardiovascular Auscultation:Rythm- Regular, rate and rhythm. Murmurs & Other Heart Sounds:Ausculatation of the heart reveal- No Murmurs.  Lymphatic Head & Neck General Head & Neck Lymphatics: Bilateral: Description- No Localized lymphadenopathy.       Assessment & Plan:   You  appear to have bronchitis and sinusitis. Rest hydrate and tylenol for fever. I am prescribing cough medicine hycodan, and azithromycin antibiotic. For your nasal congestion I prescribed flonase.  Recommend starting xyzal in about one week. Likely allergy component and upcoming allergy season.  You should gradually get better. If not then notify us and would recommend a chest xray.  Follow up in 7-10 days or as needed  Whole FoodsEdward Sander Speckman, VF CorporationPA-C

## 2018-01-09 NOTE — Progress Notes (Signed)
[facility] [address], [address] [place], [place] [address] [phone] [phone]  Date:  01/11/2018   Name:  Melanie Taylor   DOB:  September 14, 1966   MRN:  [id number]  PCP:  Pearline Cablesopland, Kinaya Hilliker C, MD    Chief Complaint: No chief complaint on file.   History of Present Illness:  Melanie Taylor is a 52 y.o. very pleasant female patient who presents with the following:  Here today for a CPE history of IBS, hyperthyroidism, anxiety, GERD History of thyroidisis and she does see endocrinology-  Talmage NapBalan is her endocrinologist.  They are working on getting this under control for her. She is just on 25 mcg of levothyroxine right now  Mammo: 2016? She will get this done asap  Pap: will do today.  Done in 2016, never had an abnormal that she can recall Colon: she did do a colon at age 52 as her grandfather had colon cancer Tdap: UTD Flu: done Labs:  Done last month when she was in for possible UTI-  Per my labs notes: Just waiting on your urine culture, will be back in touch  Otherwise your labs are overall good-  A1c is still in the low pre-diabetes range. I do not expect this to cause any urinary symptoms  Metabolic profile is ok  Blood count is normal  Your cholesterol ratio is ok since your HDL (good cholesterol) is quite high. However, your LDL is high. We could consider starting a low dose of a cholesterol medication for you- what are your thoughts?   She did elect to try a cholesterol med- rx for pravachol. However seemed to cause fatigue, we were not sure if due to medication or not. Her lipids have always been good, she would like to repeat in 6 months and then think about trying pravachol again   Her daughter is 50 yo and doing well- they are planning a family trip over Easter break [facility] No CP or SOB with exercise She plans to do a colonoscopy when she turns 52 yo  The numbness she had in her hands has gotten  better again since she is on thyroid meds.  She did have nerve conduction studies which did not show CTS  Patient Active Problem List   Diagnosis Date Noted  . IBS (irritable bowel syndrome) 01/02/2016  . Atypical chest pain 06/30/2015  . Hyperthyroidism 05/24/2011  . Fatigue 05/24/2011  . Anxiety 05/24/2011  . Decreased libido 05/24/2011  . GERD 04/10/2008  . ABDOMINAL PAIN-EPIGASTRIC 04/10/2008    Past Medical History:  Diagnosis Date  . Allergy   . Arthritis   . GERD (gastroesophageal reflux disease)   . IBS (irritable bowel syndrome)   . Thyroid disease    hyperthyroidism    Past Surgical History:  Procedure Laterality Date  . CESAREAN SECTION  06/2003  . KNEE SURGERY  2003  . SHOULDER SURGERY  2010    Social History   Tobacco Use  . Smoking status: Never Smoker  . Smokeless tobacco: Never Used  Substance Use Topics  . Alcohol use: Yes    Comment: socially  . Drug use: No    Family History  Problem Relation Age of Onset  . Alcohol abuse Father   . Heart attack Father   . Sudden death Father   . Diabetes Father   . Arthritis Mother   . Hypothyroidism Mother   . Diabetes Mother   .  Hypothyroidism Maternal Grandmother   . Cancer Maternal Grandfather        COLON  . Diabetes Other        GRANDFATHER    No Known Allergies  Medication list has been reviewed and updated.  Current Outpatient Medications on File Prior to Visit  Medication Sig Dispense Refill  . ACZONE 7.5 % GEL APPLY TO THE AFFECTED AREA AS NEEDED  3  . ALPRAZolam (XANAX) 0.5 MG tablet TAKE 1 TABLET BY MOUTH 3 TIMES A DAY AS NEEDED FOR ANXIETY 30 tablet 2  . fluticasone (FLONASE) 50 MCG/ACT nasal spray SPRAY 2 SPRAYS INTO EACH NOSTRIL EVERY DAY 16 g 1  . fluticasone (FLONASE) 50 MCG/ACT nasal spray Place 2 sprays into both nostrils daily. 16 g 1  . HYDROcodone-homatropine (HYCODAN) 5-1.5 MG/5ML syrup Take 5 mLs by mouth every 8 (eight) hours as needed. 100 mL 0  . levocetirizine (XYZAL)  5 MG tablet Take 1 tablet (5 mg total) by mouth every evening. 30 tablet 0  . levothyroxine (SYNTHROID, LEVOTHROID) 25 MCG tablet TAKE 1 TABLET ONCE A DAY ORALLY 90 DAYS  1  . methocarbamol (ROBAXIN) 500 MG tablet Take 1 tablet (500 mg total) by mouth every 8 (eight) hours as needed for muscle spasms. 30 tablet 1  . omeprazole (PRILOSEC) 20 MG capsule TAKE 1 CAPSULE (20 MG TOTAL) BY MOUTH DAILY. 90 capsule 1  . pravastatin (PRAVACHOL) 20 MG tablet Take 1 tablet (20 mg total) by mouth daily. 90 tablet 3  . valACYclovir (VALTREX) 500 MG tablet Take 4 pills (2gm) once, repeat in 12 hours as needed for cold sore 30 tablet 3   No current facility-administered medications on file prior to visit.     Review of Systems:  As per HPI- otherwise negative.   Physical Examination: Vitals:   01/11/18 0826  BP: 102/82  Pulse: 83  Temp: 97.9 F (36.6 C)  SpO2: 97%   Vitals:   01/11/18 0826  Weight: 115 lb 9.6 oz (52.4 kg)  Height: 5\' 1"  (1.549 m)   Body mass index is 21.84 kg/m. Ideal Body Weight: Weight in (lb) to have BMI = 25: 132  GEN: WDWN, NAD, Non-toxic, A & O x 3, looks well, slim build, healthy in appearance  HEENT: Atraumatic, Normocephalic. Neck supple. No masses, No LAD.  Bilateral TM wnl, oropharynx normal.  PEERL,EOMI.   Ears and Nose: No external deformity. CV: RRR, No M/G/R. No JVD. No thrill. No extra heart sounds. PULM: CTA B, no wheezes, crackles, rhonchi. No retractions. No resp. distress. No accessory muscle use. ABD: S, NT, ND. No rebound. No HSM. EXTR: No c/c/e NEURO Normal gait.  PSYCH: Normally interactive. Conversant. Not depressed or anxious appearing.  Calm demeanor.  Breast: normal exam, no masses/ dimpling/ discharge Pelvic: normal, no vaginal lesions or discharge. Uterus normal, no CMT, no adnexal tendereness or masses     Assessment and Plan: Physical exam  Dyslipidemia - Plan: Lipid panel  Screening for cervical cancer - Plan: Cytology -  PAP  Screening for breast cancer - Plan: MM SCREENING BREAST TOMO BILATERAL  Here today for a CPE Doing well overall- See patient instructions for more details.   Given health maint information on her AVS  Signed Abbe Amsterdam, MD

## 2018-01-11 ENCOUNTER — Ambulatory Visit (INDEPENDENT_AMBULATORY_CARE_PROVIDER_SITE_OTHER): Payer: BLUE CROSS/BLUE SHIELD | Admitting: Family Medicine

## 2018-01-11 ENCOUNTER — Other Ambulatory Visit (HOSPITAL_COMMUNITY)
Admission: RE | Admit: 2018-01-11 | Discharge: 2018-01-11 | Disposition: A | Discharge status: Home / Self Care | Payer: BLUE CROSS/BLUE SHIELD | Source: Ambulatory Visit | Attending: Family Medicine | Admitting: Family Medicine

## 2018-01-11 ENCOUNTER — Ambulatory Visit (HOSPITAL_BASED_OUTPATIENT_CLINIC_OR_DEPARTMENT_OTHER)
Admission: RE | Admit: 2018-01-11 | Discharge: 2018-01-11 | Disposition: A | Discharge status: Home / Self Care | Payer: BLUE CROSS/BLUE SHIELD | Source: Ambulatory Visit | Attending: Family Medicine | Admitting: Family Medicine

## 2018-01-11 ENCOUNTER — Encounter (HOSPITAL_BASED_OUTPATIENT_CLINIC_OR_DEPARTMENT_OTHER): Payer: Self-pay

## 2018-01-11 ENCOUNTER — Encounter: Payer: Self-pay | Admitting: Family Medicine

## 2018-01-11 VITALS — BP 102/82 | HR 83 | Temp 97.9°F | Ht 61.0 in | Wt 115.6 lb

## 2018-01-11 DIAGNOSIS — Z1239 Encounter for other screening for malignant neoplasm of breast: Secondary | ICD-10-CM

## 2018-01-11 DIAGNOSIS — E785 Hyperlipidemia, unspecified: Secondary | ICD-10-CM | POA: Diagnosis not present

## 2018-01-11 DIAGNOSIS — Z1231 Encounter for screening mammogram for malignant neoplasm of breast: Secondary | ICD-10-CM

## 2018-01-11 DIAGNOSIS — Z124 Encounter for screening for malignant neoplasm of cervix: Secondary | ICD-10-CM

## 2018-01-11 DIAGNOSIS — Z0000 Encounter for general adult medical examination without abnormal findings: Secondary | ICD-10-CM | POA: Diagnosis not present

## 2018-01-11 NOTE — Patient Instructions (Signed)
Great to see you again today- take care and I will be in touch with your pap Please schedule your mammo asap- you may be able to get this done today!  I will order a lipid panel for you- come in for a fasting blood draw in about 6 months and we can see if we need to try medication again  Health Maintenance, Female Adopting a healthy lifestyle and getting preventive care can go a long way to promote health and wellness. Talk with your health care provider about what schedule of regular examinations is right for you. This is a good chance for you to check in with your provider about disease prevention and staying healthy. In between checkups, there are plenty of things you can do on your own. Experts have done a lot of research about which lifestyle changes and preventive measures are most likely to keep you healthy. Ask your health care provider for more information. Weight and diet Eat a healthy diet  Be sure to include plenty of vegetables, fruits, low-fat dairy products, and lean protein.  Do not eat a lot of foods high in solid fats, added sugars, or salt.  Get regular exercise. This is one of the most important things you can do for your health. ? Most adults should exercise for at least 150 minutes each week. The exercise should increase your heart rate and make you sweat (moderate-intensity exercise). ? Most adults should also do strengthening exercises at least twice a week. This is in addition to the moderate-intensity exercise.  Maintain a healthy weight  Body mass index (BMI) is a measurement that can be used to identify possible weight problems. It estimates body fat based on height and weight. Your health care provider can help determine your BMI and help you achieve or maintain a healthy weight.  For females 2 years of age and older: ? A BMI below 18.5 is considered underweight. ? A BMI of 18.5 to 24.9 is normal. ? A BMI of 25 to 29.9 is considered overweight. ? A BMI of 30 and  above is considered obese.  Watch levels of cholesterol and blood lipids  You should start having your blood tested for lipids and cholesterol at 50 years of age, then have this test every 5 years.  You may need to have your cholesterol levels checked more often if: ? Your lipid or cholesterol levels are high. ? You are older than 50 years of age. ? You are at high risk for heart disease.  Cancer screening Lung Cancer  Lung cancer screening is recommended for adults 18-12 years old who are at high risk for lung cancer because of a history of smoking.  A yearly low-dose CT scan of the lungs is recommended for people who: ? Currently smoke. ? Have quit within the past 15 years. ? Have at least a 30-pack-year history of smoking. A pack year is smoking an average of one pack of cigarettes a day for 1 year.  Yearly screening should continue until it has been 15 years since you quit.  Yearly screening should stop if you develop a health problem that would prevent you from having lung cancer treatment.  Breast Cancer  Practice breast self-awareness. This means understanding how your breasts normally appear and feel.  It also means doing regular breast self-exams. Let your health care provider know about any changes, no matter how small.  If you are in your 20s or 30s, you should have a clinical breast exam (  CBE) by a health care provider every 1-3 years as part of a regular health exam.  If you are 68 or older, have a CBE every year. Also consider having a breast X-ray (mammogram) every year.  If you have a family history of breast cancer, talk to your health care provider about genetic screening.  If you are at high risk for breast cancer, talk to your health care provider about having an MRI and a mammogram every year.  Breast cancer gene (BRCA) assessment is recommended for women who have family members with BRCA-related cancers. BRCA-related cancers  include: ? Breast. ? Ovarian. ? Tubal. ? Peritoneal cancers.  Results of the assessment will determine the need for genetic counseling and BRCA1 and BRCA2 testing.  Cervical Cancer Your health care provider may recommend that you be screened regularly for cancer of the pelvic organs (ovaries, uterus, and vagina). This screening involves a pelvic examination, including checking for microscopic changes to the surface of your cervix (Pap test). You may be encouraged to have this screening done every 3 years, beginning at age 70.  For women ages 56-65, health care providers may recommend pelvic exams and Pap testing every 3 years, or they may recommend the Pap and pelvic exam, combined with testing for human papilloma virus (HPV), every 5 years. Some types of HPV increase your risk of cervical cancer. Testing for HPV may also be done on women of any age with unclear Pap test results.  Other health care providers may not recommend any screening for nonpregnant women who are considered low risk for pelvic cancer and who do not have symptoms. Ask your health care provider if a screening pelvic exam is right for you.  If you have had past treatment for cervical cancer or a condition that could lead to cancer, you need Pap tests and screening for cancer for at least 20 years after your treatment. If Pap tests have been discontinued, your risk factors (such as having a new sexual partner) need to be reassessed to determine if screening should resume. Some women have medical problems that increase the chance of getting cervical cancer. In these cases, your health care provider may recommend more frequent screening and Pap tests.  Colorectal Cancer  This type of cancer can be detected and often prevented.  Routine colorectal cancer screening usually begins at 50 years of age and continues through 50 years of age.  Your health care provider may recommend screening at an earlier age if you have risk factors  for colon cancer.  Your health care provider may also recommend using home test kits to check for hidden blood in the stool.  A small camera at the end of a tube can be used to examine your colon directly (sigmoidoscopy or colonoscopy). This is done to check for the earliest forms of colorectal cancer.  Routine screening usually begins at age 58.  Direct examination of the colon should be repeated every 5-10 years through 50 years of age. However, you may need to be screened more often if early forms of precancerous polyps or small growths are found.  Skin Cancer  Check your skin from head to toe regularly.  Tell your health care provider about any new moles or changes in moles, especially if there is a change in a mole's shape or color.  Also tell your health care provider if you have a mole that is larger than the size of a pencil eraser.  Always use sunscreen. Apply sunscreen liberally  and repeatedly throughout the day.  Protect yourself by wearing long sleeves, pants, a wide-brimmed hat, and sunglasses whenever you are outside.  Heart disease, diabetes, and high blood pressure  High blood pressure causes heart disease and increases the risk of stroke. High blood pressure is more likely to develop in: ? People who have blood pressure in the high end of the normal range (130-139/85-89 mm Hg). ? People who are overweight or obese. ? People who are African American.  If you are 46-16 years of age, have your blood pressure checked every 3-5 years. If you are 10 years of age or older, have your blood pressure checked every year. You should have your blood pressure measured twice-once when you are at a hospital or clinic, and once when you are not at a hospital or clinic. Record the average of the two measurements. To check your blood pressure when you are not at a hospital or clinic, you can use: ? An automated blood pressure machine at a pharmacy. ? A home blood pressure monitor.  If  you are between 28 years and 79 years old, ask your health care provider if you should take aspirin to prevent strokes.  Have regular diabetes screenings. This involves taking a blood sample to check your fasting blood sugar level. ? If you are at a normal weight and have a low risk for diabetes, have this test once every three years after 50 years of age. ? If you are overweight and have a high risk for diabetes, consider being tested at a younger age or more often. Preventing infection Hepatitis B  If you have a higher risk for hepatitis B, you should be screened for this virus. You are considered at high risk for hepatitis B if: ? You were born in a country where hepatitis B is common. Ask your health care provider which countries are considered high risk. ? Your parents were born in a high-risk country, and you have not been immunized against hepatitis B (hepatitis B vaccine). ? You have HIV or AIDS. ? You use needles to inject street drugs. ? You live with someone who has hepatitis B. ? You have had sex with someone who has hepatitis B. ? You get hemodialysis treatment. ? You take certain medicines for conditions, including cancer, organ transplantation, and autoimmune conditions.  Hepatitis C  Blood testing is recommended for: ? Everyone born from [birth year] through 1963. ? Anyone with known risk factors for hepatitis C.  Sexually transmitted infections (STIs)  You should be screened for sexually transmitted infections (STIs) including gonorrhea and chlamydia if: ? You are sexually active and are younger than 50 years of age. ? You are older than 50 years of age and your health care provider tells you that you are at risk for this type of infection. ? Your sexual activity has changed since you were last screened and you are at an increased risk for chlamydia or gonorrhea. Ask your health care provider if you are at risk.  If you do not have HIV, but are at risk, it may be recommended  that you take a prescription medicine daily to prevent HIV infection. This is called pre-exposure prophylaxis (PrEP). You are considered at risk if: ? You are sexually active and do not regularly use condoms or know the HIV status of your partner(s). ? You take drugs by injection. ? You are sexually active with a partner who has HIV.  Talk with your health care provider about whether  you are at high risk of being infected with HIV. If you choose to begin PrEP, you should first be tested for HIV. You should then be tested every 3 months for as long as you are taking PrEP. Pregnancy  If you are premenopausal and you may become pregnant, ask your health care provider about preconception counseling.  If you may become pregnant, take 400 to 800 micrograms (mcg) of folic acid every day.  If you want to prevent pregnancy, talk to your health care provider about birth control (contraception). Osteoporosis and menopause  Osteoporosis is a disease in which the bones lose minerals and strength with aging. This can result in serious bone fractures. Your risk for osteoporosis can be identified using a bone density scan.  If you are 43 years of age or older, or if you are at risk for osteoporosis and fractures, ask your health care provider if you should be screened.  Ask your health care provider whether you should take a calcium or vitamin D supplement to lower your risk for osteoporosis.  Menopause may have certain physical symptoms and risks.  Hormone replacement therapy may reduce some of these symptoms and risks. Talk to your health care provider about whether hormone replacement therapy is right for you. Follow these instructions at home:  Schedule regular health, dental, and eye exams.  Stay current with your immunizations.  Do not use any tobacco products including cigarettes, chewing tobacco, or electronic cigarettes.  If you are pregnant, do not drink alcohol.  If you are  breastfeeding, limit how much and how often you drink alcohol.  Limit alcohol intake to no more than 1 drink per day for nonpregnant women. One drink equals 12 ounces of beer, 5 ounces of wine, or 1 ounces of hard liquor.  Do not use street drugs.  Do not share needles.  Ask your health care provider for help if you need support or information about quitting drugs.  Tell your health care provider if you often feel depressed.  Tell your health care provider if you have ever been abused or do not feel safe at home. This information is not intended to replace advice given to you by your health care provider. Make sure you discuss any questions you have with your health care provider. Document Released: 04/12/2011 Document Revised: 03/04/2016 Document Reviewed: 07/01/2015 Elsevier Interactive Patient Education  [employer].

## 2018-01-12 LAB — CYTOLOGY - PAP
Adequacy: ABSENT
Diagnosis: NEGATIVE
HPV: NOT DETECTED

## 2018-01-13 ENCOUNTER — Encounter: Payer: Self-pay | Admitting: Family Medicine

## 2018-01-17 DIAGNOSIS — E0590 Thyrotoxicosis, unspecified without thyrotoxic crisis or storm: Secondary | ICD-10-CM | POA: Diagnosis not present

## 2018-02-06 ENCOUNTER — Ambulatory Visit (INDEPENDENT_AMBULATORY_CARE_PROVIDER_SITE_OTHER): Payer: BLUE CROSS/BLUE SHIELD

## 2018-02-06 ENCOUNTER — Ambulatory Visit (INDEPENDENT_AMBULATORY_CARE_PROVIDER_SITE_OTHER): Payer: BLUE CROSS/BLUE SHIELD | Admitting: Podiatry

## 2018-02-06 ENCOUNTER — Encounter: Payer: Self-pay | Admitting: Podiatry

## 2018-02-06 ENCOUNTER — Other Ambulatory Visit: Payer: Self-pay | Admitting: Podiatry

## 2018-02-06 VITALS — BP 102/71 | HR 71 | Resp 16

## 2018-02-06 DIAGNOSIS — M79672 Pain in left foot: Secondary | ICD-10-CM

## 2018-02-06 DIAGNOSIS — M779 Enthesopathy, unspecified: Secondary | ICD-10-CM

## 2018-02-06 MED ORDER — TRIAMCINOLONE ACETONIDE 10 MG/ML IJ SUSP
10.0000 mg | Freq: Once | INTRAMUSCULAR | Status: AC
Start: 2018-02-06 — End: 2018-02-06
  Administered 2018-02-06: 10 mg

## 2018-02-06 NOTE — Progress Notes (Signed)
Subjective:   Patient ID: Melanie Taylor, female   DOB: 52 y.o.   MRN: [id number]   HPI Patient presents with pain on top of the foot of 1 month duration.  Does not remember specific injury but she is very active and likes to work out.  Patient states the pain is worse when she first gets on it and its persistent during the day but gets worse as the day lengthens.  Patient does not smoke and likes to be active   Review of Systems  All other systems reviewed and are negative.       Objective:  Physical Exam  Constitutional: She appears well-developed and well-nourished.  Cardiovascular: Intact distal pulses.  Pulmonary/Chest: Effort normal.  Musculoskeletal: Normal range of motion.  Neurological: She is alert.  Skin: Skin is warm.  Nursing note and vitals reviewed.   Neurovascular status intact muscle strength is adequate range of motion within normal limits with patient found to have exquisite discomfort in the dorsum of the left foot around the extensor tendon complex and second third metatarsal shaft with no specific area of shooting discomfort.  She was noted to have good digital perfusion well oriented x3     Assessment:  Probability for dorsal tendinitis left with probable trauma secondary to the types of activities that she does     Plan:  H&P x-ray reviewed and today I did careful injection of the dorsal tendon complex 3 mg Kenalog 5 mg Xylocaine advised on heat ice therapy rigid bottom shoes and will be seen back as needed  X-ray indicates that there is no signs of stress fracture or arthritis of the left foot

## 2018-02-06 NOTE — Progress Notes (Signed)
   Subjective:    Patient ID: Melanie Taylor, female    DOB: 07/11/1966, 52 y.o.   MRN: [id number]  HPI    Review of Systems  All other systems reviewed and are negative.      Objective:   Physical Exam        Assessment & Plan:

## 2018-02-07 ENCOUNTER — Other Ambulatory Visit: Payer: Self-pay | Admitting: Family Medicine

## 2018-02-26 ENCOUNTER — Other Ambulatory Visit: Payer: Self-pay | Admitting: Medical

## 2018-03-09 DIAGNOSIS — E0590 Thyrotoxicosis, unspecified without thyrotoxic crisis or storm: Secondary | ICD-10-CM | POA: Diagnosis not present

## 2018-03-20 ENCOUNTER — Other Ambulatory Visit: Payer: Self-pay | Admitting: Family Medicine

## 2018-03-21 NOTE — Telephone Encounter (Signed)
Requesting:Alprazolam  Contract: none, needs csc UDS:01/06/17 Last Visit:01/11/18 Next Visit:none with pcp Last Refill:12/12/17 2-refills  No discrepancies  Please Advise

## 2018-03-21 NOTE — Telephone Encounter (Signed)
NCCSR will not open at all currently Per my notes refill should be ok

## 2018-03-27 ENCOUNTER — Other Ambulatory Visit: Payer: Self-pay | Admitting: Medical

## 2018-03-28 MED ORDER — LEVOCETIRIZINE DIHYDROCHLORIDE 5 MG PO TABS: ORAL_TABLET | ORAL | 3 refills | Status: DC

## 2018-03-28 NOTE — Telephone Encounter (Signed)
Pt. Placed on xyzal by Esperanza RichtersEdward Saguier, PA. Routed to Dr. Patsy Lageropland, PCP for review.

## 2018-05-10 DIAGNOSIS — E039 Hypothyroidism, unspecified: Secondary | ICD-10-CM | POA: Diagnosis not present

## 2018-05-17 DIAGNOSIS — M25561 Pain in right knee: Secondary | ICD-10-CM | POA: Diagnosis not present

## 2018-05-21 ENCOUNTER — Other Ambulatory Visit: Payer: Self-pay | Admitting: Family Medicine

## 2018-05-23 DIAGNOSIS — M79672 Pain in left foot: Secondary | ICD-10-CM | POA: Diagnosis not present

## 2018-06-19 DIAGNOSIS — M79672 Pain in left foot: Secondary | ICD-10-CM | POA: Diagnosis not present

## 2018-06-23 ENCOUNTER — Encounter: Payer: Self-pay | Admitting: Family Medicine

## 2018-06-23 DIAGNOSIS — M5432 Sciatica, left side: Secondary | ICD-10-CM | POA: Diagnosis not present

## 2018-06-23 DIAGNOSIS — S86812A Strain of other muscle(s) and tendon(s) at lower leg level, left leg, initial encounter: Secondary | ICD-10-CM | POA: Diagnosis not present

## 2018-06-23 DIAGNOSIS — M79662 Pain in left lower leg: Secondary | ICD-10-CM

## 2018-06-27 DIAGNOSIS — M79672 Pain in left foot: Secondary | ICD-10-CM | POA: Diagnosis not present

## 2018-07-03 DIAGNOSIS — M79672 Pain in left foot: Secondary | ICD-10-CM | POA: Diagnosis not present

## 2018-07-04 ENCOUNTER — Other Ambulatory Visit: Payer: Self-pay | Admitting: Family Medicine

## 2018-07-05 ENCOUNTER — Encounter: Payer: Self-pay | Admitting: Family Medicine

## 2018-07-05 NOTE — Telephone Encounter (Signed)
Reviewed NCCSR for her today Time to come in Reminder message sent to pt

## 2018-07-09 NOTE — Progress Notes (Signed)
[facility] [address] [place], [place] [address] [phone] [id number]  Date:  07/12/2018   Name:  Melanie Taylor   DOB:  [birth year]   MRN:  [id number]  PCP:  Pearline Cables, MD    Chief Complaint: Medication Management (viait for xanax script, refills) and Flu Vaccine   History of Present Illness:  Melanie Taylor is a 52 y.o. very pleasant female patient who presents with the following:  Here today to follow-up on her xanax rx which she uses for sleep  Last visit here in April: history of IBS, hyperthyroidism, anxiety, GERD History of thyroidisis and she does see endocrinology-  Talmage Nap is her endocrinologist.  They are working on getting this under control for her. She is just on 25 mcg of levothyroxine right now She did elect to try a cholesterol med- rx for pravachol. However seemed to cause fatigue, we were not sure if due to medication or not. Her lipids have always been good, she would like to repeat in 6 months and then think about trying pravachol again  Her daughter is 92 yo and doing well- they are planning a family trip over Easter break She likes to exercise at [facility] No CP or SOB with exercise She plans to do a colonoscopy when she turns 52 yo The numbness she had in her hands has gotten better again since she is on thyroid meds.  She did have nerve conduction studies which did not show CTS  She had a good summer and did some traveling to the beach  She continues to use xanax for sleep nearly every night She is able to sleep well with this med and otherwise does not do as well- however she does skip it sometimes to make sure that she can still sleep on her own and she can  It helps her to slow her thoughts down so she can get to sleep  No other controled meds used on a chronic basis  She continue to see Dr. Talmage Nap for her thyroid  Complete labs done in March Flu shot due- given today  ?labs/ cholesterol- we  will wait until her CPE in the spring   NCCSR: 07/05/2018  1  07/05/2018  Alprazolam 0.5 Mg Tablet  30.00 10 Je Cop  [id number]  [place] ([id number])  0/0 3.00 LME Comm Ins  [place]  05/31/2018  1  03/21/2018  Alprazolam 0.5 Mg Tablet  30.00 30 Je Cop  [id number]  [place] ([id number])  2/2 1.00 LME Comm Ins  [place]  04/21/2018  1  03/21/2018  Alprazolam 0.5 Mg Tablet  30.00 30 Je Cop  [id number]  [place] ([id number])  1/2 1.00 LME Comm Ins  [place]  03/29/2018  2  03/29/2018  Tramadol Hcl 50 Mg Tablet  60.00 10 Shella Maxim  [id number]  Car ([id number])  0/3 30.00 MME Private Pay  [place]  03/21/2018  1  03/21/2018  Alprazolam 0.5 Mg Tablet  30.00 30 Je Cop  [id number]  [place] ([id number])  0/2 1.00 LME Comm Ins  [place]  02/17/2018  1  12/12/2017  Alprazolam 0.5 Mg Tablet  30.00 10 Je Cop  [id number]  [place] ([id number])  2/2 3.00 LME Comm Ins  [place]  01/13/2018  1  12/12/2017  Alprazolam 0.5 Mg Tablet  30.00 10 Je Cop  [id number]  [place] ([id number])  1/2 3.00 LME Comm Ins  [place]  01/05/2018  1  01/05/2018  Hydrocodone-Homatropine Syrup  100.00 7 Ed Sag  [id number]  [place] ([id number])  0/0        Patient Active Problem List   Diagnosis Date Noted  . IBS (irritable bowel syndrome) 01/02/2016  . Atypical chest pain 06/30/2015  . Hyperthyroidism 05/24/2011  . Fatigue 05/24/2011  . Anxiety 05/24/2011  . Decreased libido 05/24/2011  . GERD 04/10/2008  . ABDOMINAL PAIN-EPIGASTRIC 04/10/2008    Past Medical History:  Diagnosis Date  . Allergy   . Arthritis   . GERD (gastroesophageal reflux disease)   . IBS (irritable bowel syndrome)   . Thyroid disease    hyperthyroidism    Past Surgical History:  Procedure Laterality Date  . AUGMENTATION MAMMAPLASTY    . CESAREAN SECTION  06/2003  . KNEE SURGERY  2003  . SHOULDER SURGERY  2010    Social History   Tobacco Use  . Smoking status: Never Smoker  . Smokeless tobacco: Never Used  Substance Use Topics  . Alcohol use: Yes    Comment: socially  . Drug use: No    Family History  Problem Relation Age of Onset  . Alcohol abuse Father   . Heart  attack Father   . Sudden death Father   . Diabetes Father   . Arthritis Mother   . Hypothyroidism Mother   . Diabetes Mother   . Hypothyroidism Maternal Grandmother   . Cancer Maternal Grandfather        COLON  . Diabetes Other        GRANDFATHER    No Known Allergies  Medication list has been reviewed and updated.  Current Outpatient Medications on File Prior to Visit  Medication Sig Dispense Refill  . ACZONE 7.5 % GEL APPLY TO THE AFFECTED AREA AS NEEDED  3  . ALPRAZolam (XANAX) 0.5 MG tablet TAKE 1 TABLET BY MOUTH THREE TIMES A DAY AS NEEDED FOR ANXIETY.  Visit needed 30 tablet 0  . fluticasone (FLONASE) 50 MCG/ACT nasal spray SPRAY 2 SPRAYS INTO EACH NOSTRIL EVERY DAY 16 g 1  . HYDROcodone-homatropine (HYCODAN) 5-1.5 MG/5ML syrup Take 5 mLs by mouth every 8 (eight) hours as needed. 100 mL 0  . levocetirizine (XYZAL) 5 MG tablet TAKE 1 TABLET BY MOUTH EVERY DAY IN THE EVENING 90 tablet 3  . levothyroxine (SYNTHROID, LEVOTHROID) 25 MCG tablet 75 mcg.   1  . methocarbamol (ROBAXIN) 500 MG tablet Take 1 tablet (500 mg total) by mouth every 8 (eight) hours as needed for muscle spasms. 30 tablet 1  . omeprazole (PRILOSEC) 20 MG capsule TAKE 1 CAPSULE (20 MG TOTAL) BY MOUTH DAILY. 90 capsule 1  . valACYclovir (VALTREX) 500 MG tablet Take 4 pills (2gm) once, repeat in 12 hours as needed for cold sore 30 tablet 3   No current facility-administered medications on file prior to visit.     Review of Systems:  As per HPI- otherwise negative. No fever or chills No CP or SOB  She continues to exercise at [facility]   Physical Examination: Vitals:   07/12/18 1021  BP: 118/70  Pulse: 73  Resp: 16  Temp: (!) 97.3 F (36.3 C)  SpO2: 98%   Vitals:   07/12/18 1021  Weight: 118 lb (53.5 kg)  Height: 5\' 1"  (1.549 m)   Body mass index is 22.3 kg/m. Ideal Body Weight: Weight in (lb) to have BMI = 25: 132  GEN: WDWN, NAD, Non-toxic, A & O x 3, looks well, normal weight  HEENT:  Atraumatic, Normocephalic. Neck supple.  No masses, No LAD. Ears and Nose: No external deformity. CV: RRR, No M/G/R. No JVD. No thrill. No extra heart sounds. PULM: CTA B, no wheezes, crackles, rhonchi. No retractions. No resp. distress. No accessory muscle use. ABD: S, NT, ND, +BS. No rebound. No HSM. EXTR: No c/c/e NEURO Normal gait.  PSYCH: Normally interactive. Conversant. Not depressed or anxious appearing.  Calm demeanor.    Assessment and Plan: Anxiety  Dyslipidemia  Need for influenza vaccination - Plan: Flu Vaccine QUAD 6+ mos PF IM (Fluarix Quad PF)  Following up today Flu shot given Went over her xanax use, no concerns She will see me for a CPE in the spring   Signed Abbe Amsterdam, MD

## 2018-07-12 ENCOUNTER — Encounter: Payer: Self-pay | Admitting: Family Medicine

## 2018-07-12 ENCOUNTER — Ambulatory Visit (INDEPENDENT_AMBULATORY_CARE_PROVIDER_SITE_OTHER): Payer: BLUE CROSS/BLUE SHIELD | Admitting: Family Medicine

## 2018-07-12 VITALS — BP 118/70 | HR 73 | Temp 97.3°F | Resp 16 | Ht 61.0 in | Wt 118.0 lb

## 2018-07-12 DIAGNOSIS — F419 Anxiety disorder, unspecified: Secondary | ICD-10-CM | POA: Diagnosis not present

## 2018-07-12 DIAGNOSIS — Z23 Encounter for immunization: Secondary | ICD-10-CM | POA: Diagnosis not present

## 2018-07-12 DIAGNOSIS — E785 Hyperlipidemia, unspecified: Secondary | ICD-10-CM

## 2018-07-12 NOTE — Patient Instructions (Signed)
Good to see you - let me know if any other concerns, let's plan for a physical and labs in the sprig- March/ April

## 2018-07-29 ENCOUNTER — Other Ambulatory Visit: Payer: Self-pay | Admitting: Family Medicine

## 2018-08-03 ENCOUNTER — Other Ambulatory Visit: Payer: Self-pay | Admitting: Family Medicine

## 2018-08-24 DIAGNOSIS — E0590 Thyrotoxicosis, unspecified without thyrotoxic crisis or storm: Secondary | ICD-10-CM | POA: Diagnosis not present

## 2018-08-31 DIAGNOSIS — E039 Hypothyroidism, unspecified: Secondary | ICD-10-CM | POA: Diagnosis not present

## 2018-10-01 ENCOUNTER — Other Ambulatory Visit: Payer: Self-pay | Admitting: Family Medicine

## 2018-10-02 ENCOUNTER — Encounter: Payer: Self-pay | Admitting: Family Medicine

## 2018-10-11 HISTORY — PX: COLONOSCOPY WITH PROPOFOL: SHX5780

## 2018-11-01 DIAGNOSIS — M545 Low back pain: Secondary | ICD-10-CM | POA: Diagnosis not present

## 2018-11-01 DIAGNOSIS — Z6821 Body mass index (BMI) 21.0-21.9, adult: Secondary | ICD-10-CM | POA: Diagnosis not present

## 2018-11-01 DIAGNOSIS — M4716 Other spondylosis with myelopathy, lumbar region: Secondary | ICD-10-CM | POA: Diagnosis not present

## 2018-11-06 ENCOUNTER — Other Ambulatory Visit: Payer: Self-pay | Admitting: Orthopaedic Surgery

## 2018-11-06 DIAGNOSIS — M545 Low back pain, unspecified: Secondary | ICD-10-CM

## 2018-11-15 ENCOUNTER — Ambulatory Visit
Admission: RE | Admit: 2018-11-15 | Discharge: 2018-11-15 | Disposition: A | Discharge status: Home / Self Care | Payer: BLUE CROSS/BLUE SHIELD | Source: Ambulatory Visit | Attending: Orthopaedic Surgery | Admitting: Orthopaedic Surgery

## 2018-11-15 DIAGNOSIS — M48061 Spinal stenosis, lumbar region without neurogenic claudication: Secondary | ICD-10-CM | POA: Diagnosis not present

## 2018-11-15 DIAGNOSIS — M5126 Other intervertebral disc displacement, lumbar region: Secondary | ICD-10-CM | POA: Diagnosis not present

## 2018-11-15 DIAGNOSIS — M545 Low back pain, unspecified: Secondary | ICD-10-CM

## 2018-11-22 DIAGNOSIS — E7029 Other disorders of tyrosine metabolism: Secondary | ICD-10-CM | POA: Diagnosis not present

## 2018-11-22 DIAGNOSIS — E2839 Other primary ovarian failure: Secondary | ICD-10-CM | POA: Diagnosis not present

## 2018-11-22 DIAGNOSIS — E2740 Unspecified adrenocortical insufficiency: Secondary | ICD-10-CM | POA: Diagnosis not present

## 2018-11-22 DIAGNOSIS — E559 Vitamin D deficiency, unspecified: Secondary | ICD-10-CM | POA: Diagnosis not present

## 2018-11-22 DIAGNOSIS — R789 Finding of unspecified substance, not normally found in blood: Secondary | ICD-10-CM | POA: Diagnosis not present

## 2018-11-22 DIAGNOSIS — N951 Menopausal and female climacteric states: Secondary | ICD-10-CM | POA: Diagnosis not present

## 2018-11-24 DIAGNOSIS — M4716 Other spondylosis with myelopathy, lumbar region: Secondary | ICD-10-CM | POA: Diagnosis not present

## 2018-11-24 DIAGNOSIS — M545 Low back pain: Secondary | ICD-10-CM | POA: Diagnosis not present

## 2018-11-27 DIAGNOSIS — E559 Vitamin D deficiency, unspecified: Secondary | ICD-10-CM | POA: Diagnosis not present

## 2018-11-27 DIAGNOSIS — E038 Other specified hypothyroidism: Secondary | ICD-10-CM | POA: Diagnosis not present

## 2018-11-27 DIAGNOSIS — E539 Vitamin B deficiency, unspecified: Secondary | ICD-10-CM | POA: Diagnosis not present

## 2018-11-27 DIAGNOSIS — E2749 Other adrenocortical insufficiency: Secondary | ICD-10-CM | POA: Diagnosis not present

## 2018-12-05 DIAGNOSIS — E539 Vitamin B deficiency, unspecified: Secondary | ICD-10-CM | POA: Diagnosis not present

## 2018-12-05 DIAGNOSIS — E2749 Other adrenocortical insufficiency: Secondary | ICD-10-CM | POA: Diagnosis not present

## 2018-12-05 DIAGNOSIS — M25521 Pain in right elbow: Secondary | ICD-10-CM | POA: Diagnosis not present

## 2018-12-05 DIAGNOSIS — E559 Vitamin D deficiency, unspecified: Secondary | ICD-10-CM | POA: Diagnosis not present

## 2018-12-06 ENCOUNTER — Other Ambulatory Visit (HOSPITAL_BASED_OUTPATIENT_CLINIC_OR_DEPARTMENT_OTHER): Payer: Self-pay | Admitting: Physical Medicine and Rehabilitation

## 2018-12-06 ENCOUNTER — Ambulatory Visit (HOSPITAL_BASED_OUTPATIENT_CLINIC_OR_DEPARTMENT_OTHER)
Admission: RE | Admit: 2018-12-06 | Discharge: 2018-12-06 | Disposition: A | Discharge status: Home / Self Care | Payer: BLUE CROSS/BLUE SHIELD | Source: Ambulatory Visit | Attending: Physical Medicine and Rehabilitation | Admitting: Physical Medicine and Rehabilitation

## 2018-12-06 DIAGNOSIS — M25521 Pain in right elbow: Secondary | ICD-10-CM

## 2018-12-20 ENCOUNTER — Other Ambulatory Visit: Payer: Self-pay | Admitting: Family Medicine

## 2018-12-20 DIAGNOSIS — B001 Herpesviral vesicular dermatitis: Secondary | ICD-10-CM

## 2018-12-22 ENCOUNTER — Other Ambulatory Visit: Payer: Self-pay | Admitting: Family Medicine

## 2019-01-11 ENCOUNTER — Encounter: Payer: BLUE CROSS/BLUE SHIELD | Admitting: Family Medicine

## 2019-01-15 ENCOUNTER — Other Ambulatory Visit: Payer: Self-pay | Admitting: Family Medicine

## 2019-01-16 ENCOUNTER — Encounter: Payer: Self-pay | Admitting: Family Medicine

## 2019-01-16 NOTE — Telephone Encounter (Signed)
Patient scheduled for Thursday

## 2019-01-17 ENCOUNTER — Encounter: Payer: BLUE CROSS/BLUE SHIELD | Admitting: Family Medicine

## 2019-01-17 NOTE — Progress Notes (Deleted)
[facility] [address], [address] [place], [place] [address] [phone] [phone]  Date:  01/18/2019   Name:  Melanie Mylaraula B Sem   DOB:  19-May-1966   MRN:  [id number]  PCP:  Pearline Cablesopland,  C, MD    Chief Complaint: No chief complaint on file.   History of Present Illness:  Melanie Taylor is a 53 y.o. very pleasant female patient who presents with the following:  Virtual visit today due to COVID-19 outbreak Patient with history of IBS, hypothyroidism/thyroiditis, GERD, anxiety I last saw her in October She does tend to use Xanax at bedtime for sleep Dr. Talmage NapBalan is helping with her thyroid  We had planned to do full labs for her this spring   12/20/2018  1   09/03/2018  Alprazolam 0.25 MG Tablet  60.00 30 Je Cop   [id number]   [place] ([id number])   3  1.00 LME  Comm Ins   [place]  10/30/2018  1   09/03/2018  Alprazolam 0.25 MG Tablet  60.00 30 Je Cop   [id number]   [place] ([id number])   2  1.00 LME  Comm Ins   [place]  10/01/2018  1   09/03/2018  Alprazolam 0.25 MG Tablet  60.00 30 Je Cop   [id number]   [place] ([id number])   1  1.00 LME  Comm Ins   [place]  09/03/2018  1   09/03/2018  Alprazolam 0.25 MG Tablet  60.00 30 Je Cop   [id number]   [place] ([id number])   0  1.00 LME  Comm Ins   [place]  08/05/2018  1   08/05/2018  Alprazolam 0.5 MG Tablet  30.00 30 Je Cop   [id number]   [place] ([id number])   0  1.00 LME  Comm Ins   [place]  07/05/2018  1   07/05/2018  Alprazolam 0.5 MG Tablet  30.00 10 Je Cop   [id number]   [place] ([id number])   0  3.00 LME  Comm Ins   [place]  05/31/2018  1   03/21/2018  Alprazolam 0.5 MG Tablet  30.00 30 Je Cop   [id number]   [place] ([id number])   2  1.00 LME  Comm Ins   [place]  04/21/2018  1   03/21/2018  Alprazolam 0.5 MG Tablet  30.00 30 Je Cop   [id number]   [place] ([id number])   1  1.00 LME       Patient Active Problem List   Diagnosis Date Noted  . IBS (irritable bowel syndrome) 01/02/2016  . Atypical chest pain 06/30/2015  . Hyperthyroidism 05/24/2011  . Fatigue 05/24/2011  . Anxiety 05/24/2011  . Decreased libido  05/24/2011  . GERD 04/10/2008  . ABDOMINAL PAIN-EPIGASTRIC 04/10/2008    Past Medical History:  Diagnosis Date  . Allergy   . Arthritis   . GERD (gastroesophageal reflux disease)   . IBS (irritable bowel syndrome)   . Thyroid disease    hyperthyroidism    Past Surgical History:  Procedure Laterality Date  . AUGMENTATION MAMMAPLASTY    . CESAREAN SECTION  06/2003  . KNEE SURGERY  2003  . SHOULDER SURGERY  2010    Social History   Tobacco Use  . Smoking status: Never Smoker  . Smokeless tobacco: Never Used  Substance Use Topics  . Alcohol use: Yes    Comment: socially  . Drug use: No    Family History  Problem Relation Age of Onset  . Alcohol abuse  Father   . Heart attack Father   . Sudden death Father   . Diabetes Father   . Arthritis Mother   . Hypothyroidism Mother   . Diabetes Mother   . Hypothyroidism Maternal Grandmother   . Cancer Maternal Grandfather        COLON  . Diabetes Other        GRANDFATHER    No Known Allergies  Medication list has been reviewed and updated.  Current Outpatient Medications on File Prior to Visit  Medication Sig Dispense Refill  . ACZONE 7.5 % GEL APPLY TO THE AFFECTED AREA AS NEEDED  3  . ALPRAZolam (XANAX) 0.5 MG tablet TAKE 1 TABLET BY MOUTH THREE TIMES A DAY AS NEEDED FOR ANXIETY. VISIT NEEDED 30 tablet 5  . fluticasone (FLONASE) 50 MCG/ACT nasal spray SPRAY 2 SPRAYS INTO EACH NOSTRIL EVERY DAY 16 g 5  . HYDROcodone-homatropine (HYCODAN) 5-1.5 MG/5ML syrup Take 5 mLs by mouth every 8 (eight) hours as needed. 100 mL 0  . levocetirizine (XYZAL) 5 MG tablet TAKE 1 TABLET BY MOUTH EVERY DAY IN THE EVENING 90 tablet 3  . levothyroxine (SYNTHROID, LEVOTHROID) 25 MCG tablet 75 mcg.   1  . methocarbamol (ROBAXIN) 500 MG tablet Take 1 tablet (500 mg total) by mouth every 8 (eight) hours as needed for muscle spasms. 30 tablet 1  . omeprazole (PRILOSEC) 20 MG capsule TAKE 1 CAPSULE (20 MG TOTAL) BY MOUTH DAILY. 90 capsule 3  .  valACYclovir (VALTREX) 500 MG tablet TAKE 4 TABLETS (2GM) ONCE, REPEAT IN 12 HOURS AS NEEDED FOR COLD SORE 30 tablet 3   No current facility-administered medications on file prior to visit.     Review of Systems:  As per HPI- otherwise negative.   Physical Examination: There were no vitals filed for this visit. There were no vitals filed for this visit. There is no height or weight on file to calculate BMI. Ideal Body Weight:    ***  Assessment and Plan: ***  Signed Abbe Amsterdam, MD

## 2019-01-18 ENCOUNTER — Encounter: Payer: Self-pay | Admitting: Family Medicine

## 2019-01-18 ENCOUNTER — Other Ambulatory Visit (HOSPITAL_BASED_OUTPATIENT_CLINIC_OR_DEPARTMENT_OTHER): Payer: Self-pay | Admitting: Family Medicine

## 2019-01-18 ENCOUNTER — Ambulatory Visit: Payer: BLUE CROSS/BLUE SHIELD | Admitting: Family Medicine

## 2019-01-18 ENCOUNTER — Ambulatory Visit (INDEPENDENT_AMBULATORY_CARE_PROVIDER_SITE_OTHER): Payer: BLUE CROSS/BLUE SHIELD | Admitting: Family Medicine

## 2019-01-18 ENCOUNTER — Other Ambulatory Visit: Payer: Self-pay

## 2019-01-18 DIAGNOSIS — Z1231 Encounter for screening mammogram for malignant neoplasm of breast: Secondary | ICD-10-CM

## 2019-01-18 DIAGNOSIS — F419 Anxiety disorder, unspecified: Secondary | ICD-10-CM | POA: Diagnosis not present

## 2019-01-18 DIAGNOSIS — E079 Disorder of thyroid, unspecified: Secondary | ICD-10-CM | POA: Diagnosis not present

## 2019-01-18 DIAGNOSIS — J301 Allergic rhinitis due to pollen: Secondary | ICD-10-CM

## 2019-01-18 MED ORDER — LEVOCETIRIZINE DIHYDROCHLORIDE 5 MG PO TABS: ORAL_TABLET | ORAL | 3 refills | Status: DC

## 2019-01-18 MED ORDER — ALPRAZOLAM 0.5 MG PO TABS: ORAL_TABLET | ORAL | 5 refills | Status: DC

## 2019-01-18 NOTE — Patient Instructions (Signed)
It was very nice to talk with you today, I look forward to seeing you in August Let me know if you need anything in the meantime, or if you need me to order thyroid labs

## 2019-01-18 NOTE — Progress Notes (Signed)
[facility] [address], [address] [place], [place] [address] [phone]  Date:  01/18/2019   Name:  Melanie Taylor   DOB:  [birth year]   MRN:  [id number]  PCP:  Pearline Cables, MD    Chief Complaint: No chief complaint on file.   History of Present Illness:  Melanie Taylor is a 53 y.o. very pleasant female patient who presents with the following:  Virtual visit today due to COVID-19 outbreak Patient location- home  Provider location- office Patient ID confirmed with name and date of birth Patient consented to video visit today  She is trying to work from home which is going ok Following up today to discuss medications We plan to do a CPE, but this is delayed due to COVID-19 outbreak-we scheduled her for August today I last saw this patient in October of last year at which point she was using Xanax to sleep most nights but not every night She sees endocrinology, Dr. Talmage Nap, for her thyroid concerns  Her anxiety has been under ok control- has not gotten much worse due to current circumstances She continues to use xanax perhaps 3x a week for sleep, but might be as much as 5 or 6 times a week if things are more intense.  She also will take a pill during the day sometimes.  Overall she uses about 60 pills every 4 to 6 weeks  She wants to update me on some new medications today She is on synthroid 50 mcg She is using a 2nd thyroid med (inactive thyroid) as well through Dr. Eduard Clos, who provides more alternative therapies.  She does feel like this is working- however she has not checked her BW recently due to covid outbreak  She hopes that this combination will give her more stability of her thyroid levels  They also added progesterone 100 mg at bedtime daily - this has stopped her hot flashes  Also DHEA supplement which is compounded  Also also vit D and K Also a testosterone pellet which was placed subcutaneously. Renae Fickle  understands that I am not managing these other medications, she just wanted to make sure that we were aware  12/20/2018  1   09/03/2018  Alprazolam 0.25 MG Tablet  60.00 30 Je Cop   [id number]   [place] ([id number])   3  1.00 LME  Comm Ins   [place]  10/30/2018  1   09/03/2018  Alprazolam 0.25 MG Tablet  60.00 30 Je Cop   [id number]   [place] ([id number])   2  1.00 LME  Comm Ins   [place]  10/01/2018  1   09/03/2018  Alprazolam 0.25 MG Tablet  60.00 30 Je Cop   [id number]   [place] (2879)   1  1.00 LME  Comm Ins   [place]  09/03/2018  1   09/03/2018  Alprazolam 0.25 MG Tablet  60.00 30 Je Cop   [id number]   [place] (2879)   0  1.00 LME  Comm Ins   [place]  08/05/2018  1   08/05/2018  Alprazolam 0.5 MG Tablet  30.00 30 Je Cop   [id number]   [place] (2879)   0  1.00 LME  Comm Ins   [place]  07/05/2018  1   07/05/2018  Alprazolam 0.5 MG Tablet  30.00 10 Je Cop   [id number]   [place] (2879)   0  3.00 LME  Comm Ins   [place]  05/31/2018  1   03/21/2018  Alprazolam 0.5 MG Tablet  30.00 30 Je Cop   [id number]   [place] (2879)   2  1.00 LME  Comm Ins   [place]  04/21/2018  1   03/21/2018  Alprazolam 0.5 MG Tablet  30.00 30 Je Cop   [id number]   [place] ([id number])   1  1.00 LME      Patient Active Problem List   Diagnosis Date Noted  . IBS (irritable bowel syndrome) 01/02/2016  . Atypical chest pain 06/30/2015  . Hyperthyroidism 05/24/2011  . Fatigue 05/24/2011  . Anxiety 05/24/2011  . Decreased libido 05/24/2011  . GERD 04/10/2008  . ABDOMINAL PAIN-EPIGASTRIC 04/10/2008    Past Medical History:  Diagnosis Date  . Allergy   . Arthritis   . GERD (gastroesophageal reflux disease)   . IBS (irritable bowel syndrome)   . Thyroid disease    hyperthyroidism    Past Surgical History:  Procedure Laterality Date  . AUGMENTATION MAMMAPLASTY    . CESAREAN SECTION  06/2003  . KNEE SURGERY  2003  . SHOULDER SURGERY  2010    Social History   Tobacco Use  . Smoking status: Never Smoker  . Smokeless tobacco: Never Used  Substance Use Topics  . Alcohol use: Yes    Comment: socially  . Drug  use: No    Family History  Problem Relation Age of Onset  . Alcohol abuse Father   . Heart attack Father   . Sudden death Father   . Diabetes Father   . Arthritis Mother   . Hypothyroidism Mother   . Diabetes Mother   . Hypothyroidism Maternal Grandmother   . Cancer Maternal Grandfather        COLON  . Diabetes Other        GRANDFATHER    No Known Allergies  Medication list has been reviewed and updated.  Current Outpatient Medications on File Prior to Visit  Medication Sig Dispense Refill  . ACZONE 7.5 % GEL APPLY TO THE AFFECTED AREA AS NEEDED  3  . fluticasone (FLONASE) 50 MCG/ACT nasal spray SPRAY 2 SPRAYS INTO EACH NOSTRIL EVERY DAY 16 g 5  . HYDROcodone-homatropine (HYCODAN) 5-1.5 MG/5ML syrup Take 5 mLs by mouth every 8 (eight) hours as needed. 100 mL 0  . levothyroxine (SYNTHROID, LEVOTHROID) 25 MCG tablet 75 mcg.   1  . methocarbamol (ROBAXIN) 500 MG tablet Take 1 tablet (500 mg total) by mouth every 8 (eight) hours as needed for muscle spasms. 30 tablet 1  . omeprazole (PRILOSEC) 20 MG capsule TAKE 1 CAPSULE (20 MG TOTAL) BY MOUTH DAILY. 90 capsule 3  . valACYclovir (VALTREX) 500 MG tablet TAKE 4 TABLETS (2GM) ONCE, REPEAT IN 12 HOURS AS NEEDED FOR COLD SORE 30 tablet 3   No current facility-administered medications on file prior to visit.     Review of Systems:  As per HPI- otherwise negative. No fever or cough  Physical Examination: There were no vitals filed for this visit. There were no vitals filed for this visit. There is no height or weight on file to calculate BMI. Ideal Body Weight:    She does not check home vitals  Observed patient over await video today.  She looks well, no cough, wheezing, tachypnea is apparent.  No distress  Assessment and Plan: Anxiety - Plan: ALPRAZolam (XANAX) 0.5 MG tablet  Seasonal allergic rhinitis due to pollen - Plan: levocetirizine (XYZAL) 5 MG tablet  Thyroid disorder  Discussed her  anxiety today.  Her  symptoms have been stable.  I refilled her Xanax to use per her normal routine Refill her Xyzal for allergies She has been to see Dr. Eduard ClosBethea, who does more alternative type of treatment.  I do not think any of what she is doing will be harmful to her We plan for Renae Fickleaul to come in during August, to do a routine physical and labs If she wishes to get her thyroid checked in the meantime, I can order it for her She will get a mammogram when possible  Signed Abbe AmsterdamJessica Donie Lemelin, MD

## 2019-02-01 DIAGNOSIS — M25521 Pain in right elbow: Secondary | ICD-10-CM | POA: Diagnosis not present

## 2019-02-01 DIAGNOSIS — M7711 Lateral epicondylitis, right elbow: Secondary | ICD-10-CM | POA: Diagnosis not present

## 2019-02-08 ENCOUNTER — Encounter: Payer: Self-pay | Admitting: Family Medicine

## 2019-02-08 DIAGNOSIS — R7303 Prediabetes: Secondary | ICD-10-CM

## 2019-02-08 DIAGNOSIS — Z130 Encounter for screening for diseases of the blood and blood-forming organs and certain disorders involving the immune mechanism: Secondary | ICD-10-CM

## 2019-02-08 DIAGNOSIS — E559 Vitamin D deficiency, unspecified: Secondary | ICD-10-CM

## 2019-02-08 DIAGNOSIS — E038 Other specified hypothyroidism: Secondary | ICD-10-CM

## 2019-02-08 DIAGNOSIS — E280 Estrogen excess: Secondary | ICD-10-CM

## 2019-02-08 DIAGNOSIS — E065 Other chronic thyroiditis: Secondary | ICD-10-CM

## 2019-02-08 DIAGNOSIS — E539 Vitamin B deficiency, unspecified: Secondary | ICD-10-CM

## 2019-02-08 DIAGNOSIS — Z13220 Encounter for screening for lipoid disorders: Secondary | ICD-10-CM

## 2019-02-08 DIAGNOSIS — E2839 Other primary ovarian failure: Secondary | ICD-10-CM

## 2019-02-08 DIAGNOSIS — N951 Menopausal and female climacteric states: Secondary | ICD-10-CM

## 2019-02-08 DIAGNOSIS — E2749 Other adrenocortical insufficiency: Secondary | ICD-10-CM

## 2019-02-08 NOTE — Addendum Note (Signed)
Addended by: Pearline Cables on: 02/08/2019 06:45 PM   Modules accepted: Orders

## 2019-02-16 NOTE — Addendum Note (Signed)
Addended by: Mervin Kung A on: 02/16/2019 10:04 AM   Modules accepted: Orders

## 2019-02-21 ENCOUNTER — Encounter: Payer: Self-pay | Admitting: Family Medicine

## 2019-02-22 ENCOUNTER — Encounter: Payer: Self-pay | Admitting: Family Medicine

## 2019-02-22 ENCOUNTER — Other Ambulatory Visit (INDEPENDENT_AMBULATORY_CARE_PROVIDER_SITE_OTHER): Payer: BLUE CROSS/BLUE SHIELD

## 2019-02-22 ENCOUNTER — Ambulatory Visit (INDEPENDENT_AMBULATORY_CARE_PROVIDER_SITE_OTHER): Payer: BLUE CROSS/BLUE SHIELD | Admitting: Family Medicine

## 2019-02-22 ENCOUNTER — Other Ambulatory Visit: Payer: Self-pay

## 2019-02-22 ENCOUNTER — Telehealth: Payer: Self-pay

## 2019-02-22 VITALS — BP 108/64 | HR 81 | Temp 98.0°F | Resp 16 | Ht 61.0 in | Wt 111.4 lb

## 2019-02-22 DIAGNOSIS — N951 Menopausal and female climacteric states: Secondary | ICD-10-CM

## 2019-02-22 DIAGNOSIS — R7303 Prediabetes: Secondary | ICD-10-CM | POA: Diagnosis not present

## 2019-02-22 DIAGNOSIS — N907 Vulvar cyst: Secondary | ICD-10-CM

## 2019-02-22 DIAGNOSIS — E038 Other specified hypothyroidism: Secondary | ICD-10-CM

## 2019-02-22 DIAGNOSIS — E559 Vitamin D deficiency, unspecified: Secondary | ICD-10-CM

## 2019-02-22 DIAGNOSIS — Z13220 Encounter for screening for lipoid disorders: Secondary | ICD-10-CM | POA: Diagnosis not present

## 2019-02-22 DIAGNOSIS — E2749 Other adrenocortical insufficiency: Secondary | ICD-10-CM | POA: Diagnosis not present

## 2019-02-22 DIAGNOSIS — E280 Estrogen excess: Secondary | ICD-10-CM

## 2019-02-22 DIAGNOSIS — Z130 Encounter for screening for diseases of the blood and blood-forming organs and certain disorders involving the immune mechanism: Secondary | ICD-10-CM

## 2019-02-22 DIAGNOSIS — E2839 Other primary ovarian failure: Secondary | ICD-10-CM

## 2019-02-22 LAB — T3, FREE: T3, Free: 3.3 pg/mL (ref 2.3–4.2)

## 2019-02-22 LAB — LIPID PANEL
Cholesterol: 196 mg/dL (ref 0–200)
HDL: 70.4 mg/dL (ref >39.00)
LDL Cholesterol: 114 mg/dL — ABNORMAL HIGH (ref 0–99)
NonHDL: 125.98
Total CHOL/HDL Ratio: 3
Triglycerides: 60 mg/dL (ref 0.0–149.0)
VLDL: 12 mg/dL (ref 0.0–40.0)

## 2019-02-22 LAB — HEMOGLOBIN A1C: Hgb A1c MFr Bld: 5.7 % (ref 4.6–6.5)

## 2019-02-22 LAB — COMPREHENSIVE METABOLIC PANEL
ALT: 14 U/L (ref 0–35)
AST: 19 U/L (ref 0–37)
Albumin: 4.5 g/dL (ref 3.5–5.2)
Alkaline Phosphatase: 79 U/L (ref 39–117)
BUN: 23 mg/dL (ref 6–23)
CO2: 31 mEq/L (ref 19–32)
Calcium: 9.5 mg/dL (ref 8.4–10.5)
Chloride: 103 mEq/L (ref 96–112)
Creatinine, Ser: 1.02 mg/dL (ref 0.40–1.20)
GFR: 57.25 mL/min — ABNORMAL LOW (ref >60.00)
Glucose, Bld: 89 mg/dL (ref 70–99)
Potassium: 4.4 mEq/L (ref 3.5–5.1)
Sodium: 141 mEq/L (ref 135–145)
Total Bilirubin: 0.5 mg/dL (ref 0.2–1.2)
Total Protein: 6.9 g/dL (ref 6.0–8.3)

## 2019-02-22 LAB — TSH: TSH: 2.87 u[IU]/mL (ref 0.35–4.50)

## 2019-02-22 LAB — CBC
HCT: 44.4 % (ref 36.0–46.0)
Hemoglobin: 15.2 g/dL — ABNORMAL HIGH (ref 12.0–15.0)
MCHC: 34.3 g/dL (ref 30.0–36.0)
MCV: 90.6 fl (ref 78.0–100.0)
Platelets: 269 10*3/uL (ref 150.0–400.0)
RBC: 4.9 Mil/uL (ref 3.87–5.11)
RDW: 14.3 % (ref 11.5–15.5)
WBC: 3.9 10*3/uL — ABNORMAL LOW (ref 4.0–10.5)

## 2019-02-22 LAB — T4, FREE: Free T4: 0.76 ng/dL (ref 0.60–1.60)

## 2019-02-22 LAB — VITAMIN D 25 HYDROXY (VIT D DEFICIENCY, FRACTURES): VITD: 108.79 ng/mL (ref 30.00–100.00)

## 2019-02-22 NOTE — Progress Notes (Addendum)
[facility] [address], [address] [place], [place] [phone] [phone]  Date:  02/22/2019   Name:  Melanie Taylor   DOB:  [birth year]   MRN:  [id number]  PCP:  Pearline Cablesopland, Kavian Peters C, MD    Chief Complaint: Vaginal Lesion (bump on vaginal area, just noticed a day ago, feels like a "knot" , no pain)   History of Present Illness:  Melanie Taylor is a 53 y.o. very pleasant female patient who presents with the following:  In person visit today due to concern about bump on vulva  She noted it just 2-3 days ago-however she is not certain how long it might of been present.  Feels like a small smooth bump in the left labia majora. She thought it might be an ingrown hair but it is not tender, she did not see any sort of head on it  She is otherwise feeling well No urinary sx   We did a pap for her last year - it was negative with neg HPV   We did lab work today for her, she is receiving some alternative hormonal therapies from a different physician. She did have a testosterone pellet placed under her skin in February.   Patient Active Problem List   Diagnosis Date Noted  . IBS (irritable bowel syndrome) 01/02/2016  . Atypical chest pain 06/30/2015  . Hyperthyroidism 05/24/2011  . Fatigue 05/24/2011  . Anxiety 05/24/2011  . Decreased libido 05/24/2011  . GERD 04/10/2008  . ABDOMINAL PAIN-EPIGASTRIC 04/10/2008    Past Medical History:  Diagnosis Date  . Allergy   . Arthritis   . GERD (gastroesophageal reflux disease)   . IBS (irritable bowel syndrome)   . Thyroid disease    hyperthyroidism    Past Surgical History:  Procedure Laterality Date  . AUGMENTATION MAMMAPLASTY    . CESAREAN SECTION  06/2003  . KNEE SURGERY  2003  . SHOULDER SURGERY  2010    Social History   Tobacco Use  . Smoking status: Never Smoker  . Smokeless tobacco: Never Used  Substance Use Topics  . Alcohol use: Yes    Comment: socially  . Drug  use: No    Family History  Problem Relation Age of Onset  . Alcohol abuse Father   . Heart attack Father   . Sudden death Father   . Diabetes Father   . Arthritis Mother   . Hypothyroidism Mother   . Diabetes Mother   . Hypothyroidism Maternal Grandmother   . Cancer Maternal Grandfather        COLON  . Diabetes Other        GRANDFATHER    No Known Allergies  Medication list has been reviewed and updated.  Current Outpatient Medications on File Prior to Visit  Medication Sig Dispense Refill  . ACZONE 7.5 % GEL APPLY TO THE AFFECTED AREA AS NEEDED  3  . ALPRAZolam (XANAX) 0.5 MG tablet Take 1 tablet by mouth three times a day as needed for anxiety or sleep 60 tablet 5  . fluticasone (FLONASE) 50 MCG/ACT nasal spray SPRAY 2 SPRAYS INTO EACH NOSTRIL EVERY DAY 16 g 5  . HYDROcodone-homatropine (HYCODAN) 5-1.5 MG/5ML syrup Take 5 mLs by mouth every 8 (eight) hours as needed. 100 mL 0  . levocetirizine (XYZAL) 5 MG tablet TAKE 1 TABLET BY MOUTH EVERY DAY IN THE EVENING 90 tablet 3  . levothyroxine (SYNTHROID, LEVOTHROID) 25 MCG tablet  75 mcg.   1  . methocarbamol (ROBAXIN) 500 MG tablet Take 1 tablet (500 mg total) by mouth every 8 (eight) hours as needed for muscle spasms. 30 tablet 1  . omeprazole (PRILOSEC) 20 MG capsule TAKE 1 CAPSULE (20 MG TOTAL) BY MOUTH DAILY. 90 capsule 3  . valACYclovir (VALTREX) 500 MG tablet TAKE 4 TABLETS (2GM) ONCE, REPEAT IN 12 HOURS AS NEEDED FOR COLD SORE 30 tablet 3   No current facility-administered medications on file prior to visit.     Review of Systems:  As per HPI- otherwise negative. No fever or chills, she is feeling well  Physical Examination: Vitals:   02/22/19 0951  BP: 108/64  Pulse: 81  Resp: 16  Temp: 98 F (36.7 C)  SpO2: 100%   Vitals:   02/22/19 0951  Weight: 111 lb 6.4 oz (50.5 kg)  Height: 5\' 1"  (1.549 m)   Body mass index is 21.05 kg/m. Ideal Body Weight: Weight in (lb) to have BMI = 25: 132  GEN: WDWN, NAD,  Non-toxic, A & O x 3, petite build, looks well HEENT: Atraumatic, Normocephalic. Neck supple. No masses, No LAD. Ears and Nose: No external deformity. CV: RRR, No M/G/R. No JVD. No thrill. No extra heart sounds. PULM: CTA B, no wheezes, crackles, rhonchi. No retractions. No resp. distress. No accessory muscle use. ABD: S, NT, ND. No rebound. No HSM. EXTR: No c/c/e NEURO Normal gait.  PSYCH: Normally interactive. Conversant. Not depressed or anxious appearing.  Calm demeanor.  The left labia majora displays a palpable, smooth, mobile mass, seems consistent with cyst.  Approximately 4 mm in diameter. It is not tender, there is no redness or heat, I do not see any pore to suggest a sebaceous cyst  Assessment and Plan: Cyst, vulva  In office visit today due to concern of a cyst on her vulva, left labia.  This seems to be a benign cyst.  We decided on observation for now, she is scheduled for physical in August and we can reexamine if still present at that time.  It may also develop into a sebaceous cyst- I advised patient that if she is sees an obvious pore/head, or begins to drain please let me know  Signed Abbe Amsterdam, MD  Received a critical lab report regarding her vitamin D  Results for orders placed or performed in visit on 02/22/19  Lipid panel  Result Value Ref Range   Cholesterol 196 0 - 200 mg/dL   Triglycerides 35.5 0.0 - 149.0 mg/dL   HDL 97.41 >63.84 mg/dL   VLDL 53.6 0.0 - 46.8 mg/dL   LDL Cholesterol 032 (H) 0 - 99 mg/dL   Total CHOL/HDL Ratio 3    NonHDL 125.98   Hemoglobin A1c  Result Value Ref Range   Hgb A1c MFr Bld 5.7 4.6 - 6.5 %  Comprehensive metabolic panel  Result Value Ref Range   Sodium 141 135 - 145 mEq/L   Potassium 4.4 3.5 - 5.1 mEq/L   Chloride 103 96 - 112 mEq/L   CO2 31 19 - 32 mEq/L   Glucose, Bld 89 70 - 99 mg/dL   BUN 23 6 - 23 mg/dL   Creatinine, Ser 1.22 0.40 - 1.20 mg/dL   Total Bilirubin 0.5 0.2 - 1.2 mg/dL   Alkaline Phosphatase 79  39 - 117 U/L   AST 19 0 - 37 U/L   ALT 14 0 - 35 U/L   Total Protein 6.9 6.0 - 8.3 g/dL  Albumin 4.5 3.5 - 5.2 g/dL   Calcium 9.5 8.4 - 16.1 mg/dL   GFR 09.60 (L) >45.40 mL/min  CBC  Result Value Ref Range   WBC 3.9 (L) 4.0 - 10.5 K/uL   RBC 4.90 3.87 - 5.11 Mil/uL   Platelets 269.0 150.0 - 400.0 K/uL   Hemoglobin 15.2 (H) 12.0 - 15.0 g/dL   HCT 98.1 19.1 - 47.8 %   MCV 90.6 78.0 - 100.0 fl   MCHC 34.3 30.0 - 36.0 g/dL   RDW 29.5 62.1 - 30.8 %  Vitamin D (25 hydroxy)  Result Value Ref Range   VITD 108.79 (HH) 30.00 - 100.00 ng/mL  T4, free  Result Value Ref Range   Free T4 0.76 0.60 - 1.60 ng/dL  T3, free  Result Value Ref Range   T3, Free 3.3 2.3 - 4.2 pg/mL  TSH  Result Value Ref Range   TSH 2.87 0.35 - 4.50 uIU/mL   Called her- she is taking 10,000iu of vit D Will have her DC totally for 2 weeks, then start back at 2000 international units.  She states understanding

## 2019-02-22 NOTE — Patient Instructions (Signed)
It was good to see you today!  I think the bump on your vulva is a benign cyst - please monitor it, and let me know if any changes or concerns.  It may resolve on it's own. Otherwise we can check it again at our appt in August

## 2019-02-22 NOTE — Telephone Encounter (Signed)
Received critical lab result regarding patients vitamin D level. Vitamin D level today was 108.79. PCP notified.

## 2019-02-23 ENCOUNTER — Other Ambulatory Visit: Payer: Self-pay | Admitting: Family Medicine

## 2019-02-23 DIAGNOSIS — T452X1A Poisoning by vitamins, accidental (unintentional), initial encounter: Secondary | ICD-10-CM

## 2019-02-25 LAB — PROGESTERONE: Progesterone: 11.5 ng/mL

## 2019-02-25 LAB — DHEA-SULFATE: DHEA-SO4: 38 ug/dL (ref 19–231)

## 2019-02-25 LAB — ESTRADIOL: Estradiol: 44 pg/mL

## 2019-02-25 LAB — T3, REVERSE: T3, Reverse: 13 ng/dL (ref 8–25)

## 2019-02-25 LAB — TESTOSTERONE, FREE: TESTOSTERONE FREE: 4.3 pg/mL (ref 0.2–5.0)

## 2019-03-01 DIAGNOSIS — M7711 Lateral epicondylitis, right elbow: Secondary | ICD-10-CM | POA: Diagnosis not present

## 2019-03-15 ENCOUNTER — Encounter: Payer: Self-pay | Admitting: Family Medicine

## 2019-03-21 NOTE — Telephone Encounter (Signed)
Dr Lorelei Pont -- Per Lala Lund at [place]. They have already filed code E03.8 with no reimbursement from Insurance as well as N95.1, E27.49 and E28.0. I advised [facility] we have no additional codes to submit at this time.

## 2019-04-06 DIAGNOSIS — M25521 Pain in right elbow: Secondary | ICD-10-CM | POA: Diagnosis not present

## 2019-04-06 DIAGNOSIS — M7711 Lateral epicondylitis, right elbow: Secondary | ICD-10-CM | POA: Diagnosis not present

## 2019-04-26 ENCOUNTER — Encounter: Payer: Self-pay | Admitting: Family Medicine

## 2019-04-26 ENCOUNTER — Encounter: Payer: Self-pay | Admitting: Internal Medicine

## 2019-04-26 DIAGNOSIS — Z1211 Encounter for screening for malignant neoplasm of colon: Secondary | ICD-10-CM

## 2019-05-11 DIAGNOSIS — M25521 Pain in right elbow: Secondary | ICD-10-CM | POA: Diagnosis not present

## 2019-05-16 NOTE — Progress Notes (Deleted)
[facility] [address], [address] [place], [place] [address] [phone] [phone]  Date:  05/21/2019   Name:  Melanie Taylor   DOB:  1966-05-03   MRN:  [id number]  PCP:  Pearline Cablesopland, Kumiko Fishman C, MD    Chief Complaint: No chief complaint on file.   History of Present Illness:  Melanie Mylaraula B Chauca is a 53 y.o. very pleasant female patient who presents with the following:  Here today for a CPE History of IBS, hyperthyroidism/ thyroiditis followed by endocrinology, GERD In May we saw her for a cyst on her vulva, will recheck for her today   Tetanus: due - may need tdap Suggest shingrix  Mammo: this week Pap: UTD Colon: UTD, due in 2022 Labs: done in May.  Can recheck vit D and CBC today   Married to [place]andon, their daughter is 51 yo  Patient Active Problem List   Diagnosis Date Noted  . IBS (irritable bowel syndrome) 01/02/2016  . Atypical chest pain 06/30/2015  . Hyperthyroidism 05/24/2011  . Fatigue 05/24/2011  . Anxiety 05/24/2011  . Decreased libido 05/24/2011  . GERD 04/10/2008  . ABDOMINAL PAIN-EPIGASTRIC 04/10/2008    Past Medical History:  Diagnosis Date  . Allergy   . Arthritis   . GERD (gastroesophageal reflux disease)   . IBS (irritable bowel syndrome)   . Thyroid disease    hyperthyroidism    Past Surgical History:  Procedure Laterality Date  . AUGMENTATION MAMMAPLASTY    . CESAREAN SECTION  06/2003  . KNEE SURGERY  2003  . SHOULDER SURGERY  2010    Social History   Tobacco Use  . Smoking status: Never Smoker  . Smokeless tobacco: Never Used  Substance Use Topics  . Alcohol use: Yes    Comment: socially  . Drug use: No    Family History  Problem Relation Age of Onset  . Alcohol abuse Father   . Heart attack Father   . Sudden death Father   . Diabetes Father   . Arthritis Mother   . Hypothyroidism Mother   . Diabetes Mother   . Hypothyroidism Maternal Grandmother   . Cancer Maternal Grandfather         COLON  . Diabetes Other        GRANDFATHER    No Known Allergies  Medication list has been reviewed and updated.  Current Outpatient Medications on File Prior to Visit  Medication Sig Dispense Refill  . ACZONE 7.5 % GEL APPLY TO THE AFFECTED AREA AS NEEDED  3  . ALPRAZolam (XANAX) 0.5 MG tablet Take 1 tablet by mouth three times a day as needed for anxiety or sleep 60 tablet 5  . fluticasone (FLONASE) 50 MCG/ACT nasal spray SPRAY 2 SPRAYS INTO EACH NOSTRIL EVERY DAY 16 g 5  . HYDROcodone-homatropine (HYCODAN) 5-1.5 MG/5ML syrup Take 5 mLs by mouth every 8 (eight) hours as needed. 100 mL 0  . levocetirizine (XYZAL) 5 MG tablet TAKE 1 TABLET BY MOUTH EVERY DAY IN THE EVENING 90 tablet 3  . levothyroxine (SYNTHROID, LEVOTHROID) 25 MCG tablet 75 mcg.   1  . methocarbamol (ROBAXIN) 500 MG tablet Take 1 tablet (500 mg total) by mouth every 8 (eight) hours as needed for muscle spasms. 30 tablet 1  . omeprazole (PRILOSEC) 20 MG capsule TAKE 1 CAPSULE (20 MG TOTAL) BY MOUTH DAILY. 90 capsule 3  . valACYclovir (VALTREX) 500 MG tablet TAKE 4 TABLETS (2GM) ONCE, REPEAT  IN 12 HOURS AS NEEDED FOR COLD SORE 30 tablet 3   No current facility-administered medications on file prior to visit.     Review of Systems:  As per HPI- otherwise negative.   Physical Examination: There were no vitals filed for this visit. There were no vitals filed for this visit. There is no height or weight on file to calculate BMI. Ideal Body Weight:    GEN: WDWN, NAD, Non-toxic, A & O x 3 HEENT: Atraumatic, Normocephalic. Neck supple. No masses, No LAD. Ears and Nose: No external deformity. CV: RRR, No M/G/R. No JVD. No thrill. No extra heart sounds. PULM: CTA B, no wheezes, crackles, rhonchi. No retractions. No resp. distress. No accessory muscle use. ABD: S, NT, ND, +BS. No rebound. No HSM. EXTR: No c/c/e NEURO Normal gait.  PSYCH: Normally interactive. Conversant. Not depressed or anxious appearing.   Calm demeanor.    Assessment and Plan: ***  Signed Lamar Blinks, MD

## 2019-05-17 DIAGNOSIS — M7711 Lateral epicondylitis, right elbow: Secondary | ICD-10-CM | POA: Diagnosis not present

## 2019-05-21 ENCOUNTER — Encounter: Payer: BLUE CROSS/BLUE SHIELD | Admitting: Family Medicine

## 2019-05-28 ENCOUNTER — Ambulatory Visit (HOSPITAL_BASED_OUTPATIENT_CLINIC_OR_DEPARTMENT_OTHER): Payer: BLUE CROSS/BLUE SHIELD

## 2019-06-06 ENCOUNTER — Ambulatory Visit (HOSPITAL_BASED_OUTPATIENT_CLINIC_OR_DEPARTMENT_OTHER)
Admission: RE | Admit: 2019-06-06 | Discharge: 2019-06-06 | Disposition: A | Discharge status: Home / Self Care | Payer: BC Managed Care – PPO | Source: Ambulatory Visit | Attending: Family Medicine | Admitting: Family Medicine

## 2019-06-06 ENCOUNTER — Other Ambulatory Visit (HOSPITAL_BASED_OUTPATIENT_CLINIC_OR_DEPARTMENT_OTHER): Payer: Self-pay | Admitting: Family Medicine

## 2019-06-06 ENCOUNTER — Other Ambulatory Visit: Payer: Self-pay

## 2019-06-06 DIAGNOSIS — Z1231 Encounter for screening mammogram for malignant neoplasm of breast: Secondary | ICD-10-CM

## 2019-06-07 ENCOUNTER — Ambulatory Visit (AMBULATORY_SURGERY_CENTER): Payer: Self-pay

## 2019-06-07 ENCOUNTER — Other Ambulatory Visit: Payer: Self-pay

## 2019-06-07 VITALS — Temp 97.1°F | Ht 62.0 in | Wt 108.0 lb

## 2019-06-07 DIAGNOSIS — Z1211 Encounter for screening for malignant neoplasm of colon: Secondary | ICD-10-CM

## 2019-06-07 NOTE — Progress Notes (Signed)
Per pt, no allergies to soy or egg products.Pt not taking any weight loss meds or using  O2 at home.  Pt refused emmi video.  Pt denies sedation problems. 

## 2019-06-08 ENCOUNTER — Encounter: Payer: BLUE CROSS/BLUE SHIELD | Admitting: Internal Medicine

## 2019-06-09 NOTE — Progress Notes (Addendum)
[facility] [address] [place], [place] [address] [phone]  Date:  06/11/2019   Name:  Melanie Taylor   DOB:  [birth year]   MRN:  [id number]  PCP:  Darreld Mclean, MD    Chief Complaint: Annual Exam (flu shot)   History of Present Illness:  Melanie Taylor is a 53 y.o. very pleasant female patient who presents with the following:  Here today for a complete physical History of hypothyroidism, IBS, GERD, anxiety  Xanax as needed Flonase Xyzal Levothyroxine Cytomel Prilosec  We recently referred her to Dr. Carlean Purl for routine colonoscopy-this is scheduled for next week I last saw her in May with a bump on her vulva-appears consistent with a benign cyst, about 4 mm in diameter.  We will recheck this for today- however pt reports it went away totally   At that time she was also receiving some alternative hormonal and vitamin supplementation treatments regimen position, she had a critically high vitamin D level We might recheck her vit D   Otherwise labs in May were okay, thyroid within range  Flu shot- give today  Tetanus booster- will boost for her today  HIV screening if otherwise drawn labs Mammo-just completed  Her 83 yo daughter is doing online learning right now which is a challenge due to her ADD  Patient Active Problem List   Diagnosis Date Noted  . IBS (irritable bowel syndrome) 01/02/2016  . Atypical chest pain 06/30/2015  . Hyperthyroidism 05/24/2011  . Fatigue 05/24/2011  . Anxiety 05/24/2011  . Decreased libido 05/24/2011  . GERD 04/10/2008  . ABDOMINAL PAIN-EPIGASTRIC 04/10/2008    Past Medical History:  Diagnosis Date  . Allergy   . Arthritis   . GERD (gastroesophageal reflux disease)   . IBS (irritable bowel syndrome)   . Thyroid disease    hyperthyroidism    Past Surgical History:  Procedure Laterality Date  . AUGMENTATION MAMMAPLASTY  11/2004  . CESAREAN SECTION   06/2003   one time  . COLONOSCOPY     over 10 years ago with Dr Earlean Shawl  . KNEE SURGERY  2003   left  . SHOULDER SURGERY  2010   right shoulder    Social History   Tobacco Use  . Smoking status: Never Smoker  . Smokeless tobacco: Never Used  Substance Use Topics  . Alcohol use: Yes    Comment: socially  . Drug use: No    Family History  Problem Relation Age of Onset  . Alcohol abuse Father   . Heart attack Father   . Sudden death Father   . Diabetes Father   . Heart disease Father   . Arthritis Mother   . Hypothyroidism Mother   . Hypothyroidism Maternal Grandmother   . Cancer Maternal Grandfather        COLON  . Colon cancer Maternal Grandfather   . Diabetes Other        GRANDFATHER    No Known Allergies  Medication list has been reviewed and updated.  Current Outpatient Medications on File Prior to Visit  Medication Sig Dispense Refill  . ACZONE 7.5 % GEL APPLY TO THE AFFECTED AREA AS NEEDED  3  . ALPRAZolam (XANAX) 0.5 MG tablet Take 1 tablet by mouth three times a day as needed for anxiety or sleep 60 tablet 5  . Ascorbic Acid (VITAMIN C ADULT GUMMIES PO) Take by mouth. Chew 3 daily    .  b complex vitamins tablet Take 1 tablet by mouth daily.    . bisacodyl (DULCOLAX) 5 MG EC tablet Take 5 mg by mouth. Dulcolax 5 mg bowel prep #4-Take as directed    . fluticasone (FLONASE) 50 MCG/ACT nasal spray SPRAY 2 SPRAYS INTO EACH NOSTRIL EVERY DAY (Patient taking differently: as needed. ) 16 g 5  . levocetirizine (XYZAL) 5 MG tablet TAKE 1 TABLET BY MOUTH EVERY DAY IN THE EVENING 90 tablet 3  . levothyroxine (SYNTHROID, LEVOTHROID) 25 MCG tablet 75 mcg daily before breakfast.   1  . liothyronine (CYTOMEL) 5 MCG tablet Take 5 mcg by mouth daily.    Marland Kitchen. omeprazole (PRILOSEC) 20 MG capsule TAKE 1 CAPSULE (20 MG TOTAL) BY MOUTH DAILY. 90 capsule 3  . Polyethylene Glycol 3350 (MIRALAX PO) Take by mouth. Miralax 238 gm bowel prep-Take as directed    . valACYclovir (VALTREX)  500 MG tablet TAKE 4 TABLETS (2GM) ONCE, REPEAT IN 12 HOURS AS NEEDED FOR COLD SORE (Patient not taking: Reported on 06/07/2019) 30 tablet 3   No current facility-administered medications on file prior to visit.     Review of Systems:  As per HPI- otherwise negative. No CP or SOB She is exercising through [employer]- either outdoors on online   Physical Examination: Vitals:   06/11/19 0943  BP: 110/70  Pulse: 73  Resp: 16  Temp: (!) 97.5 F (36.4 C)  SpO2: 98%   Vitals:   06/11/19 0943  Weight: 115 lb (52.2 kg)   Body mass index is 21.03 kg/m. Ideal Body Weight:    GEN: WDWN, NAD, Non-toxic, A & O x 3, petite build, normal weight,  Looks well  HEENT: Atraumatic, Normocephalic. Neck supple. No masses, No LAD.  TM wnl bilaterally  Ears and Nose: No external deformity. CV: RRR, No M/G/R. No JVD. No thrill. No extra heart sounds. PULM: CTA B, no wheezes, crackles, rhonchi. No retractions. No resp. distress. No accessory muscle use. ABD: S, NT, ND, +BS. No rebound. No HSM. EXTR: No c/c/e NEURO Normal gait.  PSYCH: Normally interactive. Conversant. Not depressed or anxious appearing.  Calm demeanor.    Assessment and Plan: Physical exam  Vitamin D overdose, accidental or unintentional, initial encounter - Plan: Vitamin D (25 hydroxy)  Cyst, vulva  Pre-diabetes  Hyperthyroidism - Plan: TSH  Needs flu shot - Plan: Flu Vaccine QUAD 6+ mos PF IM (Fluarix Quad PF)  Screening for HIV (human immunodeficiency virus) - Plan: HIV Antibody (routine testing w rflx)  Immunization due - Plan: Tdap vaccine greater than or equal to 7yo IM  Here today for a CPE Doing well overall  Recheck vit D Flu and tdap today shingrix another time  Will plan further follow- up pending labs.   Signed Abbe AmsterdamJessica Copland, MD  Received her labs as follows, message to patient  Results for orders placed or performed in visit on 06/11/19  TSH  Result Value Ref Range   TSH 1.23 0.35 - 4.50  uIU/mL  Vitamin D (25 hydroxy)  Result Value Ref Range   VITD 65.78 30.00 - 100.00 ng/mL

## 2019-06-09 NOTE — Patient Instructions (Addendum)
It was nice to see you again today!   I will be in touch with your labs asap You got your tetanus and flu shots today We can do the shingles vaccine series at your convenience  Take care!       Health Maintenance, Female Adopting a healthy lifestyle and getting preventive care are important in promoting health and wellness. Ask your health care provider about:  The right schedule for you to have regular tests and exams.  Things you can do on your own to prevent diseases and keep yourself healthy. What should I know about diet, weight, and exercise? Eat a healthy diet   Eat a diet that includes plenty of vegetables, fruits, low-fat dairy products, and lean protein.  Do not eat a lot of foods that are high in solid fats, added sugars, or sodium. Maintain a healthy weight Body mass index (BMI) is used to identify weight problems. It estimates body fat based on height and weight. Your health care provider can help determine your BMI and help you achieve or maintain a healthy weight. Get regular exercise Get regular exercise. This is one of the most important things you can do for your health. Most adults should:  Exercise for at least 150 minutes each week. The exercise should increase your heart rate and make you sweat (moderate-intensity exercise).  Do strengthening exercises at least twice a week. This is in addition to the moderate-intensity exercise.  Spend less time sitting. Even light physical activity can be beneficial. Watch cholesterol and blood lipids Have your blood tested for lipids and cholesterol at 51 years of age, then have this test every 5 years. Have your cholesterol levels checked more often if:  Your lipid or cholesterol levels are high.  You are older than 51 years of age.  You are at high risk for heart disease. What should I know about cancer screening? Depending on your health history and family history, you may need to have cancer screening at  various ages. This may include screening for:  Breast cancer.  Cervical cancer.  Colorectal cancer.  Skin cancer.  Lung cancer. What should I know about heart disease, diabetes, and high blood pressure? Blood pressure and heart disease  High blood pressure causes heart disease and increases the risk of stroke. This is more likely to develop in people who have high blood pressure readings, are of African descent, or are overweight.  Have your blood pressure checked: ? Every 3-5 years if you are 918-51 years of age. ? Every year if you are 51 years old or older. Diabetes Have regular diabetes screenings. This checks your fasting blood sugar level. Have the screening done:  Once every three years after age 51 if you are at a normal weight and have a low risk for diabetes.  More often and at a younger age if you are overweight or have a high risk for diabetes. What should I know about preventing infection? Hepatitis B If you have a higher risk for hepatitis B, you should be screened for this virus. Talk with your health care provider to find out if you are at risk for hepatitis B infection. Hepatitis C Testing is recommended for:  Everyone born from [birth year] through 1965.  Anyone with known risk factors for hepatitis C. Sexually transmitted infections (STIs)  Get screened for STIs, including gonorrhea and chlamydia, if: ? You are sexually active and are younger than 51 years of age. ? You are older than 51 years  of age and your health care provider tells you that you are at risk for this type of infection. ? Your sexual activity has changed since you were last screened, and you are at increased risk for chlamydia or gonorrhea. Ask your health care provider if you are at risk.  Ask your health care provider about whether you are at high risk for HIV. Your health care provider may recommend a prescription medicine to help prevent HIV infection. If you choose to take medicine to prevent  HIV, you should first get tested for HIV. You should then be tested every 3 months for as long as you are taking the medicine. Pregnancy  If you are about to stop having your period (premenopausal) and you may become pregnant, seek counseling before you get pregnant.  Take 400 to 800 micrograms (mcg) of folic acid every day if you become pregnant.  Ask for birth control (contraception) if you want to prevent pregnancy. Osteoporosis and menopause Osteoporosis is a disease in which the bones lose minerals and strength with aging. This can result in bone fractures. If you are 63 years old or older, or if you are at risk for osteoporosis and fractures, ask your health care provider if you should:  Be screened for bone loss.  Take a calcium or vitamin D supplement to lower your risk of fractures.  Be given hormone replacement therapy (HRT) to treat symptoms of menopause. Follow these instructions at home: Lifestyle  Do not use any products that contain nicotine or tobacco, such as cigarettes, e-cigarettes, and chewing tobacco. If you need help quitting, ask your health care provider.  Do not use street drugs.  Do not share needles.  Ask your health care provider for help if you need support or information about quitting drugs. Alcohol use  Do not drink alcohol if: ? Your health care provider tells you not to drink. ? You are pregnant, may be pregnant, or are planning to become pregnant.  If you drink alcohol: ? Limit how much you use to 0-1 drink a day. ? Limit intake if you are breastfeeding.  Be aware of how much alcohol is in your drink. In the [place] one drink equals one 12 oz bottle of beer (355 mL), one 5 oz glass of wine (148 mL), or one 1 oz glass of hard liquor (44 mL). General instructions  Schedule regular health, dental, and eye exams.  Stay current with your vaccines.  Tell your health care provider if: ? You often feel depressed. ? You have ever been abused or do  not feel safe at home. Summary  Adopting a healthy lifestyle and getting preventive care are important in promoting health and wellness.  Follow your health care provider's instructions about healthy diet, exercising, and getting tested or screened for diseases.  Follow your health care provider's instructions on monitoring your cholesterol and blood pressure. This information is not intended to replace advice given to you by your health care provider. Make sure you discuss any questions you have with your health care provider. Document Released: 04/12/2011 Document Revised: 09/20/2018 Document Reviewed: 09/20/2018 Elsevier Patient Education  2020 [employer].

## 2019-06-11 ENCOUNTER — Encounter: Payer: Self-pay | Admitting: Family Medicine

## 2019-06-11 ENCOUNTER — Other Ambulatory Visit: Payer: Self-pay

## 2019-06-11 ENCOUNTER — Encounter: Payer: Self-pay | Admitting: Internal Medicine

## 2019-06-11 ENCOUNTER — Ambulatory Visit (INDEPENDENT_AMBULATORY_CARE_PROVIDER_SITE_OTHER): Payer: BC Managed Care – PPO | Admitting: Family Medicine

## 2019-06-11 VITALS — BP 110/70 | HR 73 | Temp 97.5°F | Resp 16 | Wt 115.0 lb

## 2019-06-11 DIAGNOSIS — N907 Vulvar cyst: Secondary | ICD-10-CM

## 2019-06-11 DIAGNOSIS — Z114 Encounter for screening for human immunodeficiency virus [HIV]: Secondary | ICD-10-CM

## 2019-06-11 DIAGNOSIS — Z0000 Encounter for general adult medical examination without abnormal findings: Secondary | ICD-10-CM

## 2019-06-11 DIAGNOSIS — Z23 Encounter for immunization: Secondary | ICD-10-CM | POA: Diagnosis not present

## 2019-06-11 DIAGNOSIS — E0590 Thyrotoxicosis, unspecified without thyrotoxic crisis or storm: Secondary | ICD-10-CM | POA: Diagnosis not present

## 2019-06-11 DIAGNOSIS — T452X1A Poisoning by vitamins, accidental (unintentional), initial encounter: Secondary | ICD-10-CM

## 2019-06-11 DIAGNOSIS — R7303 Prediabetes: Secondary | ICD-10-CM

## 2019-06-11 LAB — VITAMIN D 25 HYDROXY (VIT D DEFICIENCY, FRACTURES): VITD: 65.78 ng/mL (ref 30.00–100.00)

## 2019-06-11 LAB — TSH: TSH: 1.23 u[IU]/mL (ref 0.35–4.50)

## 2019-06-12 LAB — HIV ANTIBODY (ROUTINE TESTING W REFLEX): HIV 1&2 Ab, 4th Generation: NONREACTIVE

## 2019-06-20 ENCOUNTER — Telehealth: Payer: Self-pay

## 2019-06-20 NOTE — Telephone Encounter (Signed)
Covid-19 screening questions   Do you now or have you had a fever in the last 14 days?  Do you have any respiratory symptoms of shortness of breath or cough now or in the last 14 days?  Do you have any family members or close contacts with diagnosed or suspected Covid-19 in the past 14 days?  Have you been tested for Covid-19 and found to be positive?       

## 2019-06-20 NOTE — Telephone Encounter (Signed)
Pt responded "no" to all screening questions °

## 2019-06-21 ENCOUNTER — Ambulatory Visit (AMBULATORY_SURGERY_CENTER): Payer: BC Managed Care – PPO | Admitting: Internal Medicine

## 2019-06-21 ENCOUNTER — Other Ambulatory Visit: Payer: Self-pay

## 2019-06-21 ENCOUNTER — Encounter: Payer: Self-pay | Admitting: Internal Medicine

## 2019-06-21 VITALS — BP 104/75 | HR 64 | Temp 98.4°F | Resp 20 | Ht 62.0 in | Wt 108.0 lb

## 2019-06-21 DIAGNOSIS — Z1211 Encounter for screening for malignant neoplasm of colon: Secondary | ICD-10-CM

## 2019-06-21 MED ORDER — SODIUM CHLORIDE 0.9 % IV SOLN: 500.0000 mL | Freq: Once | INTRAVENOUS | Status: DC

## 2019-06-21 NOTE — Op Note (Signed)
[facility] Patient Name: Melanie Taylor Procedure Date: 06/21/2019 8:26 AM MRN: [id number] Endoscopist: Gatha Mayer , MD Age: 53 Referring MD:  Date of Birth: [birth year] Gender: Female Account #: [id number] Procedure:                Colonoscopy Indications:              Screening for colorectal malignant neoplasm Medicines:                Propofol per Anesthesia, Monitored Anesthesia Care Procedure:                Pre-Anesthesia Assessment:                           - Prior to the procedure, a History and Physical                            was performed, and patient medications and                            allergies were reviewed. The patient's tolerance of                            previous anesthesia was also reviewed. The risks                            and benefits of the procedure and the sedation                            options and risks were discussed with the patient.                            All questions were answered, and informed consent                            was obtained. Prior Anticoagulants: The patient has                            taken no previous anticoagulant or antiplatelet                            agents. ASA Grade Assessment: II - A patient with                            mild systemic disease. After reviewing the risks                            and benefits, the patient was deemed in                            satisfactory condition to undergo the procedure.                           After obtaining informed consent, the colonoscope  was passed under direct vision. Throughout the                            procedure, the patient's blood pressure, pulse, and                            oxygen saturations were monitored continuously. The                            Colonoscope was introduced through the anus and                            advanced to the the cecum, identified by   appendiceal orifice and ileocecal valve. The                            quality of the bowel preparation was excellent. The                            colonoscopy was performed without difficulty. The                            patient tolerated the procedure well. The bowel                            preparation used was Miralax via split dose                            instruction. Scope In: 8:38:37 AM Scope Out: 8:51:49 AM Scope Withdrawal Time: 0 hours 8 minutes 28 seconds  Total Procedure Duration: 0 hours 13 minutes 12 seconds  Findings:                 The perianal and digital rectal examinations were                            normal.                           The colon (entire examined portion) appeared normal.                           No additional abnormalities were found on                            retroflexion. Complications:            No immediate complications. Estimated blood loss:                            None. Estimated Blood Loss:     Estimated blood loss: none. Impression:               - The entire examined colon is normal.                           - No specimens collected. Recommendation:           -  Repeat colonoscopy or other appropriate test in                            10 years for screening purposes.                           - Patient has a contact number available for                            emergencies. The signs and symptoms of potential                            delayed complications were discussed with the                            patient. Return to normal activities tomorrow.                            Written discharge instructions were provided to the                            patient.                           - Resume previous diet.                           - Continue present medications. Iva Boop, MD 06/21/2019 8:58:02 AM This report has been signed electronically.

## 2019-06-21 NOTE — Progress Notes (Signed)
A and O x3. Report to RN. Tolerated MAC anesthesia well.

## 2019-06-21 NOTE — Patient Instructions (Addendum)
I am please to say that the colonoscopy was normal!  No polyps or cancer seen.  Next routine colonoscopy or other screening test in 10 years - 2030  I appreciate the opportunity to care for you. Gatha Mayer, MD, FACG YOU HAD AN ENDOSCOPIC PROCEDURE TODAY AT [facility]:   Refer to the procedure report that was given to you for any specific questions about what was found during the examination.  If the procedure report does not answer your questions, please call your gastroenterologist to clarify.  If you requested that your care partner not be given the details of your procedure findings, then the procedure report has been included in a sealed envelope for you to review at your convenience later.  YOU SHOULD EXPECT: Some feelings of bloating in the abdomen. Passage of more gas than usual.  Walking can help get rid of the air that was put into your GI tract during the procedure and reduce the bloating. If you had a lower endoscopy (such as a colonoscopy or flexible sigmoidoscopy) you may notice spotting of blood in your stool or on the toilet paper. If you underwent a bowel prep for your procedure, you may not have a normal bowel movement for a few days.  Please Note:  You might notice some irritation and congestion in your nose or some drainage.  This is from the oxygen used during your procedure.  There is no need for concern and it should clear up in a day or so.  SYMPTOMS TO REPORT IMMEDIATELY:   Following lower endoscopy (colonoscopy or flexible sigmoidoscopy):  Excessive amounts of blood in the stool  Significant tenderness or worsening of abdominal pains  Swelling of the abdomen that is new, acute  Fever of 100F or higher  For urgent or emergent issues, a gastroenterologist can be reached at any hour by calling [phone].   DIET:  We do recommend a small meal at first, but then you may proceed to your regular diet.  Drink plenty of fluids but you should  avoid alcoholic beverages for 24 hours.  MEDICATIONS: Continue present medications.  Please see handouts given to you by your recovery nurse.  ACTIVITY:  You should plan to take it easy for the rest of today and you should NOT DRIVE or use heavy machinery until tomorrow (because of the sedation medicines used during the test).    FOLLOW UP: Our staff will call the number listed on your records 48-72 hours following your procedure to check on you and address any questions or concerns that you may have regarding the information given to you following your procedure. If we do not reach you, we will leave a message.  We will attempt to reach you two times.  During this call, we will ask if you have developed any symptoms of COVID 19. If you develop any symptoms (ie: fever, flu-like symptoms, shortness of breath, cough etc.) before then, please call [phone].  If you test positive for Covid 19 in the 2 weeks post procedure, please call and report this information to [place].    If any biopsies were taken you will be contacted by phone or by letter within the next 1-3 weeks.  Please call us at [phone] if you have not heard about the biopsies in 3 weeks.   Thank you for allowing [place] to provide for your healthcare needs today.   SIGNATURES/CONFIDENTIALITY: You and/or your care partner have signed paperwork which will be entered  into your electronic medical record.  These signatures attest to the fact that that the information above on your After Visit Summary has been reviewed and is understood.  Full responsibility of the confidentiality of this discharge information lies with you and/or your care-partner.

## 2019-06-21 NOTE — Progress Notes (Signed)
Temperature- June Bullock VS- Courtney Washington  Pt's states no medical or surgical changes since previsit or office visit.  

## 2019-06-25 ENCOUNTER — Telehealth: Payer: Self-pay

## 2019-06-25 NOTE — Telephone Encounter (Signed)
  Follow up Call-  Call back number 06/21/2019  Post procedure Call Back phone  # [phone]  Permission to leave phone message Yes  Some recent data might be hidden     Patient questions:  Do you have a fever, pain , or abdominal swelling? No. Pain Score  0 *  Have you tolerated food without any problems? Yes.    Have you been able to return to your normal activities? Yes.    Do you have any questions about your discharge instructions: Diet   No. Medications  No. Follow up visit  No.  Do you have questions or concerns about your Care? No.  Actions: * If pain score is 4 or above: 1. No action needed, pain <4.Have you developed a fever since your procedure? no  2.   Have you had an respiratory symptoms (SOB or cough) since your procedure? no  3.   Have you tested positive for COVID 19 since your procedure no  4.   Have you had any family members/close contacts diagnosed with the COVID 19 since your procedure?  no   If yes to any of these questions please route to Joylene John, RN and Alphonsa Gin, Therapist, sports.

## 2019-06-25 NOTE — Telephone Encounter (Signed)
Left message on follow up call. 

## 2019-07-18 DIAGNOSIS — E2749 Other adrenocortical insufficiency: Secondary | ICD-10-CM | POA: Diagnosis not present

## 2019-07-18 DIAGNOSIS — E038 Other specified hypothyroidism: Secondary | ICD-10-CM | POA: Diagnosis not present

## 2019-07-18 DIAGNOSIS — N951 Menopausal and female climacteric states: Secondary | ICD-10-CM | POA: Diagnosis not present

## 2019-08-06 ENCOUNTER — Other Ambulatory Visit: Payer: Self-pay | Admitting: Family Medicine

## 2019-08-06 DIAGNOSIS — F419 Anxiety disorder, unspecified: Secondary | ICD-10-CM

## 2019-08-22 DIAGNOSIS — M7711 Lateral epicondylitis, right elbow: Secondary | ICD-10-CM | POA: Diagnosis not present

## 2019-08-22 DIAGNOSIS — M7712 Lateral epicondylitis, left elbow: Secondary | ICD-10-CM | POA: Diagnosis not present

## 2019-09-04 DIAGNOSIS — M25521 Pain in right elbow: Secondary | ICD-10-CM | POA: Diagnosis not present

## 2019-09-14 DIAGNOSIS — M25521 Pain in right elbow: Secondary | ICD-10-CM | POA: Diagnosis not present

## 2019-09-19 DIAGNOSIS — M25521 Pain in right elbow: Secondary | ICD-10-CM | POA: Diagnosis not present

## 2019-09-26 DIAGNOSIS — M25521 Pain in right elbow: Secondary | ICD-10-CM | POA: Diagnosis not present

## 2019-10-10 DIAGNOSIS — M25521 Pain in right elbow: Secondary | ICD-10-CM | POA: Diagnosis not present

## 2019-10-12 HISTORY — PX: SHOULDER SURGERY: SHX246

## 2019-10-17 DIAGNOSIS — M7542 Impingement syndrome of left shoulder: Secondary | ICD-10-CM | POA: Diagnosis not present

## 2019-10-17 DIAGNOSIS — M7552 Bursitis of left shoulder: Secondary | ICD-10-CM | POA: Diagnosis not present

## 2019-10-17 DIAGNOSIS — M25512 Pain in left shoulder: Secondary | ICD-10-CM | POA: Diagnosis not present

## 2019-11-05 DIAGNOSIS — Z03818 Encounter for observation for suspected exposure to other biological agents ruled out: Secondary | ICD-10-CM | POA: Diagnosis not present

## 2019-12-03 DIAGNOSIS — M7551 Bursitis of right shoulder: Secondary | ICD-10-CM | POA: Diagnosis not present

## 2019-12-03 DIAGNOSIS — M7541 Impingement syndrome of right shoulder: Secondary | ICD-10-CM | POA: Diagnosis not present

## 2019-12-03 DIAGNOSIS — M25512 Pain in left shoulder: Secondary | ICD-10-CM | POA: Diagnosis not present

## 2019-12-06 DIAGNOSIS — H04123 Dry eye syndrome of bilateral lacrimal glands: Secondary | ICD-10-CM | POA: Diagnosis not present

## 2019-12-06 DIAGNOSIS — H5203 Hypermetropia, bilateral: Secondary | ICD-10-CM | POA: Diagnosis not present

## 2019-12-10 DIAGNOSIS — M7542 Impingement syndrome of left shoulder: Secondary | ICD-10-CM | POA: Diagnosis not present

## 2019-12-10 DIAGNOSIS — M25512 Pain in left shoulder: Secondary | ICD-10-CM | POA: Diagnosis not present

## 2019-12-13 DIAGNOSIS — M25512 Pain in left shoulder: Secondary | ICD-10-CM | POA: Diagnosis not present

## 2019-12-13 DIAGNOSIS — M7542 Impingement syndrome of left shoulder: Secondary | ICD-10-CM | POA: Diagnosis not present

## 2019-12-20 DIAGNOSIS — M7711 Lateral epicondylitis, right elbow: Secondary | ICD-10-CM | POA: Diagnosis not present

## 2019-12-23 ENCOUNTER — Other Ambulatory Visit: Payer: Self-pay | Admitting: Family Medicine

## 2019-12-26 ENCOUNTER — Telehealth: Payer: Self-pay

## 2019-12-26 NOTE — Telephone Encounter (Signed)
Heather from [facility] is calling in requesting back dated order for 11/14/2019 for this patient please give Herbert Seta a call back at [phone] as soon as possible thanks,

## 2019-12-27 DIAGNOSIS — M7542 Impingement syndrome of left shoulder: Secondary | ICD-10-CM | POA: Diagnosis not present

## 2019-12-27 DIAGNOSIS — M25512 Pain in left shoulder: Secondary | ICD-10-CM | POA: Diagnosis not present

## 2019-12-27 NOTE — Telephone Encounter (Signed)
Spoke with referrals/pre cert coordinater. She states her insurance does not require referrals. The pre cert should have been on the eye specialist not the pcp.

## 2020-01-08 DIAGNOSIS — M7542 Impingement syndrome of left shoulder: Secondary | ICD-10-CM | POA: Diagnosis not present

## 2020-01-08 DIAGNOSIS — M25512 Pain in left shoulder: Secondary | ICD-10-CM | POA: Diagnosis not present

## 2020-01-22 DIAGNOSIS — M7542 Impingement syndrome of left shoulder: Secondary | ICD-10-CM | POA: Diagnosis not present

## 2020-01-22 DIAGNOSIS — M25512 Pain in left shoulder: Secondary | ICD-10-CM | POA: Diagnosis not present

## 2020-01-28 DIAGNOSIS — M7542 Impingement syndrome of left shoulder: Secondary | ICD-10-CM | POA: Diagnosis not present

## 2020-01-28 DIAGNOSIS — M25512 Pain in left shoulder: Secondary | ICD-10-CM | POA: Diagnosis not present

## 2020-01-31 DIAGNOSIS — M25512 Pain in left shoulder: Secondary | ICD-10-CM | POA: Diagnosis not present

## 2020-01-31 DIAGNOSIS — M7542 Impingement syndrome of left shoulder: Secondary | ICD-10-CM | POA: Diagnosis not present

## 2020-02-03 ENCOUNTER — Encounter: Payer: Self-pay | Admitting: Family Medicine

## 2020-02-03 DIAGNOSIS — F520 Hypoactive sexual desire disorder: Secondary | ICD-10-CM

## 2020-02-04 ENCOUNTER — Telehealth: Payer: Self-pay

## 2020-02-04 DIAGNOSIS — M7542 Impingement syndrome of left shoulder: Secondary | ICD-10-CM | POA: Diagnosis not present

## 2020-02-04 DIAGNOSIS — M25512 Pain in left shoulder: Secondary | ICD-10-CM | POA: Diagnosis not present

## 2020-02-04 MED ORDER — ADDYI 100 MG PO TABS: 100.0000 mg | ORAL_TABLET | Freq: Every day | ORAL | 6 refills | Status: DC

## 2020-02-04 NOTE — Telephone Encounter (Signed)
PA initiated via Covermymeds; KEY: [id number]. Awaiting determination.

## 2020-02-04 NOTE — Addendum Note (Signed)
Addended by: Abbe Amsterdam C on: 02/04/2020 12:14 PM   Modules accepted: Orders

## 2020-02-05 NOTE — Telephone Encounter (Signed)
PA approved. Effective from 02/04/2020 through 05/03/2020.

## 2020-02-10 ENCOUNTER — Encounter: Payer: Self-pay | Admitting: Family Medicine

## 2020-02-10 DIAGNOSIS — F520 Hypoactive sexual desire disorder: Secondary | ICD-10-CM

## 2020-02-22 ENCOUNTER — Other Ambulatory Visit: Payer: Self-pay | Admitting: Family Medicine

## 2020-02-22 ENCOUNTER — Encounter: Payer: Self-pay | Admitting: Family Medicine

## 2020-02-22 DIAGNOSIS — B001 Herpesviral vesicular dermatitis: Secondary | ICD-10-CM

## 2020-02-22 DIAGNOSIS — F419 Anxiety disorder, unspecified: Secondary | ICD-10-CM

## 2020-03-07 ENCOUNTER — Encounter: Payer: Self-pay | Admitting: Family Medicine

## 2020-03-16 NOTE — Progress Notes (Deleted)
[place] at [facility] [address] [place], [place] [address] [phone]  Date:  03/17/2020   Name:  BRIGGETT TUCCILLO   DOB:  09/12/1966   MRN:  [id number]  PCP:  Darreld Mclean, MD    Chief Complaint: No chief complaint on file.   History of Present Illness:  ODESSER TOURANGEAU is a 54 y.o. very pleasant female patient who presents with the following:  Generally healthy woman with history of GERD and IBS, hypothyroidism Here today with concern of hot flashes Last seen by myself in August 2020 for physical  She is married, her daughter 45 I recently started her on Addyi for hypoactive sexual desire  Covid series  Patient Active Problem List   Diagnosis Date Noted  . IBS (irritable bowel syndrome) 01/02/2016  . Atypical chest pain 06/30/2015  . Hyperthyroidism 05/24/2011  . Fatigue 05/24/2011  . Anxiety 05/24/2011  . Decreased libido 05/24/2011  . GERD 04/10/2008  . ABDOMINAL PAIN-EPIGASTRIC 04/10/2008    Past Medical History:  Diagnosis Date  . Allergy   . Arthritis   . GERD (gastroesophageal reflux disease)   . IBS (irritable bowel syndrome)   . Thyroid disease    hyperthyroidism    Past Surgical History:  Procedure Laterality Date  . AUGMENTATION MAMMAPLASTY  11/2004  . CESAREAN SECTION  06/2003   one time  . COLONOSCOPY     over 10 years ago with Dr Earlean Shawl  . KNEE SURGERY  2003   left  . SHOULDER SURGERY  2010   right shoulder    Social History   Tobacco Use  . Smoking status: Never Smoker  . Smokeless tobacco: Never Used  Substance Use Topics  . Alcohol use: Yes    Comment: socially  . Drug use: No    Family History  Problem Relation Age of Onset  . Alcohol abuse Father   . Heart attack Father   . Sudden death Father   . Diabetes Father   . Heart disease Father   . Arthritis Mother   . Hypothyroidism Mother   . Hypothyroidism Maternal Grandmother   . Cancer Maternal Grandfather         COLON  . Colon cancer Maternal Grandfather   . Diabetes Other        GRANDFATHER  . Esophageal cancer Neg Hx   . Rectal cancer Neg Hx   . Stomach cancer Neg Hx     No Known Allergies  Medication list has been reviewed and updated.  Current Outpatient Medications on File Prior to Visit  Medication Sig Dispense Refill  . ALPRAZolam (XANAX) 0.5 MG tablet TAKE 1 TABLET BY MOUTH THREE TIMES A DAY AS NEEDED FOR ANXIETY OR SLEEP 60 tablet 0  . Ascorbic Acid (VITAMIN C ADULT GUMMIES PO) Take by mouth. Chew 3 daily    . b complex vitamins tablet Take 1 tablet by mouth daily.    . Flibanserin (ADDYI) 100 MG TABS Take 100 mg by mouth at bedtime. 30 tablet 6  . fluticasone (FLONASE) 50 MCG/ACT nasal spray SPRAY 2 SPRAYS INTO EACH NOSTRIL EVERY DAY (Patient taking differently: as needed. ) 16 g 5  . levocetirizine (XYZAL) 5 MG tablet TAKE 1 TABLET BY MOUTH EVERY DAY IN THE EVENING 90 tablet 3  . levothyroxine (SYNTHROID, LEVOTHROID) 25 MCG tablet 75 mcg daily before breakfast.   1  . liothyronine (CYTOMEL) 5 MCG tablet Take 5 mcg by  mouth daily.    Marland Kitchen omeprazole (PRILOSEC) 20 MG capsule TAKE 1 CAPSULE BY MOUTH EVERY DAY 90 capsule 3  . valACYclovir (VALTREX) 500 MG tablet TAKE 4 TABLETS (2GM) ONCE, REPEAT IN 12 HOURS AS NEEDED FOR COLD SORE 30 tablet 3   Current Facility-Administered Medications on File Prior to Visit  Medication Dose Route Frequency Provider Last Rate Last Admin  . 0.9 %  sodium chloride infusion  500 mL Intravenous Once Iva Boop, MD        Review of Systems:  As per HPI- otherwise negative.   Physical Examination: There were no vitals filed for this visit. There were no vitals filed for this visit. There is no height or weight on file to calculate BMI. Ideal Body Weight:    GEN: no acute distress. HEENT: Atraumatic, Normocephalic.  Ears and Nose: No external deformity. CV: RRR, No M/G/R. No JVD. No thrill. No extra heart sounds. PULM: CTA B, no wheezes,  crackles, rhonchi. No retractions. No resp. distress. No accessory muscle use. ABD: S, NT, ND, +BS. No rebound. No HSM. EXTR: No c/c/e PSYCH: Normally interactive. Conversant.    Assessment and Plan: ***  Signed Abbe Amsterdam, MD

## 2020-03-17 ENCOUNTER — Ambulatory Visit: Payer: BC Managed Care – PPO | Admitting: Family Medicine

## 2020-03-17 ENCOUNTER — Encounter: Payer: Self-pay | Admitting: Family Medicine

## 2020-03-28 ENCOUNTER — Other Ambulatory Visit: Payer: Self-pay | Admitting: Family Medicine

## 2020-03-28 DIAGNOSIS — J301 Allergic rhinitis due to pollen: Secondary | ICD-10-CM

## 2020-04-03 ENCOUNTER — Ambulatory Visit (INDEPENDENT_AMBULATORY_CARE_PROVIDER_SITE_OTHER): Payer: BC Managed Care – PPO | Admitting: Family Medicine

## 2020-04-03 ENCOUNTER — Other Ambulatory Visit: Payer: Self-pay

## 2020-04-03 ENCOUNTER — Encounter: Payer: Self-pay | Admitting: Family Medicine

## 2020-04-03 VITALS — BP 98/66 | HR 83 | Temp 98.1°F | Resp 12 | Ht 61.0 in | Wt 114.6 lb

## 2020-04-03 DIAGNOSIS — Z13220 Encounter for screening for lipoid disorders: Secondary | ICD-10-CM

## 2020-04-03 DIAGNOSIS — K146 Glossodynia: Secondary | ICD-10-CM | POA: Diagnosis not present

## 2020-04-03 DIAGNOSIS — E0590 Thyrotoxicosis, unspecified without thyrotoxic crisis or storm: Secondary | ICD-10-CM

## 2020-04-03 DIAGNOSIS — Z1159 Encounter for screening for other viral diseases: Secondary | ICD-10-CM | POA: Diagnosis not present

## 2020-04-03 DIAGNOSIS — Z130 Encounter for screening for diseases of the blood and blood-forming organs and certain disorders involving the immune mechanism: Secondary | ICD-10-CM | POA: Diagnosis not present

## 2020-04-03 DIAGNOSIS — R7303 Prediabetes: Secondary | ICD-10-CM

## 2020-04-03 DIAGNOSIS — M79622 Pain in left upper arm: Secondary | ICD-10-CM

## 2020-04-03 NOTE — Patient Instructions (Addendum)
It was good to see you again today! I will be in touch with your labs and we will set you up for a diagnostic mammo and left underarm ultrasound asap  - this will be done at [facility] (you can call yourself if you like)

## 2020-04-03 NOTE — Progress Notes (Deleted)
[place] at [facility] [address] [place], [place] [address] [phone]  Date:  04/03/2020   Name:  Melanie Taylor   DOB:  September 15, 1966   MRN:  [id number]  PCP:  Darreld Mclean, MD    Chief Complaint: No chief complaint on file.   History of Present Illness:  Melanie Taylor is a 54 y.o. very pleasant female patient who presents with the following:  Woman with history of IBS, hypothyroidism, fatigue and anxiety Here today with concern of mouth irritation  Last seen by myself  Covid series Pap up-to-date Colonoscopy up-to-date Can suggest Shingrix Due for routine labs, hep C screening Patient Active Problem List   Diagnosis Date Noted  . IBS (irritable bowel syndrome) 01/02/2016  . Atypical chest pain 06/30/2015  . Hyperthyroidism 05/24/2011  . Fatigue 05/24/2011  . Anxiety 05/24/2011  . Decreased libido 05/24/2011  . GERD 04/10/2008  . ABDOMINAL PAIN-EPIGASTRIC 04/10/2008    Past Medical History:  Diagnosis Date  . Allergy   . Arthritis   . GERD (gastroesophageal reflux disease)   . IBS (irritable bowel syndrome)   . Thyroid disease    hyperthyroidism    Past Surgical History:  Procedure Laterality Date  . AUGMENTATION MAMMAPLASTY  11/2004  . CESAREAN SECTION  06/2003   one time  . COLONOSCOPY     over 10 years ago with Dr Earlean Shawl  . KNEE SURGERY  2003   left  . SHOULDER SURGERY  2010   right shoulder    Social History   Tobacco Use  . Smoking status: Never Smoker  . Smokeless tobacco: Never Used  Vaping Use  . Vaping Use: Never used  Substance Use Topics  . Alcohol use: Yes    Comment: socially  . Drug use: No    Family History  Problem Relation Age of Onset  . Alcohol abuse Father   . Heart attack Father   . Sudden death Father   . Diabetes Father   . Heart disease Father   . Arthritis Mother   . Hypothyroidism Mother   . Hypothyroidism Maternal Grandmother   . Cancer Maternal  Grandfather        COLON  . Colon cancer Maternal Grandfather   . Diabetes Other        GRANDFATHER  . Esophageal cancer Neg Hx   . Rectal cancer Neg Hx   . Stomach cancer Neg Hx     No Known Allergies  Medication list has been reviewed and updated.  Current Outpatient Medications on File Prior to Visit  Medication Sig Dispense Refill  . ALPRAZolam (XANAX) 0.5 MG tablet TAKE 1 TABLET BY MOUTH THREE TIMES A DAY AS NEEDED FOR ANXIETY OR SLEEP 60 tablet 0  . Ascorbic Acid (VITAMIN C ADULT GUMMIES PO) Take by mouth. Chew 3 daily    . b complex vitamins tablet Take 1 tablet by mouth daily.    . Flibanserin (ADDYI) 100 MG TABS Take 100 mg by mouth at bedtime. 30 tablet 6  . fluticasone (FLONASE) 50 MCG/ACT nasal spray SPRAY 2 SPRAYS INTO EACH NOSTRIL EVERY DAY (Patient taking differently: as needed. ) 16 g 5  . levocetirizine (XYZAL) 5 MG tablet TAKE 1 TABLET BY MOUTH EVERY DAY IN THE EVENING 90 tablet 0  . levothyroxine (SYNTHROID, LEVOTHROID) 25 MCG tablet 75 mcg daily before breakfast.   1  . liothyronine (CYTOMEL) 5 MCG tablet Take 5 mcg by  mouth daily.    Marland Kitchen omeprazole (PRILOSEC) 20 MG capsule TAKE 1 CAPSULE BY MOUTH EVERY DAY 90 capsule 3  . valACYclovir (VALTREX) 500 MG tablet TAKE 4 TABLETS (2GM) ONCE, REPEAT IN 12 HOURS AS NEEDED FOR COLD SORE 30 tablet 3   Current Facility-Administered Medications on File Prior to Visit  Medication Dose Route Frequency Provider Last Rate Last Admin  . 0.9 %  sodium chloride infusion  500 mL Intravenous Once Iva Boop, MD        Review of Systems:  As per HPI- otherwise negative.   Physical Examination: There were no vitals filed for this visit. There were no vitals filed for this visit. There is no height or weight on file to calculate BMI. Ideal Body Weight:    GEN: no acute distress. HEENT: Atraumatic, Normocephalic.  Ears and Nose: No external deformity. CV: RRR, No M/G/R. No JVD. No thrill. No extra heart sounds. PULM: CTA  B, no wheezes, crackles, rhonchi. No retractions. No resp. distress. No accessory muscle use. ABD: S, NT, ND, +BS. No rebound. No HSM. EXTR: No c/c/e PSYCH: Normally interactive. Conversant.    Assessment and Plan: *** This visit occurred during the SARS-CoV-2 public health emergency.  Safety protocols were in place, including screening questions prior to the visit, additional usage of staff PPE, and extensive cleaning of exam room while observing appropriate contact time as indicated for disinfecting solutions.    Signed Abbe Amsterdam, MD

## 2020-04-03 NOTE — Progress Notes (Addendum)
[facility] at [facility] [address] [place], [place] [address] [phone]  Date:  04/03/2020   Name:  Melanie Taylor   DOB:  [birth year]   MRN:  [id number]  PCP:  Pearline Cables, MD    Chief Complaint: mouth irriatation (tongue) and discomfort in left axilla   History of Present Illness:  Melanie Taylor is a 54 y.o. very pleasant female patient who presents with the following:  Woman with history of IBS, hypothyroidism, fatigue and anxiety Here today with concern of mouth irritation- she has noted a sense of her tongue burning  This has gone on for several months Not itching, just burning.  No change in the appearance of her tongue  The pattern can vary- typically worse if she is talking more, it bothers her so much that sometimes she just does not talk towards the end of the day  She is taking a b complex vitamin   She has also noted discomfort in her left axilla for 3 weeks or so  She is not aware of any injury She noted the pain especially when she was at her exercise class, she was not able to do a plank or other particular movements due to her pain Since that time the pain has become less sharp and more generalized, but still continues Her mammogram is up-to-date, she has not noted any masses or change in her breasts She does not have any chest pain or shortness of breath with exertion Last seen by myself not quite a year ago for a CPE   Covid series- completed in May.  The patient does not recall if she got her most recent vaccine in her left or right arm Pap up-to-date Colonoscopy up-to-date Can suggest Shingrix Due for routine labs, hep C screening mammo UTD   BP Readings from Last 3 Encounters:  04/03/20 98/66  06/21/19 104/75  06/11/19 110/70    Patient Active Problem List   Diagnosis Date Noted  . IBS (irritable bowel syndrome) 01/02/2016  . Atypical chest pain 06/30/2015  . Hyperthyroidism  05/24/2011  . Fatigue 05/24/2011  . Anxiety 05/24/2011  . Decreased libido 05/24/2011  . GERD 04/10/2008  . ABDOMINAL PAIN-EPIGASTRIC 04/10/2008    Past Medical History:  Diagnosis Date  . Allergy   . Arthritis   . GERD (gastroesophageal reflux disease)   . IBS (irritable bowel syndrome)   . Thyroid disease    hyperthyroidism    Past Surgical History:  Procedure Laterality Date  . AUGMENTATION MAMMAPLASTY  11/2004  . CESAREAN SECTION  06/2003   one time  . COLONOSCOPY     over 10 years ago with Dr Kinnie Scales  . KNEE SURGERY  2003   left  . SHOULDER SURGERY  2010   right shoulder    Social History   Tobacco Use  . Smoking status: Never Smoker  . Smokeless tobacco: Never Used  Vaping Use  . Vaping Use: Never used  Substance Use Topics  . Alcohol use: Yes    Comment: socially  . Drug use: No    Family History  Problem Relation Age of Onset  . Alcohol abuse Father   . Heart attack Father   . Sudden death Father   . Diabetes Father   . Heart disease Father   . Arthritis Mother   . Hypothyroidism Mother   . Hypothyroidism Maternal Grandmother   . Cancer Maternal Grandfather  COLON  . Colon cancer Maternal Grandfather   . Diabetes Other        GRANDFATHER  . Esophageal cancer Neg Hx   . Rectal cancer Neg Hx   . Stomach cancer Neg Hx     No Known Allergies  Medication list has been reviewed and updated.  Current Outpatient Medications on File Prior to Visit  Medication Sig Dispense Refill  . ALPRAZolam (XANAX) 0.5 MG tablet TAKE 1 TABLET BY MOUTH THREE TIMES A DAY AS NEEDED FOR ANXIETY OR SLEEP 60 tablet 0  . b complex vitamins tablet Take 1 tablet by mouth daily.    Marland Kitchen levocetirizine (XYZAL) 5 MG tablet TAKE 1 TABLET BY MOUTH EVERY DAY IN THE EVENING 90 tablet 0  . levothyroxine (SYNTHROID, LEVOTHROID) 25 MCG tablet 75 mcg daily before breakfast.   1  . liothyronine (CYTOMEL) 5 MCG tablet Take 5 mcg by mouth daily.    Marland Kitchen omeprazole (PRILOSEC) 20 MG  capsule TAKE 1 CAPSULE BY MOUTH EVERY DAY 90 capsule 3  . valACYclovir (VALTREX) 500 MG tablet TAKE 4 TABLETS (2GM) ONCE, REPEAT IN 12 HOURS AS NEEDED FOR COLD SORE 30 tablet 3   Current Facility-Administered Medications on File Prior to Visit  Medication Dose Route Frequency Provider Last Rate Last Admin  . 0.9 %  sodium chloride infusion  500 mL Intravenous Once Gatha Mayer, MD        Review of Systems:  As per HPI- otherwise negative.   Physical Examination: Vitals:   04/03/20 1436  BP: 98/66  Pulse: 83  Resp: 12  Temp: 98.1 F (36.7 C)  SpO2: 98%   Vitals:   04/03/20 1436  Weight: 114 lb 9.6 oz (52 kg)  Height: 5\' 1"  (1.549 m)   Body mass index is 21.65 kg/m. Ideal Body Weight: Weight in (lb) to have BMI = 25: 132  GEN: no acute distress.  Petite build, looks well  HEENT: Atraumatic, Normocephalic. Bilateral TM wnl, oropharynx normal.  PEERL,EOMI.   Tongue appears normal Ears and Nose: No external deformity. CV: RRR, No M/G/R. No JVD. No thrill. No extra heart sounds. PULM: CTA B, no wheezes, crackles, rhonchi. No retractions. No resp. distress. No accessory muscle use. ABD: S, NT, ND, +BS. No rebound. No HSM. EXTR: No c/c/e PSYCH: Normally interactive. Conversant.  Left axilla and breast normal to exam -no masses, dimpling, or any sign of abscess.  The patient does have breast implants.  I am somewhat able to reproduce her discomfort by pressing on her left ribs   Assessment and Plan: Left axillary pain - Plan: MM DIAG BREAST TOMO BILATERAL, US BREAST COMPLETE UNI LEFT INC AXILLA  Pre-diabetes - Plan: Comprehensive metabolic panel, Hemoglobin A1c  Hyperthyroidism - Plan: TSH  Encounter for hepatitis C screening test for low risk patient - Plan: Hepatitis C antibody  Screening for deficiency anemia - Plan: CBC  Screening for hyperlipidemia - Plan: Lipid panel  Burning tongue - Plan: B12 and Folate Panel, Zinc, Vitamin B6, Ferritin  Patient is here  today with a couple of concerns.  She has noted some discomfort in her left armpit and breast area.  This may be related to recent dose of COVID-19 which can cause lymphadenopathy.  However, I am not able to palpate any enlarged lymph nodes.  The patient feels uneasy about this and would like to proceed with mammogram and ultrasound.  I have ordered this for her today Other labs pending as above.  We will attempt  to determine if any vitamin deficiency may be causing her burning tongue Will plan further follow- up pending labs.  This visit occurred during the SARS-CoV-2 public health emergency.  Safety protocols were in place, including screening questions prior to the visit, additional usage of staff PPE, and extensive cleaning of exam room while observing appropriate contact time as indicated for disinfecting solutions.    Signed Abbe Amsterdam, MD  Addendum 6/27, received her labs as below Message to patient Results for orders placed or performed in visit on 04/03/20  CBC  Result Value Ref Range   WBC 6.4 4.0 - 10.5 K/uL   RBC 4.68 3.87 - 5.11 Mil/uL   Platelets 306.0 150 - 400 K/uL   Hemoglobin 14.4 12.0 - 15.0 g/dL   HCT 24.5 36 - 46 %   MCV 92.2 78.0 - 100.0 fl   MCHC 33.4 30.0 - 36.0 g/dL   RDW 80.9 98.3 - 38.2 %  Comprehensive metabolic panel  Result Value Ref Range   Sodium 140 135 - 145 mEq/L   Potassium 4.5 3.5 - 5.1 mEq/L   Chloride 100 96 - 112 mEq/L   CO2 29 19 - 32 mEq/L   Glucose, Bld 92 70 - 99 mg/dL   BUN 19 6 - 23 mg/dL   Creatinine, Ser 5.05 0.40 - 1.20 mg/dL   Total Bilirubin 0.4 0.2 - 1.2 mg/dL   Alkaline Phosphatase 86 39 - 117 U/L   AST 19 0 - 37 U/L   ALT 12 0 - 35 U/L   Total Protein 6.8 6.0 - 8.3 g/dL   Albumin 4.5 3.5 - 5.2 g/dL   GFR 39.76 >73.41 mL/min   Calcium 9.7 8.4 - 10.5 mg/dL  Hemoglobin P3X  Result Value Ref Range   Hgb A1c MFr Bld 5.6 4.6 - 6.5 %  Lipid panel  Result Value Ref Range   Cholesterol 205 (H) 0 - 200 mg/dL    Triglycerides 902.4 0 - 149 mg/dL   HDL 09.73 >53.29 mg/dL   VLDL 92.4 0.0 - 26.8 mg/dL   LDL Cholesterol 341 (H) 0 - 99 mg/dL   Total CHOL/HDL Ratio 3    NonHDL 135.92   TSH  Result Value Ref Range   TSH 1.33 0.35 - 4.50 uIU/mL  Hepatitis C antibody  Result Value Ref Range   Hepatitis C Ab NON-REACTIVE NON-REACTI   SIGNAL TO CUT-OFF 0.01 <1.00  B12 and Folate Panel  Result Value Ref Range   Vitamin B-12 692 211 - 911 pg/mL   Folate >24.8 >5.9 ng/mL  Zinc  Result Value Ref Range   Zinc 71 60 - 130 mcg/dL  Ferritin  Result Value Ref Range   Ferritin 37.7 10.0 - 291.0 ng/mL

## 2020-04-04 ENCOUNTER — Encounter: Payer: Self-pay | Admitting: Family Medicine

## 2020-04-04 DIAGNOSIS — K146 Glossodynia: Secondary | ICD-10-CM

## 2020-04-04 LAB — B12 AND FOLATE PANEL
Folate: >24.8 ng/mL (ref >5.9)
Vitamin B-12: 692 pg/mL (ref 211–911)

## 2020-04-04 LAB — FERRITIN: Ferritin: 37.7 ng/mL (ref 10.0–291.0)

## 2020-04-04 LAB — COMPREHENSIVE METABOLIC PANEL
ALT: 12 U/L (ref 0–35)
AST: 19 U/L (ref 0–37)
Albumin: 4.5 g/dL (ref 3.5–5.2)
Alkaline Phosphatase: 86 U/L (ref 39–117)
BUN: 19 mg/dL (ref 6–23)
CO2: 29 mEq/L (ref 19–32)
Calcium: 9.7 mg/dL (ref 8.4–10.5)
Chloride: 100 mEq/L (ref 96–112)
Creatinine, Ser: 0.91 mg/dL (ref 0.40–1.20)
GFR: 65.02 mL/min (ref >60.00)
Glucose, Bld: 92 mg/dL (ref 70–99)
Potassium: 4.5 mEq/L (ref 3.5–5.1)
Sodium: 140 mEq/L (ref 135–145)
Total Bilirubin: 0.4 mg/dL (ref 0.2–1.2)
Total Protein: 6.8 g/dL (ref 6.0–8.3)

## 2020-04-04 LAB — CBC
HCT: 43.2 % (ref 36.0–46.0)
Hemoglobin: 14.4 g/dL (ref 12.0–15.0)
MCHC: 33.4 g/dL (ref 30.0–36.0)
MCV: 92.2 fl (ref 78.0–100.0)
Platelets: 306 10*3/uL (ref 150.0–400.0)
RBC: 4.68 Mil/uL (ref 3.87–5.11)
RDW: 13.5 % (ref 11.5–15.5)
WBC: 6.4 10*3/uL (ref 4.0–10.5)

## 2020-04-04 LAB — TSH: TSH: 1.33 u[IU]/mL (ref 0.35–4.50)

## 2020-04-04 LAB — HEMOGLOBIN A1C: Hgb A1c MFr Bld: 5.6 % (ref 4.6–6.5)

## 2020-04-04 LAB — LIPID PANEL
Cholesterol: 205 mg/dL — ABNORMAL HIGH (ref 0–200)
HDL: 68.9 mg/dL (ref >39.00)
LDL Cholesterol: 110 mg/dL — ABNORMAL HIGH (ref 0–99)
NonHDL: 135.92
Total CHOL/HDL Ratio: 3
Triglycerides: 128 mg/dL (ref 0.0–149.0)
VLDL: 25.6 mg/dL (ref 0.0–40.0)

## 2020-04-06 ENCOUNTER — Encounter: Payer: Self-pay | Admitting: Family Medicine

## 2020-04-07 ENCOUNTER — Encounter: Payer: Self-pay | Admitting: Family Medicine

## 2020-04-07 MED ORDER — AMITRIPTYLINE HCL 10 MG PO TABS: 10.0000 mg | ORAL_TABLET | Freq: Every day | ORAL | 3 refills | Status: DC

## 2020-04-07 NOTE — Addendum Note (Signed)
Addended by: Abbe Amsterdam C on: 04/07/2020 08:26 PM   Modules accepted: Orders

## 2020-04-08 ENCOUNTER — Encounter: Payer: Self-pay | Admitting: Family Medicine

## 2020-04-08 LAB — HEPATITIS C ANTIBODY
Hepatitis C Ab: NONREACTIVE
SIGNAL TO CUT-OFF: 0.01 (ref <1.00)

## 2020-04-08 LAB — VITAMIN B6: Vitamin B6: 141.6 ng/mL — ABNORMAL HIGH (ref 2.1–21.7)

## 2020-04-08 LAB — ZINC: Zinc: 71 ug/dL (ref 60–130)

## 2020-04-11 ENCOUNTER — Encounter: Payer: Self-pay | Admitting: Family Medicine

## 2020-04-11 ENCOUNTER — Other Ambulatory Visit: Payer: Self-pay

## 2020-04-11 ENCOUNTER — Ambulatory Visit (INDEPENDENT_AMBULATORY_CARE_PROVIDER_SITE_OTHER): Payer: BC Managed Care – PPO | Admitting: Family Medicine

## 2020-04-11 VITALS — BP 110/68 | HR 79 | Temp 98.3°F | Ht 62.0 in | Wt 115.0 lb

## 2020-04-11 DIAGNOSIS — R109 Unspecified abdominal pain: Secondary | ICD-10-CM | POA: Diagnosis not present

## 2020-04-11 MED ORDER — PREDNISONE 20 MG PO TABS: 40.0000 mg | ORAL_TABLET | Freq: Every day | ORAL | 0 refills | Status: AC

## 2020-04-11 NOTE — Progress Notes (Signed)
Musculoskeletal Exam  Patient: Melanie Taylor DOB: [birth year]  DOS: 04/11/2020  SUBJECTIVE:  Chief Complaint:   Chief Complaint  Patient presents with  . Follow-up    rib pain    Melanie Taylor is a 54 y.o.  female for evaluation and treatment of b/l rib pain.   Onset:  5 weeks ago. No inj or change in activity.  Location: lateral lower rib cage b/l Character:  dull and tender  Pain with chest press Progression of issue:  is unchanged Associated symptoms: no bruising, redness, swelling; breathing largely normal Treatment: to date has been ice and OTC NSAIDS.   Neurovascular symptoms: no  Past Medical History:  Diagnosis Date  . Allergy   . Arthritis   . GERD (gastroesophageal reflux disease)   . IBS (irritable bowel syndrome)   . Thyroid disease    hyperthyroidism    Objective: VITAL SIGNS: BP 110/68 (BP Location: Left Arm, Patient Position: Sitting, Cuff Size: Normal)   Pulse 79   Temp 98.3 F (36.8 C) (Temporal)   Ht 5\' 2"  (1.575 m)   Wt 115 lb (52.2 kg)   LMP 08/26/2016   SpO2 97%   BMI 21.03 kg/m  Constitutional: Well formed, well developed. No acute distress. Cardiovascular: Brisk cap refill Thorax & Lungs: No accessory muscle use Musculoskeletal: Torso Nml ROM of UE's. No pain with resisted strength testing of pecs Tenderness to palpation: yes over lateral rib cage over SA and SP b/l, worse on L Deformity: no Ecchymosis: no No crepitus or deformity.  Neurologic: Normal sensory function. No focal deficits noted. DTR's equal and symmetric in UE's. No clonus. Psychiatric: Normal mood. Age appropriate judgment and insight. Alert & oriented x 3.    Assessment:  Side pain - Plan: predniSONE (DELTASONE) 20 MG tablet, Ambulatory referral to Physical Therapy  Plan: Orders as above. Suspect serratus anterior/posterior involvement. Stretch/yoga, ice, heat, Tylenol, pred burst. Avoid NSAIDs while on pred. No change in workup rec'd by Dr. 08/28/2016.  F/u prn.  The patient voiced understanding and agreement to the plan.   Patsy Lager [place], DO 04/11/20  3:09 PM

## 2020-04-11 NOTE — Patient Instructions (Addendum)
If you do not hear anything about your referral in the next 1-2 weeks, call our office and ask for an update.  Heat (pad or rice pillow in microwave) over affected area, 10-15 minutes twice daily.   Ice/cold pack over area for 10-15 min twice daily.  OK to take Tylenol 1000 mg (2 extra strength tabs) or 975 mg (3 regular strength tabs) every 6 hours as needed.  No anti-inflammatories while on prednisone.  Add stretches over the side to help rehab this. You may not need the physical therapy.  Let us know if you need anything.

## 2020-04-22 ENCOUNTER — Ambulatory Visit: Admission: RE | Admit: 2020-04-22 | Payer: BC Managed Care – PPO | Source: Ambulatory Visit

## 2020-04-22 ENCOUNTER — Ambulatory Visit
Admission: RE | Admit: 2020-04-22 | Discharge: 2020-04-22 | Disposition: A | Discharge status: Home / Self Care | Payer: BC Managed Care – PPO | Source: Ambulatory Visit | Attending: Family Medicine | Admitting: Family Medicine

## 2020-04-22 ENCOUNTER — Other Ambulatory Visit: Payer: Self-pay

## 2020-04-22 DIAGNOSIS — M79622 Pain in left upper arm: Secondary | ICD-10-CM

## 2020-04-22 DIAGNOSIS — R922 Inconclusive mammogram: Secondary | ICD-10-CM | POA: Diagnosis not present

## 2020-05-02 ENCOUNTER — Other Ambulatory Visit: Payer: Self-pay | Admitting: Family Medicine

## 2020-05-02 DIAGNOSIS — K146 Glossodynia: Secondary | ICD-10-CM

## 2020-05-05 ENCOUNTER — Other Ambulatory Visit: Payer: Self-pay | Admitting: Family Medicine

## 2020-05-05 DIAGNOSIS — F419 Anxiety disorder, unspecified: Secondary | ICD-10-CM

## 2020-05-05 NOTE — Telephone Encounter (Signed)
Requesting:Alprazolam Contract: none UDS: none Last Visit: 04/03/2020 Next Visit: none Last Refill: 02/22/2020  Please Advise

## 2020-05-14 ENCOUNTER — Encounter: Payer: Self-pay | Admitting: Family Medicine

## 2020-05-15 ENCOUNTER — Encounter: Payer: Self-pay | Admitting: Family Medicine

## 2020-05-21 DIAGNOSIS — M25512 Pain in left shoulder: Secondary | ICD-10-CM | POA: Diagnosis not present

## 2020-05-21 DIAGNOSIS — M7542 Impingement syndrome of left shoulder: Secondary | ICD-10-CM | POA: Diagnosis not present

## 2020-06-06 DIAGNOSIS — M25512 Pain in left shoulder: Secondary | ICD-10-CM | POA: Diagnosis not present

## 2020-06-11 DIAGNOSIS — S43432D Superior glenoid labrum lesion of left shoulder, subsequent encounter: Secondary | ICD-10-CM | POA: Diagnosis not present

## 2020-06-22 ENCOUNTER — Other Ambulatory Visit: Payer: Self-pay | Admitting: Family Medicine

## 2020-06-22 DIAGNOSIS — J301 Allergic rhinitis due to pollen: Secondary | ICD-10-CM

## 2020-06-26 ENCOUNTER — Encounter: Payer: Self-pay | Admitting: Family Medicine

## 2020-07-21 DIAGNOSIS — R059 Cough, unspecified: Secondary | ICD-10-CM | POA: Diagnosis not present

## 2020-07-21 DIAGNOSIS — Z03818 Encounter for observation for suspected exposure to other biological agents ruled out: Secondary | ICD-10-CM | POA: Diagnosis not present

## 2020-07-22 DIAGNOSIS — M7542 Impingement syndrome of left shoulder: Secondary | ICD-10-CM | POA: Diagnosis not present

## 2020-07-22 DIAGNOSIS — Y999 Unspecified external cause status: Secondary | ICD-10-CM | POA: Diagnosis not present

## 2020-07-22 DIAGNOSIS — G8918 Other acute postprocedural pain: Secondary | ICD-10-CM | POA: Diagnosis not present

## 2020-07-22 DIAGNOSIS — S43432A Superior glenoid labrum lesion of left shoulder, initial encounter: Secondary | ICD-10-CM | POA: Diagnosis not present

## 2020-07-22 DIAGNOSIS — X58XXXA Exposure to other specified factors, initial encounter: Secondary | ICD-10-CM | POA: Diagnosis not present

## 2020-07-22 DIAGNOSIS — S46012A Strain of muscle(s) and tendon(s) of the rotator cuff of left shoulder, initial encounter: Secondary | ICD-10-CM | POA: Diagnosis not present

## 2020-07-29 ENCOUNTER — Encounter: Payer: Self-pay | Admitting: Family Medicine

## 2020-07-29 NOTE — Telephone Encounter (Signed)
FYI please advise of patient needs appointment for evaluation.

## 2020-07-30 DIAGNOSIS — M25512 Pain in left shoulder: Secondary | ICD-10-CM | POA: Diagnosis not present

## 2020-08-04 DIAGNOSIS — Z4789 Encounter for other orthopedic aftercare: Secondary | ICD-10-CM | POA: Diagnosis not present

## 2020-08-07 DIAGNOSIS — M25512 Pain in left shoulder: Secondary | ICD-10-CM | POA: Diagnosis not present

## 2020-08-14 DIAGNOSIS — M25512 Pain in left shoulder: Secondary | ICD-10-CM | POA: Diagnosis not present

## 2020-08-22 DIAGNOSIS — M25512 Pain in left shoulder: Secondary | ICD-10-CM | POA: Diagnosis not present

## 2020-08-28 DIAGNOSIS — M25512 Pain in left shoulder: Secondary | ICD-10-CM | POA: Diagnosis not present

## 2020-09-02 DIAGNOSIS — M25512 Pain in left shoulder: Secondary | ICD-10-CM | POA: Diagnosis not present

## 2020-09-10 DIAGNOSIS — M25512 Pain in left shoulder: Secondary | ICD-10-CM | POA: Diagnosis not present

## 2020-09-12 DIAGNOSIS — M25512 Pain in left shoulder: Secondary | ICD-10-CM | POA: Diagnosis not present

## 2020-09-18 DIAGNOSIS — M25512 Pain in left shoulder: Secondary | ICD-10-CM | POA: Diagnosis not present

## 2020-09-23 DIAGNOSIS — M25512 Pain in left shoulder: Secondary | ICD-10-CM | POA: Diagnosis not present

## 2020-09-25 DIAGNOSIS — M25512 Pain in left shoulder: Secondary | ICD-10-CM | POA: Diagnosis not present

## 2020-09-29 DIAGNOSIS — M25512 Pain in left shoulder: Secondary | ICD-10-CM | POA: Diagnosis not present

## 2020-10-02 DIAGNOSIS — M25512 Pain in left shoulder: Secondary | ICD-10-CM | POA: Diagnosis not present

## 2020-10-14 DIAGNOSIS — M25512 Pain in left shoulder: Secondary | ICD-10-CM | POA: Diagnosis not present

## 2020-10-16 DIAGNOSIS — M25512 Pain in left shoulder: Secondary | ICD-10-CM | POA: Diagnosis not present

## 2020-10-20 DIAGNOSIS — M25512 Pain in left shoulder: Secondary | ICD-10-CM | POA: Diagnosis not present

## 2020-10-30 DIAGNOSIS — M25512 Pain in left shoulder: Secondary | ICD-10-CM | POA: Diagnosis not present

## 2020-11-06 DIAGNOSIS — M25512 Pain in left shoulder: Secondary | ICD-10-CM | POA: Diagnosis not present

## 2020-11-10 DIAGNOSIS — M25512 Pain in left shoulder: Secondary | ICD-10-CM | POA: Diagnosis not present

## 2020-11-12 DIAGNOSIS — M25512 Pain in left shoulder: Secondary | ICD-10-CM | POA: Diagnosis not present

## 2020-11-17 DIAGNOSIS — M25512 Pain in left shoulder: Secondary | ICD-10-CM | POA: Diagnosis not present

## 2020-11-19 ENCOUNTER — Other Ambulatory Visit: Payer: Self-pay | Admitting: Family Medicine

## 2020-11-19 ENCOUNTER — Encounter: Payer: Self-pay | Admitting: Family Medicine

## 2020-11-19 DIAGNOSIS — F419 Anxiety disorder, unspecified: Secondary | ICD-10-CM

## 2020-11-20 DIAGNOSIS — M25512 Pain in left shoulder: Secondary | ICD-10-CM | POA: Diagnosis not present

## 2020-11-24 DIAGNOSIS — M25512 Pain in left shoulder: Secondary | ICD-10-CM | POA: Diagnosis not present

## 2020-11-25 DIAGNOSIS — M25512 Pain in left shoulder: Secondary | ICD-10-CM | POA: Diagnosis not present

## 2020-11-27 DIAGNOSIS — M25512 Pain in left shoulder: Secondary | ICD-10-CM | POA: Diagnosis not present

## 2020-12-04 DIAGNOSIS — M25512 Pain in left shoulder: Secondary | ICD-10-CM | POA: Diagnosis not present

## 2020-12-05 DIAGNOSIS — E038 Other specified hypothyroidism: Secondary | ICD-10-CM | POA: Diagnosis not present

## 2020-12-10 DIAGNOSIS — M25512 Pain in left shoulder: Secondary | ICD-10-CM | POA: Diagnosis not present

## 2020-12-16 DIAGNOSIS — M25512 Pain in left shoulder: Secondary | ICD-10-CM | POA: Diagnosis not present

## 2020-12-18 DIAGNOSIS — M25512 Pain in left shoulder: Secondary | ICD-10-CM | POA: Diagnosis not present

## 2020-12-22 DIAGNOSIS — M25512 Pain in left shoulder: Secondary | ICD-10-CM | POA: Diagnosis not present

## 2020-12-25 DIAGNOSIS — M25512 Pain in left shoulder: Secondary | ICD-10-CM | POA: Diagnosis not present

## 2021-01-02 DIAGNOSIS — E279 Disorder of adrenal gland, unspecified: Secondary | ICD-10-CM | POA: Diagnosis not present

## 2021-01-02 DIAGNOSIS — E2839 Other primary ovarian failure: Secondary | ICD-10-CM | POA: Diagnosis not present

## 2021-01-02 DIAGNOSIS — E282 Polycystic ovarian syndrome: Secondary | ICD-10-CM | POA: Diagnosis not present

## 2021-01-19 ENCOUNTER — Encounter: Payer: Self-pay | Admitting: Family Medicine

## 2021-01-19 NOTE — Telephone Encounter (Signed)
Documented in patients chart for encounter. Here is original message.

## 2021-01-21 DIAGNOSIS — Z4789 Encounter for other orthopedic aftercare: Secondary | ICD-10-CM | POA: Diagnosis not present

## 2021-01-21 DIAGNOSIS — Z9889 Other specified postprocedural states: Secondary | ICD-10-CM | POA: Diagnosis not present

## 2021-02-10 DIAGNOSIS — Z23 Encounter for immunization: Secondary | ICD-10-CM | POA: Diagnosis not present

## 2021-03-02 DIAGNOSIS — M9904 Segmental and somatic dysfunction of sacral region: Secondary | ICD-10-CM | POA: Diagnosis not present

## 2021-03-02 DIAGNOSIS — M9903 Segmental and somatic dysfunction of lumbar region: Secondary | ICD-10-CM | POA: Diagnosis not present

## 2021-03-02 DIAGNOSIS — M5417 Radiculopathy, lumbosacral region: Secondary | ICD-10-CM | POA: Diagnosis not present

## 2021-03-02 DIAGNOSIS — M9902 Segmental and somatic dysfunction of thoracic region: Secondary | ICD-10-CM | POA: Diagnosis not present

## 2021-03-12 ENCOUNTER — Encounter: Payer: Self-pay | Admitting: Family Medicine

## 2021-03-12 DIAGNOSIS — F419 Anxiety disorder, unspecified: Secondary | ICD-10-CM

## 2021-03-12 MED ORDER — ALPRAZOLAM 0.5 MG PO TABS: ORAL_TABLET | ORAL | 0 refills | Status: DC

## 2021-03-12 NOTE — Telephone Encounter (Signed)
Dr. Patsy Lager you are full Monday and she is leaving town Tuesday. Do you want to override? She is scheduling cpe in June.

## 2021-03-25 ENCOUNTER — Other Ambulatory Visit: Payer: Self-pay | Admitting: Family Medicine

## 2021-03-25 DIAGNOSIS — B001 Herpesviral vesicular dermatitis: Secondary | ICD-10-CM

## 2021-03-26 ENCOUNTER — Encounter: Payer: BC Managed Care – PPO | Admitting: Family Medicine

## 2021-03-30 DIAGNOSIS — M9902 Segmental and somatic dysfunction of thoracic region: Secondary | ICD-10-CM | POA: Diagnosis not present

## 2021-03-30 DIAGNOSIS — M9903 Segmental and somatic dysfunction of lumbar region: Secondary | ICD-10-CM | POA: Diagnosis not present

## 2021-03-30 DIAGNOSIS — M5417 Radiculopathy, lumbosacral region: Secondary | ICD-10-CM | POA: Diagnosis not present

## 2021-03-30 DIAGNOSIS — M9904 Segmental and somatic dysfunction of sacral region: Secondary | ICD-10-CM | POA: Diagnosis not present

## 2021-04-24 ENCOUNTER — Encounter: Payer: BC Managed Care – PPO | Admitting: Family Medicine

## 2021-04-30 NOTE — Patient Instructions (Addendum)
Good to see you today- I will be in touch with your labs asap  Keep up the good work!

## 2021-04-30 NOTE — Progress Notes (Addendum)
[facility] at [facility] [address], [address] [place], [place] [address] [phone]  Date:  05/06/2021   Name:  Melanie Taylor   DOB:  [birth year]   MRN:  [id number]  PCP:  Pearline Cables, MD    Chief Complaint: Annual Exam (CPE)   History of Present Illness:  Melanie Taylor is a 55 y.o. very pleasant female patient who presents with the following:  Pt seen today for a CPE- history of IBS, hypothyroidism, fatigue and anxiety Last visit with myself one year ago   She notes that she is generally doing well but is somewhat bothered by menopausal hot flashes Last menses 2-3 years ago  She has been hungrier and has put on some weight -on review she has gained very little  Wt Readings from Last 3 Encounters:  05/06/21 117 lb 12.8 oz (53.4 kg)  04/11/20 115 lb (52.2 kg)  04/03/20 114 lb 9.6 oz (52 kg)   Covid booster- she had 3 doses so far, we discussed her getting a fourth dose Pap  2019, can update today if she likes  Mammo- one year ago  Colon  2020, 10 year follow-up recommended  Shingrix - done  Labs one year ago   Xanax-uses on occasion Levothyroxine Cytomel Valtrex prn   She gets plenty of exercise Does not use tobacco  Patient Active Problem List   Diagnosis Date Noted   IBS (irritable bowel syndrome) 01/02/2016   Atypical chest pain 06/30/2015   Hyperthyroidism 05/24/2011   Fatigue 05/24/2011   Anxiety 05/24/2011   Decreased libido 05/24/2011   GERD 04/10/2008   ABDOMINAL PAIN-EPIGASTRIC 04/10/2008    Past Medical History:  Diagnosis Date   Allergy    Arthritis    GERD (gastroesophageal reflux disease)    IBS (irritable bowel syndrome)    Thyroid disease    hyperthyroidism    Past Surgical History:  Procedure Laterality Date   AUGMENTATION MAMMAPLASTY  11/2004   CESAREAN SECTION  06/2003   one time   COLONOSCOPY     over 10 years ago with Dr Kinnie Scales   KNEE SURGERY  2003   left   SHOULDER SURGERY   2010   right shoulder    Social History   Tobacco Use   Smoking status: Never   Smokeless tobacco: Never  Vaping Use   Vaping Use: Never used  Substance Use Topics   Alcohol use: Yes    Comment: socially   Drug use: No    Family History  Problem Relation Age of Onset   Alcohol abuse Father    Heart attack Father    Sudden death Father    Diabetes Father    Heart disease Father    Arthritis Mother    Hypothyroidism Mother    Hypothyroidism Maternal Grandmother    Cancer Maternal Grandfather        COLON   Colon cancer Maternal Grandfather    Diabetes Other        GRANDFATHER   Esophageal cancer Neg Hx    Rectal cancer Neg Hx    Stomach cancer Neg Hx     No Known Allergies  Medication list has been reviewed and updated.  Current Outpatient Medications on File Prior to Visit  Medication Sig Dispense Refill   ALPRAZolam (XANAX) 0.5 MG tablet TAKE 1 TABLET BY MOUTH THREE TIMES A DAY AS NEEDED FOR ANXIETY OR SLEEP 60 tablet 0   b  complex vitamins tablet Take 1 tablet by mouth daily.     levocetirizine (XYZAL) 5 MG tablet TAKE 1 TABLET BY MOUTH EVERY DAY IN THE EVENING 90 tablet 1   levothyroxine (SYNTHROID, LEVOTHROID) 25 MCG tablet 75 mcg daily before breakfast.   1   liothyronine (CYTOMEL) 5 MCG tablet Take 5 mcg by mouth daily.     omeprazole (PRILOSEC) 20 MG capsule TAKE 1 CAPSULE BY MOUTH EVERY DAY 90 capsule 3   valACYclovir (VALTREX) 500 MG tablet TAKE 4 TABLETS (2GM) ONCE, REPEAT IN 12 HOURS AS NEEDED FOR COLD SORE 30 tablet 3   Current Facility-Administered Medications on File Prior to Visit  Medication Dose Route Frequency Provider Last Rate Last Admin   0.9 %  sodium chloride infusion  500 mL Intravenous Once Iva Boop, MD        Review of Systems:  As per HPI- otherwise negative.   Physical Examination: Vitals:   05/06/21 0920  BP: 108/68  Pulse: 64  Temp: 97.8 F (36.6 C)  SpO2: 99%   Vitals:   05/06/21 0920  Weight: 117 lb 12.8 oz  (53.4 kg)  Height: 5\' 1"  (1.549 m)   Body mass index is 22.26 kg/m. Ideal Body Weight: Weight in (lb) to have BMI = 25: 132  GEN: no acute distress. Normal weight, looks well  HEENT: Atraumatic, Normocephalic.  Bilateral TM wnl, oropharynx normal.  PEERL,EOMI.   Ears and Nose: No external deformity. CV: RRR, No M/G/R. No JVD. No thrill. No extra heart sounds. PULM: CTA B, no wheezes, crackles, rhonchi. No retractions. No resp. distress. No accessory muscle use. ABD: S, NT, ND, +BS. No rebound. No HSM. EXTR: No c/c/e PSYCH: Normally interactive. Conversant.  Pelvic: normal, no vaginal lesions or discharge. Uterus normal, no CMT, no adnexal tendereness or masses   Assessment and Plan: Physical exam  Pre-diabetes - Plan: Comprehensive metabolic panel, Hemoglobin A1c  Hyperthyroidism - Plan: TSH  Screening for deficiency anemia - Plan: CBC  Screening for hyperlipidemia - Plan: Lipid panel  Fatigue, unspecified type - Plan: TSH, VITAMIN D 25 Hydroxy (Vit-D Deficiency, Fractures)  Screening for cervical cancer - Plan: Cytology - PAP  Physical exam today Encouraged healthy diet and exercise routine Will plan further follow- up pending labs. We discussed her menopausal hot flashes, I offered to start her on an SSRI.  For the time being she declines, she will continue to monitor  This visit occurred during the SARS-CoV-2 public health emergency.  Safety protocols were in place, including screening questions prior to the visit, additional usage of staff PPE, and extensive cleaning of exam room while observing appropriate contact time as indicated for disinfecting solutions.   Signed , MD  Received labs as below, message to patient  Results for orders placed or performed in visit on 05/06/21  CBC  Result Value Ref Range   WBC 4.2 4.0 - 10.5 K/uL   RBC 4.48 3.87 - 5.11 Mil/uL   Platelets 285.0 150.0 - 400.0 K/uL   Hemoglobin 13.5 12.0 - 15.0 g/dL   HCT 05/08/21 62.7  - 03.5 %   MCV 89.9 78.0 - 100.0 fl   MCHC 33.6 30.0 - 36.0 g/dL   RDW 00.9 38.1 - 82.9 %  Comprehensive metabolic panel  Result Value Ref Range   Sodium 138 135 - 145 mEq/L   Potassium 4.3 3.5 - 5.1 mEq/L   Chloride 100 96 - 112 mEq/L   CO2 31 19 - 32 mEq/L  Glucose, Bld 82 70 - 99 mg/dL   BUN 13 6 - 23 mg/dL   Creatinine, Ser 2.44 0.40 - 1.20 mg/dL   Total Bilirubin 0.5 0.2 - 1.2 mg/dL   Alkaline Phosphatase 78 39 - 117 U/L   AST 22 0 - 37 U/L   ALT 13 0 - 35 U/L   Total Protein 6.8 6.0 - 8.3 g/dL   Albumin 4.4 3.5 - 5.2 g/dL   GFR 01.02 >72.53 mL/min   Calcium 9.7 8.4 - 10.5 mg/dL  Hemoglobin G6Y  Result Value Ref Range   Hgb A1c MFr Bld 5.7 4.6 - 6.5 %  Lipid panel  Result Value Ref Range   Cholesterol 204 (H) 0 - 200 mg/dL   Triglycerides 40.3 0.0 - 149.0 mg/dL   HDL 47.42 >59.56 mg/dL   VLDL 38.7 0.0 - 56.4 mg/dL   LDL Cholesterol 332 (H) 0 - 99 mg/dL   Total CHOL/HDL Ratio 3    NonHDL 125.14   TSH  Result Value Ref Range   TSH 1.27 0.35 - 5.50 uIU/mL  VITAMIN D 25 Hydroxy (Vit-D Deficiency, Fractures)  Result Value Ref Range   VITD 65.13 30.00 - 100.00 ng/mL

## 2021-05-04 DIAGNOSIS — M9904 Segmental and somatic dysfunction of sacral region: Secondary | ICD-10-CM | POA: Diagnosis not present

## 2021-05-04 DIAGNOSIS — M9903 Segmental and somatic dysfunction of lumbar region: Secondary | ICD-10-CM | POA: Diagnosis not present

## 2021-05-04 DIAGNOSIS — M5417 Radiculopathy, lumbosacral region: Secondary | ICD-10-CM | POA: Diagnosis not present

## 2021-05-04 DIAGNOSIS — M9902 Segmental and somatic dysfunction of thoracic region: Secondary | ICD-10-CM | POA: Diagnosis not present

## 2021-05-06 ENCOUNTER — Encounter: Payer: Self-pay | Admitting: Family Medicine

## 2021-05-06 ENCOUNTER — Other Ambulatory Visit: Payer: Self-pay

## 2021-05-06 ENCOUNTER — Ambulatory Visit (INDEPENDENT_AMBULATORY_CARE_PROVIDER_SITE_OTHER): Payer: BC Managed Care – PPO | Admitting: Family Medicine

## 2021-05-06 ENCOUNTER — Other Ambulatory Visit (HOSPITAL_COMMUNITY)
Admission: RE | Admit: 2021-05-06 | Discharge: 2021-05-06 | Disposition: A | Discharge status: Home / Self Care | Payer: BC Managed Care – PPO | Source: Ambulatory Visit | Attending: Family Medicine | Admitting: Family Medicine

## 2021-05-06 VITALS — BP 108/68 | HR 64 | Temp 97.8°F | Ht 61.0 in | Wt 117.8 lb

## 2021-05-06 DIAGNOSIS — R7303 Prediabetes: Secondary | ICD-10-CM

## 2021-05-06 DIAGNOSIS — Z124 Encounter for screening for malignant neoplasm of cervix: Secondary | ICD-10-CM | POA: Insufficient documentation

## 2021-05-06 DIAGNOSIS — R5383 Other fatigue: Secondary | ICD-10-CM | POA: Diagnosis not present

## 2021-05-06 DIAGNOSIS — Z0000 Encounter for general adult medical examination without abnormal findings: Secondary | ICD-10-CM

## 2021-05-06 DIAGNOSIS — Z13220 Encounter for screening for lipoid disorders: Secondary | ICD-10-CM | POA: Diagnosis not present

## 2021-05-06 DIAGNOSIS — Z130 Encounter for screening for diseases of the blood and blood-forming organs and certain disorders involving the immune mechanism: Secondary | ICD-10-CM | POA: Diagnosis not present

## 2021-05-06 DIAGNOSIS — E0590 Thyrotoxicosis, unspecified without thyrotoxic crisis or storm: Secondary | ICD-10-CM | POA: Diagnosis not present

## 2021-05-06 LAB — HEMOGLOBIN A1C: Hgb A1c MFr Bld: 5.7 % (ref 4.6–6.5)

## 2021-05-06 LAB — COMPREHENSIVE METABOLIC PANEL
ALT: 13 U/L (ref 0–35)
AST: 22 U/L (ref 0–37)
Albumin: 4.4 g/dL (ref 3.5–5.2)
Alkaline Phosphatase: 78 U/L (ref 39–117)
BUN: 13 mg/dL (ref 6–23)
CO2: 31 mEq/L (ref 19–32)
Calcium: 9.7 mg/dL (ref 8.4–10.5)
Chloride: 100 mEq/L (ref 96–112)
Creatinine, Ser: 0.9 mg/dL (ref 0.40–1.20)
GFR: 73.41 mL/min (ref >60.00)
Glucose, Bld: 82 mg/dL (ref 70–99)
Potassium: 4.3 mEq/L (ref 3.5–5.1)
Sodium: 138 mEq/L (ref 135–145)
Total Bilirubin: 0.5 mg/dL (ref 0.2–1.2)
Total Protein: 6.8 g/dL (ref 6.0–8.3)

## 2021-05-06 LAB — LIPID PANEL
Cholesterol: 204 mg/dL — ABNORMAL HIGH (ref 0–200)
HDL: 79 mg/dL (ref >39.00)
LDL Cholesterol: 113 mg/dL — ABNORMAL HIGH (ref 0–99)
NonHDL: 125.14
Total CHOL/HDL Ratio: 3
Triglycerides: 60 mg/dL (ref 0.0–149.0)
VLDL: 12 mg/dL (ref 0.0–40.0)

## 2021-05-06 LAB — CBC
HCT: 40.3 % (ref 36.0–46.0)
Hemoglobin: 13.5 g/dL (ref 12.0–15.0)
MCHC: 33.6 g/dL (ref 30.0–36.0)
MCV: 89.9 fl (ref 78.0–100.0)
Platelets: 285 10*3/uL (ref 150.0–400.0)
RBC: 4.48 Mil/uL (ref 3.87–5.11)
RDW: 14.3 % (ref 11.5–15.5)
WBC: 4.2 10*3/uL (ref 4.0–10.5)

## 2021-05-06 LAB — VITAMIN D 25 HYDROXY (VIT D DEFICIENCY, FRACTURES): VITD: 65.13 ng/mL (ref 30.00–100.00)

## 2021-05-06 LAB — TSH: TSH: 1.27 u[IU]/mL (ref 0.35–5.50)

## 2021-05-08 ENCOUNTER — Encounter: Payer: Self-pay | Admitting: Family Medicine

## 2021-05-08 LAB — CYTOLOGY - PAP
Comment: NEGATIVE
Diagnosis: NEGATIVE
High risk HPV: NEGATIVE

## 2021-06-08 ENCOUNTER — Encounter: Payer: Self-pay | Admitting: Family Medicine

## 2021-06-15 NOTE — Progress Notes (Signed)
[facility] at [facility] [address], [address] [place], [place] [address] [phone]  Date:  06/17/2021   Name:  Melanie Taylor   DOB:  10-20-1965   MRN:  [id number]  PCP:  Pearline Cables, MD    Chief Complaint: throat discomfort (And vocal chords worsening in the past year. Wonders if she needs to see ENT. 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 )   History of Present Illness:  Melanie Taylor is a 55 y.o. very pleasant female patient who presents with the following:  Pt recently contacted me with concern about her throat as follows:  So this is a weird message and I have debated on whether or not to say anything but I think I want to try.  So I am having problems with my throat and it seems to be associated with how much I have to talk.  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  I have tried to read about vocal cord strain and I am already doing the things it says you can do.  Is there any possible medication to help with this?  Is there any chance I should have this checked out by an ENT?  It is almost exhausting to  be around people and try to converse sometimes. That doesn't seem normal to me.  Have you ever heard of anything like this?  In 2010 I had shoulder surgery and one of the vocal cords paralyzed but only for a couple weeks.  After that, I noticed it was easy for my voice to get tired.  The interesting thing about this is that I DO NOT get hoarse.  It is just that my throat becomes irritated and I find it tiresome to talk.  I feel like it is getting worse with time. I am wondering if this is associated with thyroid.  My thyroid has been normal last couple of checks.  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  She describes feeling very tired in her throat and voice  Her throat may feel mildly sore but not a severe ST She has tried things like hot tea and rest but they do not seem to help that much  She does NOT get hoarse It may take her several hours to recover once the symptoms begin Some days her symptoms may not start until she has spoken for hours No problems with swallowing No other unusual fatigue or weakness symptoms She does feel like she needs to clear her throat She does not feel like she is having reflux symptoms- she does get reflux at times and will use omeprazole for these sx when they occur   She does feel like the lower part  of her body feels tired and achy when she wakes up in the am- from the waist and hips down However she is sleeping well  She is getting diarrhea -after eating especially -for a long time  Causes some issues when she tries to go out to eat or travel.  She feels that she cannot trust her bowels and has to avoid eating to avoid a potential accident She has seen GI and they did a colonoscopy- all was ok  She is not using anything for this right now   Lab Results  Component Value Date   TSH 1.27 05/06/2021    Patient Active Problem List   Diagnosis Date Noted   IBS (irritable bowel syndrome) 01/02/2016   Atypical chest pain 06/30/2015   Hyperthyroidism 05/24/2011   Fatigue 05/24/2011   Anxiety 05/24/2011   Decreased libido 05/24/2011   GERD 04/10/2008   ABDOMINAL PAIN-EPIGASTRIC 04/10/2008    Past Medical History:  Diagnosis Date   Allergy    Arthritis     GERD (gastroesophageal reflux disease)    IBS (irritable bowel syndrome)    Thyroid disease    hyperthyroidism    Past Surgical History:  Procedure Laterality Date   AUGMENTATION MAMMAPLASTY  11/2004   CESAREAN SECTION  06/2003   one time   COLONOSCOPY     over 10 years ago with Dr Kinnie Scales   KNEE SURGERY  2003   left   SHOULDER SURGERY  2010   right shoulder    Social History   Tobacco Use   Smoking status: Never   Smokeless tobacco: Never  Vaping Use   Vaping Use: Never used  Substance Use Topics   Alcohol use: Yes    Comment: socially   Drug use: No    Family History  Problem Relation Age of Onset   Alcohol abuse Father    Heart attack Father    Sudden death Father    Diabetes Father    Heart disease Father    Arthritis Mother    Hypothyroidism Mother    Hypothyroidism Maternal Grandmother    Cancer Maternal Grandfather        COLON   Colon cancer Maternal Grandfather    Diabetes Other        GRANDFATHER   Esophageal cancer Neg Hx    Rectal cancer Neg Hx    Stomach cancer Neg Hx     No Known Allergies  Medication list has been reviewed and updated.  Current Outpatient Medications on File Prior to Visit  Medication Sig Dispense Refill   ALPRAZolam (XANAX) 0.5 MG tablet TAKE 1 TABLET BY MOUTH THREE TIMES A DAY AS NEEDED FOR ANXIETY OR SLEEP 60 tablet 0   b complex vitamins tablet Take 1 tablet by mouth daily.     levocetirizine (XYZAL) 5 MG tablet TAKE 1 TABLET BY MOUTH EVERY DAY IN THE EVENING 90 tablet 1   levothyroxine (SYNTHROID, LEVOTHROID) 25 MCG tablet 75 mcg daily before breakfast.   1   liothyronine (CYTOMEL) 5 MCG tablet Take 5 mcg by mouth daily.     omeprazole (PRILOSEC) 20 MG capsule TAKE 1 CAPSULE BY MOUTH EVERY DAY 90 capsule 3   valACYclovir (VALTREX) 500 MG tablet TAKE 4 TABLETS (2GM) ONCE, REPEAT IN 12 HOURS AS NEEDED FOR COLD SORE 30 tablet 3   Current Facility-Administered Medications on File Prior to Visit  Medication Dose  Route Frequency Provider Last Rate Last Admin   0.9 %  sodium chloride infusion  500 mL Intravenous Once Iva Boop, MD        Review of Systems:  As per HPI- otherwise negative.    Physical Examination: Vitals:   06/17/21 1128  BP: 110/62  Pulse: (!) 59  Resp: 18  Temp: 97.8 F (36.6 C)  SpO2: 100%   Vitals:   06/17/21 1128  Weight: 116 lb (52.6 kg)  Height: 5\' 2"  (1.575 m)   Body mass index is 21.22 kg/m. Ideal Body Weight: Weight in (lb) to have BMI = 25: 136.4  GEN: no acute distress.  Petite build, normal weight.  Looks well HEENT: Atraumatic, Normocephalic. Bilateral TM wnl, oropharynx normal.  PEERL,EOMI. voice quality in clinic is normal Ears and Nose: No external deformity. CV: RRR, No M/G/R. No JVD. No thrill. No extra heart sounds. PULM: CTA B, no wheezes, crackles, rhonchi. No retractions. No resp. distress. No accessory muscle use. EXTR: No c/c/e PSYCH: Normally interactive. Conversant.    Assessment and Plan: Voice fatigue - Plan: Ambulatory referral to ENT  Thyroiditis - Plan: THYROID  Need for influenza vaccination - Plan: Flu Vaccine QUAD 45mo+IM (Fluarix, Fluzone & Alfiuria Quad PF)  Functional diarrhea  Seen today with concern of voice fatigue but not hoarseness.  Referral to ear nose and throat for evaluation, possible oropharyngeal scope.  We will also repeat her thyroid ultrasound for history of thyroiditis  We will have her try Imodium as needed for diarrhea.  It sounds as though she may have a lactose intolerance.  I also suggested using lactase tablets as needed  Flu shot given   This visit occurred during the SARS-CoV-2 public health emergency.  Safety protocols were in place, including screening questions prior to the visit, additional usage of staff PPE, and extensive cleaning of exam room while observing appropriate contact time as indicated for disinfecting solutions.   Signed 5mo, MD

## 2021-06-17 ENCOUNTER — Ambulatory Visit (INDEPENDENT_AMBULATORY_CARE_PROVIDER_SITE_OTHER): Payer: BC Managed Care – PPO | Admitting: Family Medicine

## 2021-06-17 ENCOUNTER — Other Ambulatory Visit: Payer: Self-pay

## 2021-06-17 VITALS — BP 110/62 | HR 59 | Temp 97.8°F | Resp 18 | Ht 62.0 in | Wt 116.0 lb

## 2021-06-17 DIAGNOSIS — R498 Other voice and resonance disorders: Secondary | ICD-10-CM

## 2021-06-17 DIAGNOSIS — K591 Functional diarrhea: Secondary | ICD-10-CM

## 2021-06-17 DIAGNOSIS — E069 Thyroiditis, unspecified: Secondary | ICD-10-CM

## 2021-06-17 DIAGNOSIS — M25512 Pain in left shoulder: Secondary | ICD-10-CM | POA: Diagnosis not present

## 2021-06-17 DIAGNOSIS — Z4789 Encounter for other orthopedic aftercare: Secondary | ICD-10-CM | POA: Diagnosis not present

## 2021-06-17 DIAGNOSIS — Z23 Encounter for immunization: Secondary | ICD-10-CM | POA: Diagnosis not present

## 2021-06-17 NOTE — Patient Instructions (Addendum)
Good to see you today- I will be in touch with your thyroid ultrasound asap We will also get you in to see ENT/ DR Pollyann Kennedy.  I suspect he may want to do a scope and look directly at your vocal cords  You can try some imodium as needed for diarrhea Also watch out for dairy- perhaps ask for your food to be prepared without dairy or try an OTC lactaid tablet if you have to consume dairy   Flu shot today

## 2021-06-18 ENCOUNTER — Encounter: Payer: Self-pay | Admitting: Family Medicine

## 2021-06-18 ENCOUNTER — Ambulatory Visit (HOSPITAL_BASED_OUTPATIENT_CLINIC_OR_DEPARTMENT_OTHER)
Admission: RE | Admit: 2021-06-18 | Discharge: 2021-06-18 | Disposition: A | Discharge status: Home / Self Care | Payer: BC Managed Care – PPO | Source: Ambulatory Visit | Attending: Family Medicine | Admitting: Family Medicine

## 2021-06-18 DIAGNOSIS — R499 Unspecified voice and resonance disorder: Secondary | ICD-10-CM | POA: Diagnosis not present

## 2021-06-18 DIAGNOSIS — E069 Thyroiditis, unspecified: Secondary | ICD-10-CM

## 2021-06-19 ENCOUNTER — Other Ambulatory Visit: Payer: Self-pay | Admitting: Family Medicine

## 2021-06-19 ENCOUNTER — Ambulatory Visit (HOSPITAL_BASED_OUTPATIENT_CLINIC_OR_DEPARTMENT_OTHER): Payer: BC Managed Care – PPO

## 2021-06-19 DIAGNOSIS — F419 Anxiety disorder, unspecified: Secondary | ICD-10-CM

## 2021-06-30 ENCOUNTER — Encounter: Payer: Self-pay | Admitting: Family Medicine

## 2021-06-30 DIAGNOSIS — U071 COVID-19: Secondary | ICD-10-CM

## 2021-06-30 MED ORDER — HYDROCODONE BIT-HOMATROP MBR 5-1.5 MG/5ML PO SOLN: 5.0000 mL | Freq: Three times a day (TID) | ORAL | 0 refills | Status: AC | PRN

## 2021-06-30 MED ORDER — NIRMATRELVIR/RITONAVIR (PAXLOVID)TABLET: 3.0000 | ORAL_TABLET | Freq: Two times a day (BID) | ORAL | 0 refills | Status: AC

## 2021-06-30 NOTE — Addendum Note (Signed)
Addended by: Pearline Cables on: 06/30/2021 06:31 PM   Modules accepted: Orders

## 2021-06-30 NOTE — Addendum Note (Signed)
Addended by: Abbe Amsterdam C on: 06/30/2021 09:48 AM   Modules accepted: Orders

## 2021-07-08 ENCOUNTER — Encounter: Payer: Self-pay | Admitting: Family Medicine

## 2021-07-09 ENCOUNTER — Encounter: Payer: Self-pay | Admitting: Family Medicine

## 2021-07-09 DIAGNOSIS — U099 Post covid-19 condition, unspecified: Secondary | ICD-10-CM | POA: Diagnosis not present

## 2021-07-09 DIAGNOSIS — J019 Acute sinusitis, unspecified: Secondary | ICD-10-CM | POA: Diagnosis not present

## 2021-07-14 DIAGNOSIS — M25512 Pain in left shoulder: Secondary | ICD-10-CM | POA: Diagnosis not present

## 2021-07-17 ENCOUNTER — Encounter: Payer: Self-pay | Admitting: Family Medicine

## 2021-07-17 MED ORDER — FLUCONAZOLE 150 MG PO TABS: 150.0000 mg | ORAL_TABLET | Freq: Once | ORAL | 0 refills | Status: AC

## 2021-07-20 DIAGNOSIS — M25512 Pain in left shoulder: Secondary | ICD-10-CM | POA: Diagnosis not present

## 2021-08-04 DIAGNOSIS — R498 Other voice and resonance disorders: Secondary | ICD-10-CM | POA: Diagnosis not present

## 2021-08-04 DIAGNOSIS — K219 Gastro-esophageal reflux disease without esophagitis: Secondary | ICD-10-CM | POA: Diagnosis not present

## 2021-10-20 DIAGNOSIS — M4716 Other spondylosis with myelopathy, lumbar region: Secondary | ICD-10-CM | POA: Diagnosis not present

## 2021-10-20 DIAGNOSIS — M545 Low back pain, unspecified: Secondary | ICD-10-CM | POA: Diagnosis not present

## 2021-10-20 DIAGNOSIS — M5106 Intervertebral disc disorders with myelopathy, lumbar region: Secondary | ICD-10-CM | POA: Diagnosis not present

## 2021-10-23 ENCOUNTER — Encounter: Payer: Self-pay | Admitting: Internal Medicine

## 2021-10-23 ENCOUNTER — Other Ambulatory Visit: Payer: Self-pay

## 2021-10-23 ENCOUNTER — Ambulatory Visit (INDEPENDENT_AMBULATORY_CARE_PROVIDER_SITE_OTHER): Payer: BC Managed Care – PPO | Admitting: Internal Medicine

## 2021-10-23 VITALS — BP 116/80 | HR 64 | Temp 98.1°F | Ht 62.0 in | Wt 119.6 lb

## 2021-10-23 DIAGNOSIS — M545 Low back pain, unspecified: Secondary | ICD-10-CM | POA: Diagnosis not present

## 2021-10-23 DIAGNOSIS — R109 Unspecified abdominal pain: Secondary | ICD-10-CM | POA: Diagnosis not present

## 2021-10-23 DIAGNOSIS — R350 Frequency of micturition: Secondary | ICD-10-CM

## 2021-10-23 LAB — CBC WITH DIFFERENTIAL/PLATELET
Basophils Absolute: 0 10*3/uL (ref 0.0–0.1)
Basophils Relative: 0.6 % (ref 0.0–3.0)
Eosinophils Absolute: 0 10*3/uL (ref 0.0–0.7)
Eosinophils Relative: 0 % (ref 0.0–5.0)
HCT: 41.7 % (ref 36.0–46.0)
Hemoglobin: 13.8 g/dL (ref 12.0–15.0)
Lymphocytes Relative: 10.6 % — ABNORMAL LOW (ref 12.0–46.0)
Lymphs Abs: 0.8 10*3/uL (ref 0.7–4.0)
MCHC: 33 g/dL (ref 30.0–36.0)
MCV: 89.8 fl (ref 78.0–100.0)
Monocytes Absolute: 0.3 10*3/uL (ref 0.1–1.0)
Monocytes Relative: 4.2 % (ref 3.0–12.0)
Neutro Abs: 6.4 10*3/uL (ref 1.4–7.7)
Neutrophils Relative %: 84.6 % — ABNORMAL HIGH (ref 43.0–77.0)
Platelets: 319 10*3/uL (ref 150.0–400.0)
RBC: 4.65 Mil/uL (ref 3.87–5.11)
RDW: 13.8 % (ref 11.5–15.5)
WBC: 7.6 10*3/uL (ref 4.0–10.5)

## 2021-10-23 LAB — COMPREHENSIVE METABOLIC PANEL
ALT: 12 U/L (ref 0–35)
AST: 23 U/L (ref 0–37)
Albumin: 4.5 g/dL (ref 3.5–5.2)
Alkaline Phosphatase: 74 U/L (ref 39–117)
BUN: 16 mg/dL (ref 6–23)
CO2: 30 mEq/L (ref 19–32)
Calcium: 9.7 mg/dL (ref 8.4–10.5)
Chloride: 101 mEq/L (ref 96–112)
Creatinine, Ser: 0.81 mg/dL (ref 0.40–1.20)
GFR: 83.04 mL/min (ref >60.00)
Glucose, Bld: 99 mg/dL (ref 70–99)
Potassium: 5 mEq/L (ref 3.5–5.1)
Sodium: 137 mEq/L (ref 135–145)
Total Bilirubin: 0.4 mg/dL (ref 0.2–1.2)
Total Protein: 7.3 g/dL (ref 6.0–8.3)

## 2021-10-23 LAB — URINALYSIS, ROUTINE W REFLEX MICROSCOPIC
Bilirubin Urine: NEGATIVE
Hgb urine dipstick: NEGATIVE
Ketones, ur: NEGATIVE
Leukocytes,Ua: NEGATIVE
Nitrite: NEGATIVE
Specific Gravity, Urine: <=1.005 — AB (ref 1.000–1.030)
Total Protein, Urine: NEGATIVE
Urine Glucose: NEGATIVE
Urobilinogen, UA: 0.2 (ref 0.0–1.0)
WBC, UA: NONE SEEN (ref 0–?)
pH: 5.5 (ref 5.0–8.0)

## 2021-10-23 NOTE — Progress Notes (Signed)
Subjective:    Patient ID: Melanie Taylor, female    DOB: 1966-03-11, 56 y.o.   MRN: [id number]  This visit occurred during the SARS-CoV-2 public health emergency.  Safety protocols were in place, including screening questions prior to the visit, additional usage of staff PPE, and extensive cleaning of exam room while observing appropriate contact time as indicated for disinfecting solutions.    HPI The patient is here for an acute visit.  She has had some back pain and glut pain - thought it was related to exercise -- been going on for weeks.  She had not done anything different so did not make a lot of sense and it was acting a little different.  [facility] because she has been there before- xrays were ok.  The person she is thought maybe there was some inflammation and gave her an injection and prescribed prednisone.  She did not start taking the prednisone until today because it was not getting better.    For the past 3 weeks or so she has been having intermittent lower abdominal cramping.  Reminds her of menstrual cramping.  She is menopausal.  The past couple of days it has been worse and it seems to be getting worse.  The back doctor discussed with her that something from the abdomen could radiate to the back.  She has not noticed any changes in bowel habits.  She may have a little bit of increase in urination, but no other obvious urine symptoms.  Symptoms are very unusual for her and she is concerned about why she is having them and wanted to evaluate further.   Medications and allergies reviewed with patient and updated if appropriate.  Patient Active Problem List   Diagnosis Date Noted   IBS (irritable bowel syndrome) 01/02/2016   Atypical chest pain 06/30/2015   Hyperthyroidism 05/24/2011   Fatigue 05/24/2011   Anxiety 05/24/2011   Decreased libido 05/24/2011   GERD 04/10/2008   ABDOMINAL PAIN-EPIGASTRIC 04/10/2008    Current Outpatient  Medications on File Prior to Visit  Medication Sig Dispense Refill   ALPRAZolam (XANAX) 0.5 MG tablet TAKE 1 TABLET BY MOUTH THREE TIMES A DAY AS NEEDED FOR ANXIETY OR SLEEP 60 tablet 2   b complex vitamins tablet Take 1 tablet by mouth daily.     levocetirizine (XYZAL) 5 MG tablet TAKE 1 TABLET BY MOUTH EVERY DAY IN THE EVENING 90 tablet 1   levothyroxine (SYNTHROID, LEVOTHROID) 25 MCG tablet 75 mcg daily before breakfast.   1   liothyronine (CYTOMEL) 5 MCG tablet Take 5 mcg by mouth daily.     omeprazole (PRILOSEC) 20 MG capsule TAKE 1 CAPSULE BY MOUTH EVERY DAY 90 capsule 3   valACYclovir (VALTREX) 500 MG tablet TAKE 4 TABLETS (2GM) ONCE, REPEAT IN 12 HOURS AS NEEDED FOR COLD SORE 30 tablet 3   methocarbamol (ROBAXIN) 500 MG tablet Take 500 mg by mouth 4 (four) times daily.     predniSONE (STERAPRED UNI-PAK 21 TAB) 5 MG (21) TBPK tablet Take by mouth.     Current Facility-Administered Medications on File Prior to Visit  Medication Dose Route Frequency Provider Last Rate Last Admin   0.9 %  sodium chloride infusion  500 mL Intravenous Once Gatha Mayer, MD        Past Medical History:  Diagnosis Date   Allergy    Arthritis    GERD (gastroesophageal reflux disease)    IBS (  irritable bowel syndrome)    Thyroid disease    hyperthyroidism    Past Surgical History:  Procedure Laterality Date   AUGMENTATION MAMMAPLASTY  11/2004   CESAREAN SECTION  06/2003   one time   COLONOSCOPY     over 10 years ago with Dr Earlean Shawl   KNEE SURGERY  2003   left   SHOULDER SURGERY  2010   right shoulder    Social History   Socioeconomic History   Marital status: Married    Spouse name: Not on file   Number of children: Not on file   Years of education: Not on file   Highest education level: Not on file  Occupational History    Employer: [employer]  Tobacco Use   Smoking status: Never   Smokeless tobacco: Never  Vaping Use   Vaping Use: Never used  Substance and Sexual  Activity   Alcohol use: Yes    Comment: socially   Drug use: No   Sexual activity: Not on file  Other Topics Concern   Not on file  Social History Narrative   Married Engineer, site   Social Determinants of Health   Financial Resource Strain: Not on file  Food Insecurity: Not on file  Transportation Needs: Not on file  Physical Activity: Not on file  Stress: Not on file  Social Connections: Not on file    Family History  Problem Relation Age of Onset   Alcohol abuse Father    Heart attack Father    Sudden death Father    Diabetes Father    Heart disease Father    Arthritis Mother    Hypothyroidism Mother    Hypothyroidism Maternal Grandmother    Cancer Maternal Grandfather        COLON   Colon cancer Maternal Grandfather    Diabetes Other        GRANDFATHER   Esophageal cancer Neg Hx    Rectal cancer Neg Hx    Stomach cancer Neg Hx     Review of Systems  Constitutional:  Negative for chills and fever.  Gastrointestinal:  Positive for abdominal pain (lower abdomen - more right side - sporadic). Negative for blood in stool, constipation, diarrhea and nausea.       Gerd controlled.   BM normal  Genitourinary:  Positive for frequency (mild). Negative for dysuria, hematuria, urgency, vaginal bleeding and vaginal discharge.  Neurological:  Positive for headaches (occ - not new). Negative for light-headedness.      Objective:   Vitals:   10/23/21 1528  BP: 116/80  Pulse: 64  Temp: 98.1 F (36.7 C)  SpO2: 99%   BP Readings from Last 3 Encounters:  10/23/21 116/80  06/17/21 110/62  05/06/21 108/68   Wt Readings from Last 3 Encounters:  10/23/21 119 lb 9.6 oz (54.3 kg)  06/17/21 116 lb (52.6 kg)  05/06/21 117 lb 12.8 oz (53.4 kg)   Body mass index is 21.88 kg/m.   Physical Exam Constitutional:      General: She is not in acute distress.    Appearance: She is well-developed. She is not ill-appearing.  HENT:     Head: Normocephalic and  atraumatic.  Abdominal:     General: Abdomen is flat.     Palpations: Abdomen is soft.     Tenderness: There is no abdominal tenderness. There is no right CVA tenderness, left CVA tenderness, guarding or rebound. Negative signs include Murphy's sign and McBurney's sign.  Hernia: No hernia is present.  Musculoskeletal:     Comments: Tenderness with palpation central lower back and right lower back-nonfocal  Skin:    General: Skin is warm and dry.     Findings: No erythema.  Neurological:     Mental Status: She is alert.           Assessment & Plan:    Lower back pain: Central lower back-right lower back Started weeks ago No obvious injury and has needed to adjust her activities [facility] and x-rays were normal They did not have a good explanation for pain and thought maybe it was related to some irritation/inflammation Received an injection there in the office and was prescribed prednisone, which he just started taking today Back pain is different from what she is experienced in the past and does not make a lot of sense To complete prednisone taper  Lower abdominal cramping, urinary frequency:  For the past 3 weeks she has been experiencing lower abdominal cramping Feels like menstrual cramps, but she is menopausal No change in bowels Slight increase in urination-maybe No pattern to the cramping She wondered if this was causing some of the lower back pain and what potential because there was Cramping is increasing in intensity and frequency in the past couple of days 1 more severe No pain right now Unfortunately is difficult to tell what is causing her lower abdominal cramping and if it is related to the lower back pain or not-given the duration of symptoms and the progressive nature I think we do need further evaluation Check CBC, CMP, UA, urine culture CT abdomen and pelvis-to be done as soon as possible-probably next week

## 2021-10-23 NOTE — Patient Instructions (Addendum)
° ° °  Blood work and urine tests were ordered.     Medications changes include :   none    A Ct scan was ordered.       Someone from their office will call you to schedule an appointment.

## 2021-10-24 LAB — URINE CULTURE: Result:: NO GROWTH

## 2021-10-26 ENCOUNTER — Inpatient Hospital Stay: Admission: RE | Admit: 2021-10-26 | Payer: BC Managed Care – PPO | Source: Ambulatory Visit

## 2021-10-28 ENCOUNTER — Ambulatory Visit (INDEPENDENT_AMBULATORY_CARE_PROVIDER_SITE_OTHER)
Admission: RE | Admit: 2021-10-28 | Discharge: 2021-10-28 | Disposition: A | Discharge status: Home / Self Care | Payer: BC Managed Care – PPO | Source: Ambulatory Visit | Attending: Internal Medicine | Admitting: Internal Medicine

## 2021-10-28 ENCOUNTER — Encounter: Payer: Self-pay | Admitting: Internal Medicine

## 2021-10-28 ENCOUNTER — Other Ambulatory Visit: Payer: Self-pay

## 2021-10-28 DIAGNOSIS — R350 Frequency of micturition: Secondary | ICD-10-CM

## 2021-10-28 DIAGNOSIS — R109 Unspecified abdominal pain: Secondary | ICD-10-CM

## 2021-10-28 MED ORDER — IOHEXOL 300 MG/ML  SOLN
100.0000 mL | Freq: Once | INTRAMUSCULAR | Status: AC | PRN
  Administered 2021-10-28: 100 mL via INTRAVENOUS

## 2021-11-09 ENCOUNTER — Inpatient Hospital Stay: Admission: RE | Admit: 2021-11-09 | Payer: BC Managed Care – PPO | Source: Ambulatory Visit

## 2021-11-18 ENCOUNTER — Ambulatory Visit (INDEPENDENT_AMBULATORY_CARE_PROVIDER_SITE_OTHER): Payer: BC Managed Care – PPO | Admitting: Family Medicine

## 2021-11-18 ENCOUNTER — Encounter: Payer: Self-pay | Admitting: Family Medicine

## 2021-11-18 VITALS — BP 102/60 | HR 54 | Temp 98.2°F | Ht 62.0 in | Wt 119.1 lb

## 2021-11-18 DIAGNOSIS — L243 Irritant contact dermatitis due to cosmetics: Secondary | ICD-10-CM

## 2021-11-18 MED ORDER — TRIAMCINOLONE ACETONIDE 0.1 % EX CREA: 1.0000 "application " | TOPICAL_CREAM | Freq: Two times a day (BID) | CUTANEOUS | 0 refills | Status: DC

## 2021-11-18 NOTE — Progress Notes (Signed)
Chief Complaint  Patient presents with   Allergic Reaction    Melanie Taylor is a 56 y.o. female here for a skin complaint.  Duration: 1 week Location: arms and now spreading legs Pruritic? Yes Painful? Yes- burning Drainage? No New soaps/lotions/topicals/detergents? Yes; new lotion around 1 mo ago Sick contacts? No Other associated symptoms: no fevers, diarrhea, sob, swelling Therapies tried thus far: Aveeno, OTC cortisone cream  Past Medical History:  Diagnosis Date   Allergy    Arthritis    GERD (gastroesophageal reflux disease)    IBS (irritable bowel syndrome)    Thyroid disease    hyperthyroidism    BP 102/60    Pulse (!) 54    Temp 98.2 F (36.8 C) (Oral)    Ht 5\' 2"  (1.575 m)    Wt 119 lb 2 oz (54 kg)    LMP 08/26/2016    SpO2 94%    BMI 21.79 kg/m  Gen: awake, alert, appearing stated age Lungs: No accessory muscle use Skin: erythematous patches over anterior arms b/l with blanching and some excoriation. No drainage, TTP, fluctuance, excessive warmth Psych: Age appropriate judgment and insight  Irritant contact dermatitis due to cosmetics - Plan: triamcinolone cream (KENALOG) 0.1 %  Orders as above. Bid use for 7-10 d. Try not to itch or use scented products. Stop using the new lotion.  F/u prn. The patient voiced understanding and agreement to the plan.  08/28/2016 [place], DO 11/18/21 11:30 AM

## 2021-11-18 NOTE — Patient Instructions (Signed)
Try not to scratch as this can make things worse. Avoid scented products while dealing with this. You may resume when the itchiness resolves. Cold/cool compresses can help.   Use the cream for 7-10 days.   OK to continue Cetaphil.   Let us know if you need anything.

## 2021-11-29 ENCOUNTER — Other Ambulatory Visit: Payer: Self-pay | Admitting: Family Medicine

## 2021-11-29 DIAGNOSIS — F419 Anxiety disorder, unspecified: Secondary | ICD-10-CM

## 2022-01-28 DIAGNOSIS — M25851 Other specified joint disorders, right hip: Secondary | ICD-10-CM | POA: Diagnosis not present

## 2022-02-02 DIAGNOSIS — M1611 Unilateral primary osteoarthritis, right hip: Secondary | ICD-10-CM | POA: Diagnosis not present

## 2022-02-25 DIAGNOSIS — M25851 Other specified joint disorders, right hip: Secondary | ICD-10-CM | POA: Diagnosis not present

## 2022-03-07 DIAGNOSIS — R102 Pelvic and perineal pain: Secondary | ICD-10-CM | POA: Diagnosis not present

## 2022-03-11 DIAGNOSIS — M5431 Sciatica, right side: Secondary | ICD-10-CM | POA: Diagnosis not present

## 2022-03-11 DIAGNOSIS — M25851 Other specified joint disorders, right hip: Secondary | ICD-10-CM | POA: Diagnosis not present

## 2022-03-18 DIAGNOSIS — M5416 Radiculopathy, lumbar region: Secondary | ICD-10-CM | POA: Diagnosis not present

## 2022-03-25 DIAGNOSIS — M5416 Radiculopathy, lumbar region: Secondary | ICD-10-CM | POA: Diagnosis not present

## 2022-04-07 DIAGNOSIS — M5416 Radiculopathy, lumbar region: Secondary | ICD-10-CM | POA: Diagnosis not present

## 2022-04-15 DIAGNOSIS — M5416 Radiculopathy, lumbar region: Secondary | ICD-10-CM | POA: Diagnosis not present

## 2022-04-19 DIAGNOSIS — M5416 Radiculopathy, lumbar region: Secondary | ICD-10-CM | POA: Diagnosis not present

## 2022-05-09 DIAGNOSIS — R7303 Prediabetes: Secondary | ICD-10-CM | POA: Insufficient documentation

## 2022-05-09 NOTE — Progress Notes (Deleted)
[facility] at [facility] [address] [place], [place] [address] [phone]  Date:  05/12/2022   Name:  Melanie Taylor   DOB:  09-Apr-1966   MRN:  [id number]  PCP:  Pearline Cables, MD    Chief Complaint: No chief complaint on file.   History of Present Illness:  Melanie Taylor is a 56 y.o. very pleasant female patient who presents with the following:  Patient seen today for physical exam-history of hypothyroidism, IBS, prediabetes Most recent visit with myself was in September-at that time she was having difficulty with chronic voice fatigue, she was seen by ENT/Dr. Pollyann Kennedy.  All appeared reassuring  Mammogram Pap is up-to-date Colonoscopy up-to-date Can update lab work today Shingrix is complete Patient Active Problem List   Diagnosis Date Noted   IBS (irritable bowel syndrome) 01/02/2016   Atypical chest pain 06/30/2015   Hyperthyroidism 05/24/2011   Fatigue 05/24/2011   Anxiety 05/24/2011   Decreased libido 05/24/2011   GERD 04/10/2008   ABDOMINAL PAIN-EPIGASTRIC 04/10/2008    Past Medical History:  Diagnosis Date   Allergy    Arthritis    GERD (gastroesophageal reflux disease)    IBS (irritable bowel syndrome)    Thyroid disease    hyperthyroidism    Past Surgical History:  Procedure Laterality Date   AUGMENTATION MAMMAPLASTY  11/2004   CESAREAN SECTION  06/2003   one time   COLONOSCOPY     over 10 years ago with Dr Kinnie Scales   KNEE SURGERY  2003   left   SHOULDER SURGERY  2010   right shoulder    Social History   Tobacco Use   Smoking status: Never   Smokeless tobacco: Never  Vaping Use   Vaping Use: Never used  Substance Use Topics   Alcohol use: Yes    Comment: socially   Drug use: No    Family History  Problem Relation Age of Onset   Alcohol abuse Father    Heart attack Father    Sudden death Father    Diabetes Father    Heart disease Father    Arthritis Mother    Hypothyroidism  Mother    Hypothyroidism Maternal Grandmother    Cancer Maternal Grandfather        COLON   Colon cancer Maternal Grandfather    Diabetes Other        GRANDFATHER   Esophageal cancer Neg Hx    Rectal cancer Neg Hx    Stomach cancer Neg Hx     No Known Allergies  Medication list has been reviewed and updated.  Current Outpatient Medications on File Prior to Visit  Medication Sig Dispense Refill   ALPRAZolam (XANAX) 0.5 MG tablet TAKE 1 TABLET BY MOUTH THREE TIMES A DAY AS NEEDED FOR ANXIETY OR SLEEP 60 tablet 2   b complex vitamins tablet Take 1 tablet by mouth daily.     levocetirizine (XYZAL) 5 MG tablet TAKE 1 TABLET BY MOUTH EVERY DAY IN THE EVENING 90 tablet 1   levothyroxine (SYNTHROID, LEVOTHROID) 25 MCG tablet 75 mcg daily before breakfast.   1   liothyronine (CYTOMEL) 5 MCG tablet Take 5 mcg by mouth daily.     omeprazole (PRILOSEC) 20 MG capsule TAKE 1 CAPSULE BY MOUTH EVERY DAY 90 capsule 3   triamcinolone cream (KENALOG) 0.1 % Apply 1 application topically 2 (two) times daily. 60 g 0   valACYclovir (VALTREX) 500 MG tablet  TAKE 4 TABLETS (2GM) ONCE, REPEAT IN 12 HOURS AS NEEDED FOR COLD SORE 30 tablet 3   Current Facility-Administered Medications on File Prior to Visit  Medication Dose Route Frequency Provider Last Rate Last Admin   0.9 %  sodium chloride infusion  500 mL Intravenous Once Iva Boop, MD        Review of Systems: As per HPI- otherwise negative.    Physical Examination: There were no vitals filed for this visit. There were no vitals filed for this visit. There is no height or weight on file to calculate BMI. Ideal Body Weight:    GEN: no acute distress. HEENT: Atraumatic, Normocephalic.  Ears and Nose: No external deformity. CV: RRR, No M/G/R. No JVD. No thrill. No extra heart sounds. PULM: CTA B, no wheezes, crackles, rhonchi. No retractions. No resp. distress. No accessory muscle use. ABD: S, NT, ND, +BS. No rebound. No HSM. EXTR: No  c/c/e PSYCH: Normally interactive. Conversant.    Assessment and Plan: *** Physical exam today.  Encouraged healthy diet and exercise routine Will plan further follow- up pending labs.  Signed Abbe Amsterdam, MD

## 2022-05-12 ENCOUNTER — Encounter: Payer: Self-pay | Admitting: Family Medicine

## 2022-05-12 ENCOUNTER — Encounter: Payer: BC Managed Care – PPO | Admitting: Family Medicine

## 2022-05-12 DIAGNOSIS — Z13220 Encounter for screening for lipoid disorders: Secondary | ICD-10-CM

## 2022-05-12 DIAGNOSIS — Z130 Encounter for screening for diseases of the blood and blood-forming organs and certain disorders involving the immune mechanism: Secondary | ICD-10-CM

## 2022-05-12 DIAGNOSIS — R7303 Prediabetes: Secondary | ICD-10-CM

## 2022-05-12 DIAGNOSIS — E0590 Thyrotoxicosis, unspecified without thyrotoxic crisis or storm: Secondary | ICD-10-CM

## 2022-05-12 DIAGNOSIS — Z0000 Encounter for general adult medical examination without abnormal findings: Secondary | ICD-10-CM

## 2022-05-12 DIAGNOSIS — R5383 Other fatigue: Secondary | ICD-10-CM

## 2022-05-18 DIAGNOSIS — L578 Other skin changes due to chronic exposure to nonionizing radiation: Secondary | ICD-10-CM | POA: Diagnosis not present

## 2022-05-18 DIAGNOSIS — D1801 Hemangioma of skin and subcutaneous tissue: Secondary | ICD-10-CM | POA: Diagnosis not present

## 2022-05-18 DIAGNOSIS — L821 Other seborrheic keratosis: Secondary | ICD-10-CM | POA: Diagnosis not present

## 2022-06-01 NOTE — Progress Notes (Addendum)
[facility] at [facility] [address], [address] [place], [place] [address] [phone]  Date:  06/03/2022   Name:  Melanie Taylor   DOB:  [birth year]   MRN:  [id number]  PCP:  Pearline Cables, MD    Chief Complaint: Annual Exam   History of Present Illness:  Melanie Taylor is a 56 y.o. very pleasant female patient who presents with the following:  Pt seen today for CPE Last visit with myself about one year ago when she had concern of voice change.  This seems about back to normal  History of IBS, hyperthyroidism, prediabetes, GERD, anxiety She is going to PT for her back  She is staying at her mom's home right now to help care for her since her mom fell. This is the 2nd time this year she has had to move in with her mom to provide care- she is not sure how long she can keep this up Her brother lives nearby - however he is not all that helpful Her daughter just started college at [employer] in [place]  No tobacco Regular exercise   Levothyroxine Cytopmel Xanax prn- she uses this to help herself sleep and for severe stress  Prilosec  Mammo -this is due, ordered  Pap is UTD- last year negative  Colon 2020 Patient Active Problem List   Diagnosis Date Noted   Prediabetes 05/09/2022   IBS (irritable bowel syndrome) 01/02/2016   Hyperthyroidism 05/24/2011   Anxiety 05/24/2011   GERD 04/10/2008    Past Medical History:  Diagnosis Date   Allergy    Arthritis    GERD (gastroesophageal reflux disease)    IBS (irritable bowel syndrome)    Thyroid disease    hyperthyroidism    Past Surgical History:  Procedure Laterality Date   AUGMENTATION MAMMAPLASTY  11/2004   CESAREAN SECTION  06/2003   one time   COLONOSCOPY     over 10 years ago with Dr Kinnie Scales   KNEE SURGERY  2003   left   SHOULDER SURGERY  2010   right shoulder    Social History   Tobacco Use   Smoking status: Never   Smokeless tobacco: Never  Vaping Use    Vaping Use: Never used  Substance Use Topics   Alcohol use: Yes    Comment: socially   Drug use: No    Family History  Problem Relation Age of Onset   Alcohol abuse Father    Heart attack Father    Sudden death Father    Diabetes Father    Heart disease Father    Arthritis Mother    Hypothyroidism Mother    Hypothyroidism Maternal Grandmother    Cancer Maternal Grandfather        COLON   Colon cancer Maternal Grandfather    Diabetes Other        GRANDFATHER   Esophageal cancer Neg Hx    Rectal cancer Neg Hx    Stomach cancer Neg Hx     No Known Allergies  Medication list has been reviewed and updated.  Current Outpatient Medications on File Prior to Visit  Medication Sig Dispense Refill   ALPRAZolam (XANAX) 0.5 MG tablet TAKE 1 TABLET BY MOUTH THREE TIMES A DAY AS NEEDED FOR ANXIETY OR SLEEP 60 tablet 2   b complex vitamins tablet Take 1 tablet by mouth daily.     levocetirizine (XYZAL) 5 MG tablet TAKE 1 TABLET BY MOUTH  EVERY DAY IN THE EVENING 90 tablet 1   levothyroxine (SYNTHROID, LEVOTHROID) 25 MCG tablet 75 mcg daily before breakfast.   1   liothyronine (CYTOMEL) 5 MCG tablet Take 5 mcg by mouth daily.     Multiple Vitamin (MULTIVITAMIN) capsule Take 1 capsule by mouth daily.     omeprazole (PRILOSEC) 20 MG capsule TAKE 1 CAPSULE BY MOUTH EVERY DAY 90 capsule 3   POTASSIUM CHLORIDE PO Take by mouth.     valACYclovir (VALTREX) 500 MG tablet TAKE 4 TABLETS (2GM) ONCE, REPEAT IN 12 HOURS AS NEEDED FOR COLD SORE 30 tablet 3   VITAMIN D PO Take by mouth.     ZINC OXIDE PO Take by mouth.     Current Facility-Administered Medications on File Prior to Visit  Medication Dose Route Frequency Provider Last Rate Last Admin   0.9 %  sodium chloride infusion  500 mL Intravenous Once Iva Boop, MD        Review of Systems:  As per HPI- otherwise negative.   Physical Examination: Vitals:   06/03/22 1359  BP: 110/60  Pulse: (!) 56  Resp: 16  Temp: 97.8 F (36.6  C)  SpO2: 94%   Vitals:   06/03/22 1359  Weight: 119 lb 3.2 oz (54.1 kg)  Height: 5\' 2"  (1.575 m)   Body mass index is 21.8 kg/m. Ideal Body Weight: Weight in (lb) to have BMI = 25: 136.4  GEN: no acute distress. Normal weight, looks well  HEENT: Atraumatic, Normocephalic.  Bilateral TM wnl, oropharynx normal.  PEERL,EOMI.   Ears and Nose: No external deformity. CV: RRR, No M/G/R. No JVD. No thrill. No extra heart sounds. PULM: CTA B, no wheezes, crackles, rhonchi. No retractions. No resp. distress. No accessory muscle use. ABD: S, NT, ND, +BS. No rebound. No HSM. EXTR: No c/c/e PSYCH: Normally interactive. Conversant.    Assessment and Plan: Physical exam  Pre-diabetes - Plan: Comprehensive metabolic panel, Hemoglobin A1c  Hyperthyroidism - Plan: TSH  Screening for deficiency anemia - Plan: CBC  Screening for hyperlipidemia - Plan: Lipid panel  Anxiety - Plan: ALPRAZolam (XANAX) 0.5 MG tablet  Encounter for screening mammogram for malignant neoplasm of breast - Plan: MS DIGITAL SCREENING TOMO W/IMPLANTS BILAT  Physical exam today- encouraged healthy diet and exercise routine  Will plan further follow- up pending labs. Ordered mammo Recommend covid and flu this fall   Signed , MD Addendum 8/25 Received her labs as below, message to patient  Results for orders placed or performed in visit on 06/03/22  CBC  Result Value Ref Range   WBC 6.3 4.0 - 10.5 K/uL   RBC 4.69 3.87 - 5.11 Mil/uL   Platelets 321.0 150.0 - 400.0 K/uL   Hemoglobin 14.2 12.0 - 15.0 g/dL   HCT 06/05/22 36.1 - 44.3 %   MCV 91.2 78.0 - 100.0 fl   MCHC 33.3 30.0 - 36.0 g/dL   RDW 15.4 00.8 - 67.6 %  Comprehensive metabolic panel  Result Value Ref Range   Sodium 138 135 - 145 mEq/L   Potassium 5.1 3.5 - 5.1 mEq/L   Chloride 98 96 - 112 mEq/L   CO2 31 19 - 32 mEq/L   Glucose, Bld 110 (H) 70 - 99 mg/dL   BUN 15 6 - 23 mg/dL   Creatinine, Ser 19.5 0.40 - 1.20 mg/dL   Total  Bilirubin 0.5 0.2 - 1.2 mg/dL   Alkaline Phosphatase 91 39 - 117 U/L   AST 22  0 - 37 U/L   ALT 14 0 - 35 U/L   Total Protein 6.9 6.0 - 8.3 g/dL   Albumin 4.5 3.5 - 5.2 g/dL   GFR 60.45 >40.98 mL/min   Calcium 10.1 8.4 - 10.5 mg/dL  Hemoglobin J1B  Result Value Ref Range   Hgb A1c MFr Bld 6.0 4.6 - 6.5 %  Lipid panel  Result Value Ref Range   Cholesterol 218 (H) 0 - 200 mg/dL   Triglycerides 14.7 0.0 - 149.0 mg/dL   HDL 82.95 >62.13 mg/dL   VLDL 08.6 0.0 - 57.8 mg/dL   LDL Cholesterol 469 (H) 0 - 99 mg/dL   Total CHOL/HDL Ratio 3    NonHDL 137.57   TSH  Result Value Ref Range   TSH 1.36 0.35 - 5.50 uIU/mL

## 2022-06-01 NOTE — Patient Instructions (Signed)
Good to see you today!  I will be in touch with your labs Flu shot, covid booster this fall if indicated Take care!

## 2022-06-03 ENCOUNTER — Ambulatory Visit (INDEPENDENT_AMBULATORY_CARE_PROVIDER_SITE_OTHER): Payer: BC Managed Care – PPO | Admitting: Family Medicine

## 2022-06-03 VITALS — BP 110/60 | HR 56 | Temp 97.8°F | Resp 16 | Ht 62.0 in | Wt 119.2 lb

## 2022-06-03 DIAGNOSIS — E0590 Thyrotoxicosis, unspecified without thyrotoxic crisis or storm: Secondary | ICD-10-CM | POA: Diagnosis not present

## 2022-06-03 DIAGNOSIS — Z130 Encounter for screening for diseases of the blood and blood-forming organs and certain disorders involving the immune mechanism: Secondary | ICD-10-CM | POA: Diagnosis not present

## 2022-06-03 DIAGNOSIS — Z13220 Encounter for screening for lipoid disorders: Secondary | ICD-10-CM

## 2022-06-03 DIAGNOSIS — F419 Anxiety disorder, unspecified: Secondary | ICD-10-CM

## 2022-06-03 DIAGNOSIS — Z0000 Encounter for general adult medical examination without abnormal findings: Secondary | ICD-10-CM

## 2022-06-03 DIAGNOSIS — Z1231 Encounter for screening mammogram for malignant neoplasm of breast: Secondary | ICD-10-CM

## 2022-06-03 DIAGNOSIS — R7303 Prediabetes: Secondary | ICD-10-CM | POA: Diagnosis not present

## 2022-06-03 MED ORDER — ALPRAZOLAM 0.5 MG PO TABS: ORAL_TABLET | ORAL | 2 refills | Status: DC

## 2022-06-04 ENCOUNTER — Encounter: Payer: Self-pay | Admitting: Family Medicine

## 2022-06-04 LAB — LIPID PANEL
Cholesterol: 218 mg/dL — ABNORMAL HIGH (ref 0–200)
HDL: 80.1 mg/dL (ref >39.00)
LDL Cholesterol: 119 mg/dL — ABNORMAL HIGH (ref 0–99)
NonHDL: 137.57
Total CHOL/HDL Ratio: 3
Triglycerides: 94 mg/dL (ref 0.0–149.0)
VLDL: 18.8 mg/dL (ref 0.0–40.0)

## 2022-06-04 LAB — CBC
HCT: 42.8 % (ref 36.0–46.0)
Hemoglobin: 14.2 g/dL (ref 12.0–15.0)
MCHC: 33.3 g/dL (ref 30.0–36.0)
MCV: 91.2 fl (ref 78.0–100.0)
Platelets: 321 10*3/uL (ref 150.0–400.0)
RBC: 4.69 Mil/uL (ref 3.87–5.11)
RDW: 14.4 % (ref 11.5–15.5)
WBC: 6.3 10*3/uL (ref 4.0–10.5)

## 2022-06-04 LAB — HEMOGLOBIN A1C: Hgb A1c MFr Bld: 6 % (ref 4.6–6.5)

## 2022-06-04 LAB — COMPREHENSIVE METABOLIC PANEL
ALT: 14 U/L (ref 0–35)
AST: 22 U/L (ref 0–37)
Albumin: 4.5 g/dL (ref 3.5–5.2)
Alkaline Phosphatase: 91 U/L (ref 39–117)
BUN: 15 mg/dL (ref 6–23)
CO2: 31 mEq/L (ref 19–32)
Calcium: 10.1 mg/dL (ref 8.4–10.5)
Chloride: 98 mEq/L (ref 96–112)
Creatinine, Ser: 0.99 mg/dL (ref 0.40–1.20)
GFR: 64.98 mL/min (ref >60.00)
Glucose, Bld: 110 mg/dL — ABNORMAL HIGH (ref 70–99)
Potassium: 5.1 mEq/L (ref 3.5–5.1)
Sodium: 138 mEq/L (ref 135–145)
Total Bilirubin: 0.5 mg/dL (ref 0.2–1.2)
Total Protein: 6.9 g/dL (ref 6.0–8.3)

## 2022-06-04 LAB — TSH: TSH: 1.36 u[IU]/mL (ref 0.35–5.50)

## 2022-06-25 ENCOUNTER — Ambulatory Visit: Payer: BC Managed Care – PPO

## 2022-07-12 ENCOUNTER — Encounter: Payer: Self-pay | Admitting: Family Medicine

## 2022-07-12 ENCOUNTER — Ambulatory Visit (INDEPENDENT_AMBULATORY_CARE_PROVIDER_SITE_OTHER): Payer: BC Managed Care – PPO | Admitting: Family Medicine

## 2022-07-12 VITALS — BP 112/70 | HR 77 | Temp 97.9°F | Resp 18 | Ht 62.0 in | Wt 120.2 lb

## 2022-07-12 DIAGNOSIS — Z23 Encounter for immunization: Secondary | ICD-10-CM | POA: Diagnosis not present

## 2022-07-12 DIAGNOSIS — R1013 Epigastric pain: Secondary | ICD-10-CM

## 2022-07-12 LAB — POC URINALSYSI DIPSTICK (AUTOMATED)
Bilirubin, UA: NEGATIVE
Blood, UA: NEGATIVE
Glucose, UA: NEGATIVE
Ketones, UA: NEGATIVE
Leukocytes, UA: NEGATIVE
Nitrite, UA: NEGATIVE
Protein, UA: NEGATIVE
Spec Grav, UA: 1.01 (ref 1.010–1.025)
Urobilinogen, UA: 0.2 E.U./dL
pH, UA: 5 (ref 5.0–8.0)

## 2022-07-12 MED ORDER — PANTOPRAZOLE SODIUM 40 MG PO TBEC: 40.0000 mg | DELAYED_RELEASE_TABLET | Freq: Every day | ORAL | 3 refills | Status: DC

## 2022-07-12 NOTE — Progress Notes (Signed)
Subjective:   By signing my name below, I, Melanie Taylor, attest that this documentation has been prepared under the direction and in the presence of Melanie Taylor 07/12/2022    Patient ID: Melanie Taylor, female    DOB: [birth year], 56 y.o.   MRN: [id number]  Chief Complaint  Patient presents with   Abdominal Pain    X1 week, abd pain, bloating, no appetite, some diarrhea 3-4 days, yellow stools, increased thirst and burping.     HPI Patient is in today for an office visit.  She complains of stomach symptoms that occurred about a week ago. Had periods of the night where she woke up with feeling of lava in her esophagus. She also had periods of diarrhea. As of today's visit, she is feeling bloated, abnormal gas, and burping. She is also feeling tired but she believes that could be contributed to the lack of eating. Her stools are also yellow. She is not wanting to eat and when she does, she feels extremely full. She is drinking water but still feels thirsty. She notes that she has been having extreme familial and work stress. She also notes of left abdominal pain but symptoms are improving. Her last endoscopy was in 04/26/2008. She is currently taking Prevacid and Pepcid PRN. She has also tried Concha Pyo but symptoms are persistent  She is interested in receiving an influenza vaccine during today's visit  Past Medical History:  Diagnosis Date   Allergy    Arthritis    GERD (gastroesophageal reflux disease)    IBS (irritable bowel syndrome)    Thyroid disease    hyperthyroidism    Past Surgical History:  Procedure Laterality Date   AUGMENTATION MAMMAPLASTY  11/2004   CESAREAN SECTION  06/2003   one time   COLONOSCOPY     over 10 years ago with Dr Kinnie Scales   KNEE SURGERY  2003   left   SHOULDER SURGERY  2010   right shoulder    Family History  Problem Relation Age of Onset   Alcohol abuse Father    Heart attack Father    Sudden death Father    Diabetes Father     Heart disease Father    Arthritis Mother    Hypothyroidism Mother    Hypothyroidism Maternal Grandmother    Cancer Maternal Grandfather        COLON   Colon cancer Maternal Grandfather    Diabetes Other        GRANDFATHER   Esophageal cancer Neg Hx    Rectal cancer Neg Hx    Stomach cancer Neg Hx     Social History   Socioeconomic History   Marital status: Married    Spouse name: Not on file   Number of children: Not on file   Years of education: Not on file   Highest education level: Not on file  Occupational History    Employer: [employer]  Tobacco Use   Smoking status: Never   Smokeless tobacco: Never  Vaping Use   Vaping Use: Never used  Substance and Sexual Activity   Alcohol use: Yes    Comment: socially   Drug use: No   Sexual activity: Not on file  Other Topics Concern   Not on file  Social History Narrative   Married Engineer, production   Social Determinants of Health   Financial Resource Strain: Not on file  Food Insecurity: Not on file  Transportation Needs: Not  on file  Physical Activity: Not on file  Stress: Not on file  Social Connections: Not on file  Intimate Partner Violence: Not on file    Outpatient Medications Prior to Visit  Medication Sig Dispense Refill   ALPRAZolam (XANAX) 0.5 MG tablet TAKE 1 TABLET BY MOUTH THREE TIMES A DAY AS NEEDED FOR ANXIETY OR SLEEP 60 tablet 2   b complex vitamins tablet Take 1 tablet by mouth daily.     levocetirizine (XYZAL) 5 MG tablet TAKE 1 TABLET BY MOUTH EVERY DAY IN THE EVENING 90 tablet 1   levothyroxine (SYNTHROID, LEVOTHROID) 25 MCG tablet 75 mcg daily before breakfast.   1   liothyronine (CYTOMEL) 5 MCG tablet Take 5 mcg by mouth daily.     Multiple Vitamin (MULTIVITAMIN) capsule Take 1 capsule by mouth daily.     POTASSIUM CHLORIDE PO Take by mouth.     valACYclovir (VALTREX) 500 MG tablet TAKE 4 TABLETS (2GM) ONCE, REPEAT IN 12 HOURS AS NEEDED FOR COLD SORE 30 tablet 3    VITAMIN D PO Take by mouth.     ZINC OXIDE PO Take by mouth.     omeprazole (PRILOSEC) 20 MG capsule TAKE 1 CAPSULE BY MOUTH EVERY DAY 90 capsule 3   Facility-Administered Medications Prior to Visit  Medication Dose Route Frequency Provider Last Rate Last Admin   0.9 %  sodium chloride infusion  500 mL Intravenous Once Iva Boop, MD        No Known Allergies  Review of Systems  Gastrointestinal:        (+) Bloating (+) Gas (+) Burping (+) Yellow Stools       Objective:    Physical Exam Constitutional:      General: She is not in acute distress.    Appearance: Normal appearance. She is not ill-appearing.  HENT:     Head: Normocephalic and atraumatic.     Right Ear: External ear normal.     Left Ear: External ear normal.  Eyes:     Extraocular Movements: Extraocular movements intact.     Pupils: Pupils are equal, round, and reactive to light.  Cardiovascular:     Rate and Rhythm: Normal rate and regular rhythm.     Heart sounds: Normal heart sounds. No murmur heard.    No gallop.  Pulmonary:     Effort: Pulmonary effort is normal. No respiratory distress.     Breath sounds: Normal breath sounds. No wheezing or rales.  Abdominal:     General: Bowel sounds are normal. There is no distension.     Palpations: Abdomen is soft.     Tenderness: There is no abdominal tenderness. There is no guarding.  Skin:    General: Skin is warm and dry.  Neurological:     Mental Status: She is alert and oriented to person, place, and time.  Psychiatric:        Judgment: Judgment normal.     BP 112/70 (BP Location: Left Arm, Patient Position: Sitting, Cuff Size: Normal)   Pulse 77   Temp 97.9 F (36.6 C) (Oral)   Resp 18   Ht 5\' 2"  (1.575 m)   Wt 120 lb 3.2 oz (54.5 kg)   LMP 08/26/2016   SpO2 98%   BMI 21.98 kg/m  Wt Readings from Last 3 Encounters:  07/12/22 120 lb 3.2 oz (54.5 kg)  06/03/22 119 lb 3.2 oz (54.1 kg)  11/18/21 119 lb 2 oz (54 kg)    Diabetic  Foot  Exam - Simple   No data filed    Lab Results  Component Value Date   WBC 5.1 07/12/2022   HGB 14.1 07/12/2022   HCT 41.6 07/12/2022   PLT 301.0 07/12/2022   GLUCOSE 78 07/12/2022   CHOL 218 (H) 06/03/2022   TRIG 94.0 06/03/2022   HDL 80.10 06/03/2022   LDLDIRECT 90.9 06/05/2012   LDLCALC 119 (H) 06/03/2022   ALT 15 07/12/2022   AST 26 07/12/2022   NA 141 07/12/2022   K 4.0 07/12/2022   CL 102 07/12/2022   CREATININE 0.96 07/12/2022   BUN 17 07/12/2022   CO2 32 07/12/2022   TSH 1.36 06/03/2022   HGBA1C 6.0 06/03/2022    Lab Results  Component Value Date   TSH 1.36 06/03/2022   Lab Results  Component Value Date   WBC 5.1 07/12/2022   HGB 14.1 07/12/2022   HCT 41.6 07/12/2022   MCV 89.9 07/12/2022   PLT 301.0 07/12/2022   Lab Results  Component Value Date   NA 141 07/12/2022   K 4.0 07/12/2022   CO2 32 07/12/2022   GLUCOSE 78 07/12/2022   BUN 17 07/12/2022   CREATININE 0.96 07/12/2022   BILITOT 0.4 07/12/2022   ALKPHOS 84 07/12/2022   AST 26 07/12/2022   ALT 15 07/12/2022   PROT 6.9 07/12/2022   ALBUMIN 4.4 07/12/2022   CALCIUM 9.1 07/12/2022   ANIONGAP 2 (L) 11/07/2014   GFR 67.38 07/12/2022   Lab Results  Component Value Date   CHOL 218 (H) 06/03/2022   Lab Results  Component Value Date   HDL 80.10 06/03/2022   Lab Results  Component Value Date   LDLCALC 119 (H) 06/03/2022   Lab Results  Component Value Date   TRIG 94.0 06/03/2022   Lab Results  Component Value Date   CHOLHDL 3 06/03/2022   Lab Results  Component Value Date   HGBA1C 6.0 06/03/2022       Assessment & Plan:   Problem List Items Addressed This Visit   None Visit Diagnoses     Dyspepsia    -  Primary   Relevant Medications   pantoprazole (PROTONIX) 40 MG tablet   Other Relevant Orders   CBC with Differential/Platelet (Completed)   Comprehensive metabolic panel (Completed)   POCT Urinalysis Dipstick (Automated) (Completed)   Ambulatory referral to  Gastroenterology   H. pylori antibody, IgG (Completed)   Need for influenza vaccination       Relevant Orders   Flu Vaccine QUAD 50mo+IM (Fluarix, Fluzone & Alfiuria Quad PF) (Completed)      Meds ordered this encounter  Medications   pantoprazole (PROTONIX) 40 MG tablet    Sig: Take 1 tablet (40 mg total) by mouth daily.    Dispense:  30 tablet    Refill:  3    I, Ann Held, Taylor, personally preformed the services described in this documentation.  All medical record entries made by the scribe were at my direction and in my presence.  I have reviewed the chart and discharge instructions (if applicable) and agree that the record reflects my personal performance and is accurate and complete. 07/12/2022   I,Amber Collins,acting as a scribe for Ann Held, Taylor.,have documented all relevant documentation on the behalf of Ann Held, Taylor,as directed by  Ann Held, Taylor while in the presence of Ann Held, Taylor.      Ann Held, Taylor

## 2022-07-12 NOTE — Patient Instructions (Signed)
Food Choices for Gastroesophageal Reflux Disease, Adult When you have gastroesophageal reflux disease (GERD), the foods you eat and your eating habits are very important. Choosing the right foods can help ease the discomfort of GERD. Consider working with a dietitian to help you make healthy food choices. What are tips for following this plan? Reading food labels Look for foods that are low in saturated fat. Foods that have less than 5% of daily value (DV) of fat and 0 g of trans fats may help with your symptoms. Cooking Cook foods using methods other than frying. This may include baking, steaming, grilling, or broiling. These are all methods that do not need a lot of fat for cooking. To add flavor, try to use herbs that are low in spice and acidity. Meal planning  Choose healthy foods that are low in fat, such as fruits, vegetables, whole grains, low-fat dairy products, lean meats, fish, and poultry. Eat frequent, small meals instead of three large meals each day. Eat your meals slowly, in a relaxed setting. Avoid bending over or lying down until 2-3 hours after eating. Limit high-fat foods such as fatty meats or fried foods. Limit your intake of fatty foods, such as oils, butter, and shortening. Avoid the following as told by your health care provider: Foods that cause symptoms. These may be different for different people. Keep a food diary to keep track of foods that cause symptoms. Alcohol. Drinking large amounts of liquid with meals. Eating meals during the 2-3 hours before bed. Lifestyle Maintain a healthy weight. Ask your health care provider what weight is healthy for you. If you need to lose weight, work with your health care provider to do so safely. Exercise for at least 30 minutes on 5 or more days each week, or as told by your health care provider. Avoid wearing clothes that fit tightly around your waist and chest. Do not use any products that contain nicotine or tobacco. These  products include cigarettes, chewing tobacco, and vaping devices, such as e-cigarettes. If you need help quitting, ask your health care provider. Sleep with the head of your bed raised. Use a wedge under the mattress or blocks under the bed frame to raise the head of the bed. Chew sugar-free gum after mealtimes. What foods should I eat?  Eat a healthy, well-balanced diet of fruits, vegetables, whole grains, low-fat dairy products, lean meats, fish, and poultry. Each person is different. Foods that may trigger symptoms in one person may not trigger any symptoms in another person. Work with your health care provider to identify foods that are safe for you. The items listed above may not be a complete list of recommended foods and beverages. Contact a dietitian for more information. What foods should I avoid? Limiting some of these foods may help manage the symptoms of GERD. Everyone is different. Consult a dietitian or your health care provider to help you identify the exact foods to avoid, if any. Fruits Any fruits prepared with added fat. Any fruits that cause symptoms. For some people this may include citrus fruits, such as oranges, grapefruit, pineapple, and lemons. Vegetables Deep-fried vegetables. French fries. Any vegetables prepared with added fat. Any vegetables that cause symptoms. For some people, this may include tomatoes and tomato products, chili peppers, onions and garlic, and horseradish. Grains Pastries or quick breads with added fat. Meats and other proteins High-fat meats, such as fatty beef or pork, hot dogs, ribs, ham, sausage, salami, and bacon. Fried meat or protein, including   fried fish and fried chicken. Nuts and nut butters, in large amounts. Dairy Whole milk and chocolate milk. Sour cream. Cream. Ice cream. Cream cheese. Milkshakes. Fats and oils Butter. Margarine. Shortening. Ghee. Beverages Coffee and tea, with or without caffeine. Carbonated beverages. Sodas. Energy  drinks. Fruit juice made with acidic fruits, such as orange or grapefruit. Tomato juice. Alcoholic drinks. Sweets and desserts Chocolate and cocoa. Donuts. Seasonings and condiments Pepper. Peppermint and spearmint. Added salt. Any condiments, herbs, or seasonings that cause symptoms. For some people, this may include curry, hot sauce, or vinegar-based salad dressings. The items listed above may not be a complete list of foods and beverages to avoid. Contact a dietitian for more information. Questions to ask your health care provider Diet and lifestyle changes are usually the first steps that are taken to manage symptoms of GERD. If diet and lifestyle changes do not improve your symptoms, talk with your health care provider about taking medicines. Where to find more information [employer]: aboutgerd.org Summary When you have gastroesophageal reflux disease (GERD), food and lifestyle choices may be very helpful in easing the discomfort of GERD. Eat frequent, small meals instead of three large meals each day. Eat your meals slowly, in a relaxed setting. Avoid bending over or lying down until 2-3 hours after eating. Limit high-fat foods such as fatty meats or fried foods. This information is not intended to replace advice given to you by your health care provider. Make sure you discuss any questions you have with your health care provider. Document Revised: 04/07/2020 Document Reviewed: 04/07/2020 Elsevier Patient Education  2023 [employer] Inc.  

## 2022-07-13 ENCOUNTER — Encounter: Payer: Self-pay | Admitting: Physician Assistant

## 2022-07-13 DIAGNOSIS — R1013 Epigastric pain: Secondary | ICD-10-CM | POA: Insufficient documentation

## 2022-07-13 LAB — CBC WITH DIFFERENTIAL/PLATELET
Basophils Absolute: 0 10*3/uL (ref 0.0–0.1)
Basophils Relative: 0.7 % (ref 0.0–3.0)
Eosinophils Absolute: 0 10*3/uL (ref 0.0–0.7)
Eosinophils Relative: 0.1 % (ref 0.0–5.0)
HCT: 41.6 % (ref 36.0–46.0)
Hemoglobin: 14.1 g/dL (ref 12.0–15.0)
Lymphocytes Relative: 28.4 % (ref 12.0–46.0)
Lymphs Abs: 1.4 10*3/uL (ref 0.7–4.0)
MCHC: 34 g/dL (ref 30.0–36.0)
MCV: 89.9 fl (ref 78.0–100.0)
Monocytes Absolute: 0.5 10*3/uL (ref 0.1–1.0)
Monocytes Relative: 10.4 % (ref 3.0–12.0)
Neutro Abs: 3.1 10*3/uL (ref 1.4–7.7)
Neutrophils Relative %: 60.4 % (ref 43.0–77.0)
Platelets: 301 10*3/uL (ref 150.0–400.0)
RBC: 4.62 Mil/uL (ref 3.87–5.11)
RDW: 13.6 % (ref 11.5–15.5)
WBC: 5.1 10*3/uL (ref 4.0–10.5)

## 2022-07-13 LAB — COMPREHENSIVE METABOLIC PANEL
ALT: 15 U/L (ref 0–35)
AST: 26 U/L (ref 0–37)
Albumin: 4.4 g/dL (ref 3.5–5.2)
Alkaline Phosphatase: 84 U/L (ref 39–117)
BUN: 17 mg/dL (ref 6–23)
CO2: 32 mEq/L (ref 19–32)
Calcium: 9.1 mg/dL (ref 8.4–10.5)
Chloride: 102 mEq/L (ref 96–112)
Creatinine, Ser: 0.96 mg/dL (ref 0.40–1.20)
GFR: 67.38 mL/min (ref >60.00)
Glucose, Bld: 78 mg/dL (ref 70–99)
Potassium: 4 mEq/L (ref 3.5–5.1)
Sodium: 141 mEq/L (ref 135–145)
Total Bilirubin: 0.4 mg/dL (ref 0.2–1.2)
Total Protein: 6.9 g/dL (ref 6.0–8.3)

## 2022-07-13 LAB — H. PYLORI ANTIBODY, IGG: H Pylori IgG: NEGATIVE

## 2022-07-13 NOTE — Assessment & Plan Note (Signed)
protonix qd

## 2022-08-13 ENCOUNTER — Ambulatory Visit: Payer: BC Managed Care – PPO | Admitting: Physician Assistant

## 2022-09-17 ENCOUNTER — Encounter: Payer: Self-pay | Admitting: Nurse Practitioner

## 2022-09-17 ENCOUNTER — Ambulatory Visit (INDEPENDENT_AMBULATORY_CARE_PROVIDER_SITE_OTHER): Payer: BC Managed Care – PPO | Admitting: Nurse Practitioner

## 2022-09-17 VITALS — BP 118/78 | HR 70 | Ht 62.0 in | Wt 119.2 lb

## 2022-09-17 DIAGNOSIS — K219 Gastro-esophageal reflux disease without esophagitis: Secondary | ICD-10-CM

## 2022-09-17 NOTE — Patient Instructions (Addendum)
Continue Prevacid (Lansoprazole) 30 mg one capsule by mouth once daily to be taken 30 minutes before breakfast  Pepcid AC one tab as needed  Contact our office if your reflux symptoms worsen   Your next screening colonoscopy is due September 2030

## 2022-09-17 NOTE — Progress Notes (Signed)
09/17/2022 Melanie Taylor [id number] [birth year]   CHIEF COMPLAINT: Acid reflux  HISTORY OF PRESENT ILLNESS: Melanie Taylor is a 56 year old female with a past medical history of thyroid disease, IBS symptoms and GERD.  She presents today as referred by Dr. Seabron Spates for further follow-up regarding acid reflux.  She takes Prevacid 15 mg 1 p.o. daily OTC for several years.  She developed nighttime acid reflux symptoms, stomach discomfort with bloat and a decreased appetite early October 2023.  At one point, she felt as if she had hot lava in her throat.  No specific food triggers.  She was seen by her PCP who prescribed Pantoprazole 40 mg daily but she did not start it. She started taking Pepcid Complete as needed and her reflux symptoms have significantly improved.  Her stress level has recently been elevated after her daughter left for college and her elderly mother had two health events.  She also owns her own business.  She is starting to feel much better over the past few weeks.  She feels her reflux symptoms are well-controlled at this point.  No dysphagia.  She is passing normal formed brown bowel movement most days.  She has urgent diarrhea sometimes when she eats out.  She takes Aleve for aches and pains approximately 7 days monthly.  She underwent a colonoscopy by Dr. Leone Payor 06/21/2019 which was normal.  Maternal grandfather had colon cancer.  She underwent an EGD 04/26/2008 which showed fundic gland polyps otherwise was unremarkable.     Latest Ref Rng & Units 07/12/2022    4:14 PM 06/03/2022    2:17 PM 10/23/2021    4:05 PM  CBC  WBC 4.0 - 10.5 K/uL 5.1  6.3  7.6   Hemoglobin 12.0 - 15.0 g/dL 78.2  95.6  21.3   Hematocrit 36.0 - 46.0 % 41.6  42.8  41.7   Platelets 150.0 - 400.0 K/uL 301.0  321.0  319.0        Latest Ref Rng & Units 07/12/2022    4:14 PM 06/03/2022    2:17 PM 10/23/2021    4:05 PM  CMP  Glucose 70 - 99 mg/dL 78  086  99   BUN 6 - 23 mg/dL 17  15  16     Creatinine 0.40 - 1.20 mg/dL  5.78  4.69   Sodium 135 - 145 mEq/L 141  138  137   Potassium 3.5 - 5.1 mEq/L 4.0  5.1  5.0   Chloride 96 - 112 mEq/L 102  98  101   CO2 19 - 32 mEq/L 32  31  30   Calcium 8.4 - 10.5 mg/dL 9.1  6.29  9.7   Total Protein 6.0 - 8.3 g/dL 6.9  6.9  7.3   Total Bilirubin 0.2 - 1.2 mg/dL 0.4  0.5  0.4   Alkaline Phos 39 - 117 U/L 84  91  74   AST 0 - 37 U/L 26  22  23    ALT 0 - 35 U/L 15  14  12      Past Medical History:  Diagnosis Date   Allergy    Arthritis    GERD (gastroesophageal reflux disease)    IBS (irritable bowel syndrome)    Thyroid disease    hyperthyroidism   Past Surgical History:  Procedure Laterality Date   AUGMENTATION MAMMAPLASTY  11/2004   CESAREAN SECTION  06/2003   one time   COLONOSCOPY  over 10 years ago with Dr Kinnie Scales   KNEE SURGERY  2003   left   SHOULDER SURGERY  2010   right shoulder   Social History: She is married.  She has 1 daughter.  She is a Psychologist, sport and exercise.  Non-smoker.  No alcohol use.  No drug use.  Family History: family history includes Alcohol abuse in her father; Arthritis in her mother; Cancer in her maternal grandfather; Colon cancer in her maternal grandfather; Diabetes in her father and another family member; Heart attack in her father; Heart disease in her father; Hypothyroidism in her maternal grandmother and mother; Sudden death in her father.  No Known Allergies    Outpatient Encounter Medications as of 09/17/2022  Medication Sig   ALPRAZolam (XANAX) 0.5 MG tablet TAKE 1 TABLET BY MOUTH THREE TIMES A DAY AS NEEDED FOR ANXIETY OR SLEEP   b complex vitamins tablet Take 1 tablet by mouth daily.   levocetirizine (XYZAL) 5 MG tablet TAKE 1 TABLET BY MOUTH EVERY DAY IN THE EVENING   levothyroxine (SYNTHROID, LEVOTHROID) 25 MCG tablet 75 mcg daily before breakfast.    Multiple Vitamin (MULTIVITAMIN) capsule Take 1 capsule by mouth daily.   POTASSIUM CHLORIDE PO Take by mouth.   selenium 50 MCG  TABS tablet Take 50 mcg by mouth daily.   valACYclovir (VALTREX) 500 MG tablet TAKE 4 TABLETS (2GM) ONCE, REPEAT IN 12 HOURS AS NEEDED FOR COLD SORE   VITAMIN D PO Take by mouth.   liothyronine (CYTOMEL) 5 MCG tablet Take 5 mcg by mouth daily.   pantoprazole (PROTONIX) 40 MG tablet Take 1 tablet (40 mg total) by mouth daily. (Patient not taking: Reported on 09/17/2022)   ZINC OXIDE PO Take by mouth. (Patient not taking: Reported on 09/17/2022)   Facility-Administered Encounter Medications as of 09/17/2022  Medication   0.9 %  sodium chloride infusion    REVIEW OF SYSTEMS:  Gen: + Night sweats. No weight loss.  CV: Denies chest pain, palpitations or edema. Resp: Denies cough, shortness of breath of hemoptysis.  GI: See HPI.   GU : Denies urinary burning, blood in urine, increased urinary frequency or incontinence. MS: + Back pain. Derm: Denies rash, itchiness, skin lesions or unhealing ulcers. Psych: Denies depression, anxiety or memory loss. Heme: Denies bruising, easy bleeding. Neuro:  Denies headaches, dizziness or paresthesias. Endo:  Denies any problems with DM, thyroid or adrenal function.  PHYSICAL EXAM: BP 118/78   Pulse 70   Ht 5\' 2"  (1.575 m)   Wt 119 lb 3.2 oz (54.1 kg)   LMP 08/26/2016   SpO2 98%   BMI 21.80 kg/m  General: 56 year old female in no acute distress. Head: Normocephalic and atraumatic. Eyes:  Sclerae non-icteric, conjunctive pink. Ears: Normal auditory acuity. Mouth: Dentition intact. No ulcers or lesions.  Neck: Supple, no lymphadenopathy or thyromegaly.  Lungs: Clear bilaterally to auscultation without wheezes, crackles or rhonchi. Heart: Regular rate and rhythm. No murmur, rub or gallop appreciated.  Abdomen: Soft, nontender, non distended. No masses. No hepatosplenomegaly. Normoactive bowel sounds x 4 quadrants.  Rectal: Deferred. Musculoskeletal: Symmetrical with no gross deformities. Skin: Warm and dry. No rash or lesions on visible  extremities. Extremities: No edema. Neurological: Alert oriented x 4, no focal deficits.  Psychological:  Alert and cooperative. Normal mood and affect.  ASSESSMENT AND PLAN:  81) 56 year old female with a history of GERD with increased reflux symptoms 07/2022 in the setting of increased stress which improved after she consistently took Prevacid 15 mg  OTC and added Pepcid Complete as needed. -Continue Prevacid 50 mg OTC and Pepcid Complete 1 tab nightly as needed -GERD diet -Limit NSAID use -I discussed scheduling an EGD if reflux symptoms worsen  2) Colon cancer screening.  Normal colonoscopy 06/21/2019. -Next screening colonoscopy due 06/2029  3) History of IBS, diarrhea when she eats out -Pepto-Bismol as needed -Patient to contact office if symptoms worsen    CC:  Copland, Gwenlyn Found, MD

## 2022-11-04 DIAGNOSIS — E790 Hyperuricemia without signs of inflammatory arthritis and tophaceous disease: Secondary | ICD-10-CM | POA: Diagnosis not present

## 2022-11-04 DIAGNOSIS — N951 Menopausal and female climacteric states: Secondary | ICD-10-CM | POA: Diagnosis not present

## 2022-11-04 DIAGNOSIS — M357 Hypermobility syndrome: Secondary | ICD-10-CM | POA: Diagnosis not present

## 2022-11-04 DIAGNOSIS — E039 Hypothyroidism, unspecified: Secondary | ICD-10-CM | POA: Diagnosis not present

## 2022-11-04 DIAGNOSIS — R5383 Other fatigue: Secondary | ICD-10-CM | POA: Diagnosis not present

## 2022-11-04 DIAGNOSIS — E509 Vitamin A deficiency, unspecified: Secondary | ICD-10-CM | POA: Diagnosis not present

## 2022-11-04 DIAGNOSIS — E7212 Methylenetetrahydrofolate reductase deficiency: Secondary | ICD-10-CM | POA: Diagnosis not present

## 2022-11-04 DIAGNOSIS — E063 Autoimmune thyroiditis: Secondary | ICD-10-CM | POA: Diagnosis not present

## 2022-11-04 DIAGNOSIS — E559 Vitamin D deficiency, unspecified: Secondary | ICD-10-CM | POA: Diagnosis not present

## 2022-11-04 DIAGNOSIS — E782 Mixed hyperlipidemia: Secondary | ICD-10-CM | POA: Diagnosis not present

## 2022-11-10 ENCOUNTER — Encounter (HOSPITAL_BASED_OUTPATIENT_CLINIC_OR_DEPARTMENT_OTHER): Payer: Self-pay | Admitting: Emergency Medicine

## 2022-11-10 ENCOUNTER — Emergency Department (HOSPITAL_BASED_OUTPATIENT_CLINIC_OR_DEPARTMENT_OTHER)
Admission: EM | Admit: 2022-11-10 | Discharge: 2022-11-10 | Disposition: A | Discharge status: Home / Self Care | Payer: BC Managed Care – PPO | Attending: Emergency Medicine | Admitting: Emergency Medicine

## 2022-11-10 ENCOUNTER — Other Ambulatory Visit: Payer: Self-pay

## 2022-11-10 DIAGNOSIS — R1110 Vomiting, unspecified: Secondary | ICD-10-CM | POA: Insufficient documentation

## 2022-11-10 DIAGNOSIS — R197 Diarrhea, unspecified: Secondary | ICD-10-CM | POA: Insufficient documentation

## 2022-11-10 DIAGNOSIS — R9431 Abnormal electrocardiogram [ECG] [EKG]: Secondary | ICD-10-CM | POA: Diagnosis not present

## 2022-11-10 LAB — COMPREHENSIVE METABOLIC PANEL
ALT: 14 U/L (ref 0–44)
AST: 24 U/L (ref 15–41)
Albumin: 4.1 g/dL (ref 3.5–5.0)
Alkaline Phosphatase: 84 U/L (ref 38–126)
Anion gap: 8 (ref 5–15)
BUN: 18 mg/dL (ref 6–20)
CO2: 29 mmol/L (ref 22–32)
Calcium: 8.9 mg/dL (ref 8.9–10.3)
Chloride: 102 mmol/L (ref 98–111)
Creatinine, Ser: 0.95 mg/dL (ref 0.44–1.00)
GFR, Estimated: >60 mL/min (ref >60)
Glucose, Bld: 118 mg/dL — ABNORMAL HIGH (ref 70–99)
Potassium: 3.9 mmol/L (ref 3.5–5.1)
Sodium: 139 mmol/L (ref 135–145)
Total Bilirubin: 0.9 mg/dL (ref 0.3–1.2)
Total Protein: 7.2 g/dL (ref 6.5–8.1)

## 2022-11-10 LAB — CBC WITH DIFFERENTIAL/PLATELET
Abs Immature Granulocytes: 0.04 10*3/uL (ref 0.00–0.07)
Basophils Absolute: 0 10*3/uL (ref 0.0–0.1)
Basophils Relative: 0 %
Eosinophils Absolute: 0 10*3/uL (ref 0.0–0.5)
Eosinophils Relative: 0 %
HCT: 40.8 % (ref 36.0–46.0)
Hemoglobin: 14.2 g/dL (ref 12.0–15.0)
Immature Granulocytes: 0 %
Lymphocytes Relative: 3 %
Lymphs Abs: 0.4 10*3/uL — ABNORMAL LOW (ref 0.7–4.0)
MCH: 30 pg (ref 26.0–34.0)
MCHC: 34.8 g/dL (ref 30.0–36.0)
MCV: 86.3 fL (ref 80.0–100.0)
Monocytes Absolute: 0.6 10*3/uL (ref 0.1–1.0)
Monocytes Relative: 5 %
Neutro Abs: 10.8 10*3/uL — ABNORMAL HIGH (ref 1.7–7.7)
Neutrophils Relative %: 92 %
Platelets: 281 10*3/uL (ref 150–400)
RBC: 4.73 MIL/uL (ref 3.87–5.11)
RDW: 13.3 % (ref 11.5–15.5)
WBC: 11.9 10*3/uL — ABNORMAL HIGH (ref 4.0–10.5)
nRBC: 0 % (ref 0.0–0.2)

## 2022-11-10 LAB — LIPASE, BLOOD: Lipase: 31 U/L (ref 11–51)

## 2022-11-10 MED ORDER — ONDANSETRON 4 MG PO TBDP: ORAL_TABLET | ORAL | 0 refills | Status: DC

## 2022-11-10 MED ORDER — LACTATED RINGERS IV BOLUS
1000.0000 mL | Freq: Once | INTRAVENOUS | Status: AC
  Administered 2022-11-10: 1000 mL via INTRAVENOUS

## 2022-11-10 MED ORDER — ONDANSETRON HCL 4 MG/2ML IJ SOLN
4.0000 mg | Freq: Once | INTRAMUSCULAR | Status: AC
  Administered 2022-11-10: 4 mg via INTRAVENOUS
  Filled 2022-11-10: qty 2

## 2022-11-10 NOTE — ED Triage Notes (Signed)
Pt states last night her stomach was bothering her and around 11pm  she started having vomiting and diarrhea

## 2022-11-10 NOTE — ED Provider Notes (Signed)
Emergency Department Provider Note  I have reviewed the triage vital signs and the nursing notes.  HISTORY  Chief Complaint Emesis (Diarrhea )   HPI Melanie Taylor is a 57 y.o. female with without significant past medical history who presents the ER today secondary to vomiting diarrhea.  She states her daughter was sick with something similar recently she last saw her yesterday but it attempted to try not to make any contact.  She states that she woke up tonight with multiple nonbloody nonbilious emesis and nonbloody bowel movements that were diarrhea.  No fevers.  No abnormal food intake.  Little bit of central abdominal cramping but nothing persistent.  No other associated symptoms.  PMH Past Medical History:  Diagnosis Date   Allergy    Arthritis    GERD (gastroesophageal reflux disease)    IBS (irritable bowel syndrome)    Thyroid disease    hyperthyroidism    Home Medications Prior to Admission medications   Medication Sig Start Date End Date Taking? Authorizing Provider  ALPRAZolam (XANAX) 0.5 MG tablet TAKE 1 TABLET BY MOUTH THREE TIMES A DAY AS NEEDED FOR ANXIETY OR SLEEP 06/03/22   Copland, Gay Filler, MD  b complex vitamins tablet Take 1 tablet by mouth daily.    [provider]  famotidine-calcium carbonate-magnesium hydroxide (PEPCID COMPLETE) 10-800-165 MG chewable tablet Chew 1 tablet by mouth daily as needed.    [provider]  lansoprazole (PREVACID) 15 MG capsule Take 15 mg by mouth daily at 12 noon.    [provider]  levocetirizine (XYZAL) 5 MG tablet TAKE 1 TABLET BY MOUTH EVERY DAY IN THE EVENING 06/23/20   Mosie Lukes, MD  levothyroxine (SYNTHROID, LEVOTHROID) 25 MCG tablet 75 mcg daily before breakfast.  10/26/16   [provider]  liothyronine (CYTOMEL) 5 MCG tablet Take 5 mcg by mouth daily.    [provider]  Multiple Vitamin (MULTIVITAMIN) capsule Take 1 capsule by mouth daily.    [provider]   pantoprazole (PROTONIX) 40 MG tablet Take 1 tablet (40 mg total) by mouth daily. Patient not taking: Reported on 09/17/2022 07/12/22   Roma Schanz R, DO  POTASSIUM CHLORIDE PO Take by mouth.    [provider]  selenium 50 MCG TABS tablet Take 50 mcg by mouth daily.    [provider]  valACYclovir (VALTREX) 500 MG tablet TAKE 4 TABLETS (2GM) ONCE, REPEAT IN 12 HOURS AS NEEDED FOR COLD SORE 03/25/21   Copland, Gay Filler, MD  VITAMIN D PO Take by mouth.    [provider]  ZINC OXIDE PO Take by mouth. Patient not taking: Reported on 09/17/2022    [provider]    Social History Social History   Tobacco Use   Smoking status: Never   Smokeless tobacco: Never  Vaping Use   Vaping Use: Never used  Substance Use Topics   Alcohol use: Yes    Comment: socially   Drug use: No    Review of Systems: Documented in HPI ____________________________________________  PHYSICAL EXAM: VITAL SIGNS: ED Triage Vitals  Enc Vitals Group     BP 11/10/22 0216 122/80     Pulse Rate 11/10/22 0216 84     Resp 11/10/22 0216 14     Temp 11/10/22 0216 97.9 F (36.6 C)     Temp Source 11/10/22 0216 Oral     SpO2 11/10/22 0216 99 %     Weight 11/10/22 0213 118 lb (53.5  kg)     Height 11/10/22 0213 5\' 2"  (1.575 m)     Head Circumference --      Peak Flow --      Pain Score 11/10/22 0213 6     Pain Loc --      Pain Edu? --      Excl. in [place]? --    Physical Exam Vitals and nursing note reviewed.  Constitutional:      Appearance: She is well-developed.  HENT:     Head: Normocephalic and atraumatic.     Mouth/Throat:     Mouth: Mucous membranes are dry.  Eyes:     Pupils: Pupils are equal, round, and reactive to light.  Cardiovascular:     Rate and Rhythm: Normal rate and regular rhythm.  Pulmonary:     Effort: No respiratory distress.     Breath sounds: No stridor.  Abdominal:     General: Abdomen is flat. There is no distension.  Musculoskeletal:         General: No swelling or tenderness. Normal range of motion.     Cervical back: Normal range of motion.  Skin:    General: Skin is warm and dry.  Neurological:     General: No focal deficit present.     Mental Status: She is alert.       ____________________________________________   LABS (all labs ordered are listed, but only abnormal results are displayed)  Labs Reviewed  CBC WITH DIFFERENTIAL/PLATELET - Abnormal; Notable for the following components:      Result Value   WBC 11.9 (*)    Neutro Abs 10.8 (*)    Lymphs Abs 0.4 (*)    All other components within normal limits  COMPREHENSIVE METABOLIC PANEL - Abnormal; Notable for the following components:   Glucose, Bld 118 (*)    All other components within normal limits  LIPASE, BLOOD   ____________________________________________  EKG   EKG Interpretation  Date/Time:    Ventricular Rate:    PR Interval:    QRS Duration:   QT Interval:    QTC Calculation:   R Axis:     Text Interpretation:          ____________________________________________  RADIOLOGY  No results found. ____________________________________________  PROCEDURES  Procedure(s) performed:   Procedures ____________________________________________  INITIAL IMPRESSION / ASSESSMENT AND PLAN   This patient presents to the ED for concern of vomiting and diarrhea with abdominal cramping, this involves an extensive number of treatment options, and is a complaint that carries with it a high risk of complications and morbidity.  The differential diagnosis includes gastroenteritis, biliary disease, gastritis/peptic ulcer disease, bowel obstruction.-Most likely gastroenteritis secondary to daughter being sick recently and is relatively benign abdomen and no fever.  Will check labs.  Additional history obtained:  Additional history obtained from no one Previous records obtained and reviewed in epic  Co morbidities that complicate the  patient evaluation  N/A  Social Determinants of Health:  Sick contact  ED Course  Images ordered viewed and obtained by myself. Agree with Radiology interpretation. Details in ED course.  Labs ordered reviewed by myself as detailed in ED course.  Consultations obtained/considered detailed in ED course.        Cardiac Monitoring:  The patient was maintained on a cardiac monitor.  I personally viewed and interpreted the cardiac monitored which showed an underlying rhythm of: sinus, rate 70  CRITICAL INTERVENTIONS:  Fluids Anti-emetics  Reevaluation:  After the interventions noted  above, I reevaluated the patient and found that they have :improved  Suspect gastroenteritis as the cause for her symptoms. No indication for imaging. Tolerating PO. Discussed good hand hygiene and reasons to return to the ed vs pcp follow up.  FINAL IMPRESSION AND PLAN Final diagnoses:  None    A medical screening exam was performed and I feel the patient has had an appropriate workup for their chief complaint at this time and likelihood of emergent condition existing is low. They have been counseled on decision, DISCHARGE, follow up and which symptoms necessitate immediate return to the emergency department. They or their family verbally stated understanding and agreement with plan and discharged in stable condition.   ____________________________________________   NEW OUTPATIENT MEDICATIONS STARTED DURING THIS VISIT:  New Prescriptions   No medications on file    Note:  This note was prepared with assistance of Dragon voice recognition software. Occasional wrong-word or sound-a-like substitutions may have occurred due to the inherent limitations of voice recognition software.    Rashaun Curl, Corene Cornea, MD 11/10/22 [phone]

## 2022-11-19 ENCOUNTER — Encounter: Payer: Self-pay | Admitting: Family Medicine

## 2022-12-08 ENCOUNTER — Ambulatory Visit
Admission: RE | Admit: 2022-12-08 | Discharge: 2022-12-08 | Disposition: A | Discharge status: Home / Self Care | Payer: BC Managed Care – PPO | Source: Ambulatory Visit | Attending: Family Medicine | Admitting: Family Medicine

## 2022-12-08 DIAGNOSIS — Z1231 Encounter for screening mammogram for malignant neoplasm of breast: Secondary | ICD-10-CM | POA: Diagnosis not present

## 2022-12-09 ENCOUNTER — Other Ambulatory Visit: Payer: Self-pay | Admitting: Family Medicine

## 2022-12-09 DIAGNOSIS — F419 Anxiety disorder, unspecified: Secondary | ICD-10-CM

## 2023-01-02 ENCOUNTER — Other Ambulatory Visit: Payer: Self-pay | Admitting: Family Medicine

## 2023-01-02 DIAGNOSIS — B001 Herpesviral vesicular dermatitis: Secondary | ICD-10-CM

## 2023-01-03 ENCOUNTER — Encounter: Payer: Self-pay | Admitting: *Deleted

## 2023-01-04 ENCOUNTER — Other Ambulatory Visit: Payer: Self-pay | Admitting: Family Medicine

## 2023-01-04 DIAGNOSIS — F419 Anxiety disorder, unspecified: Secondary | ICD-10-CM

## 2023-01-05 NOTE — Telephone Encounter (Signed)
Requesting: alprazolam 0.5mg  Contract: No UDS: no Last Visit: 06/03/2022 Next Visit: Visit date not found Last Refill: 12/09/22  Please Advise

## 2023-02-08 DIAGNOSIS — M7711 Lateral epicondylitis, right elbow: Secondary | ICD-10-CM | POA: Diagnosis not present

## 2023-03-12 ENCOUNTER — Other Ambulatory Visit: Payer: Self-pay | Admitting: Family Medicine

## 2023-03-12 DIAGNOSIS — F419 Anxiety disorder, unspecified: Secondary | ICD-10-CM

## 2023-03-12 DIAGNOSIS — B001 Herpesviral vesicular dermatitis: Secondary | ICD-10-CM

## 2023-03-14 NOTE — Telephone Encounter (Signed)
Last RF--#60 on 01/05/2023 Last OV with PCP---06/03/2022 No future appointment scheduled.

## 2023-04-05 DIAGNOSIS — M545 Low back pain, unspecified: Secondary | ICD-10-CM | POA: Diagnosis not present

## 2023-04-05 DIAGNOSIS — M25851 Other specified joint disorders, right hip: Secondary | ICD-10-CM | POA: Diagnosis not present

## 2023-04-22 DIAGNOSIS — N951 Menopausal and female climacteric states: Secondary | ICD-10-CM | POA: Diagnosis not present

## 2023-04-22 DIAGNOSIS — R799 Abnormal finding of blood chemistry, unspecified: Secondary | ICD-10-CM | POA: Diagnosis not present

## 2023-04-22 DIAGNOSIS — E782 Mixed hyperlipidemia: Secondary | ICD-10-CM | POA: Diagnosis not present

## 2023-04-22 DIAGNOSIS — D6489 Other specified anemias: Secondary | ICD-10-CM | POA: Diagnosis not present

## 2023-04-22 DIAGNOSIS — E039 Hypothyroidism, unspecified: Secondary | ICD-10-CM | POA: Diagnosis not present

## 2023-04-22 DIAGNOSIS — R7301 Impaired fasting glucose: Secondary | ICD-10-CM | POA: Diagnosis not present

## 2023-04-22 DIAGNOSIS — E559 Vitamin D deficiency, unspecified: Secondary | ICD-10-CM | POA: Diagnosis not present

## 2023-04-22 DIAGNOSIS — E7841 Elevated Lipoprotein(a): Secondary | ICD-10-CM | POA: Diagnosis not present

## 2023-04-22 DIAGNOSIS — E063 Autoimmune thyroiditis: Secondary | ICD-10-CM | POA: Diagnosis not present

## 2023-04-22 DIAGNOSIS — R0789 Other chest pain: Secondary | ICD-10-CM | POA: Diagnosis not present

## 2023-04-22 DIAGNOSIS — R5383 Other fatigue: Secondary | ICD-10-CM | POA: Diagnosis not present

## 2023-04-25 ENCOUNTER — Encounter: Payer: Self-pay | Admitting: Family Medicine

## 2023-04-25 DIAGNOSIS — E2839 Other primary ovarian failure: Secondary | ICD-10-CM

## 2023-04-25 DIAGNOSIS — M7542 Impingement syndrome of left shoulder: Secondary | ICD-10-CM | POA: Diagnosis not present

## 2023-05-03 ENCOUNTER — Other Ambulatory Visit: Payer: Self-pay | Admitting: Family Medicine

## 2023-05-03 DIAGNOSIS — M25851 Other specified joint disorders, right hip: Secondary | ICD-10-CM | POA: Diagnosis not present

## 2023-05-03 DIAGNOSIS — M545 Low back pain, unspecified: Secondary | ICD-10-CM | POA: Diagnosis not present

## 2023-05-03 DIAGNOSIS — E782 Mixed hyperlipidemia: Secondary | ICD-10-CM

## 2023-05-03 DIAGNOSIS — N951 Menopausal and female climacteric states: Secondary | ICD-10-CM

## 2023-05-03 DIAGNOSIS — M357 Hypermobility syndrome: Secondary | ICD-10-CM

## 2023-05-16 DIAGNOSIS — M7542 Impingement syndrome of left shoulder: Secondary | ICD-10-CM | POA: Diagnosis not present

## 2023-05-16 DIAGNOSIS — M25512 Pain in left shoulder: Secondary | ICD-10-CM | POA: Diagnosis not present

## 2023-05-19 DIAGNOSIS — M545 Low back pain, unspecified: Secondary | ICD-10-CM | POA: Diagnosis not present

## 2023-05-25 DIAGNOSIS — M545 Low back pain, unspecified: Secondary | ICD-10-CM | POA: Diagnosis not present

## 2023-05-26 ENCOUNTER — Other Ambulatory Visit: Payer: BC Managed Care – PPO

## 2023-05-30 ENCOUNTER — Other Ambulatory Visit (HOSPITAL_BASED_OUTPATIENT_CLINIC_OR_DEPARTMENT_OTHER): Payer: BC Managed Care – PPO

## 2023-06-02 ENCOUNTER — Other Ambulatory Visit (HOSPITAL_BASED_OUTPATIENT_CLINIC_OR_DEPARTMENT_OTHER): Payer: BC Managed Care – PPO

## 2023-06-02 ENCOUNTER — Ambulatory Visit (HOSPITAL_BASED_OUTPATIENT_CLINIC_OR_DEPARTMENT_OTHER)
Admission: RE | Admit: 2023-06-02 | Discharge: 2023-06-02 | Disposition: A | Discharge status: Home / Self Care | Payer: BC Managed Care – PPO | Source: Ambulatory Visit | Attending: Family Medicine | Admitting: Family Medicine

## 2023-06-02 DIAGNOSIS — E2839 Other primary ovarian failure: Secondary | ICD-10-CM | POA: Insufficient documentation

## 2023-06-02 DIAGNOSIS — M810 Age-related osteoporosis without current pathological fracture: Secondary | ICD-10-CM | POA: Diagnosis not present

## 2023-06-06 ENCOUNTER — Encounter: Payer: Self-pay | Admitting: Family Medicine

## 2023-06-06 DIAGNOSIS — M810 Age-related osteoporosis without current pathological fracture: Secondary | ICD-10-CM | POA: Insufficient documentation

## 2023-06-07 DIAGNOSIS — M545 Low back pain, unspecified: Secondary | ICD-10-CM | POA: Diagnosis not present

## 2023-06-09 ENCOUNTER — Ambulatory Visit
Admission: RE | Admit: 2023-06-09 | Discharge: 2023-06-09 | Disposition: A | Discharge status: Home / Self Care | Payer: No Typology Code available for payment source | Source: Ambulatory Visit | Attending: Family Medicine | Admitting: Family Medicine

## 2023-06-09 DIAGNOSIS — M357 Hypermobility syndrome: Secondary | ICD-10-CM

## 2023-06-09 DIAGNOSIS — E782 Mixed hyperlipidemia: Secondary | ICD-10-CM

## 2023-06-09 DIAGNOSIS — N951 Menopausal and female climacteric states: Secondary | ICD-10-CM

## 2023-06-12 ENCOUNTER — Encounter (INDEPENDENT_AMBULATORY_CARE_PROVIDER_SITE_OTHER): Payer: BC Managed Care – PPO | Admitting: Family Medicine

## 2023-06-12 DIAGNOSIS — E785 Hyperlipidemia, unspecified: Secondary | ICD-10-CM

## 2023-06-13 DIAGNOSIS — E785 Hyperlipidemia, unspecified: Secondary | ICD-10-CM

## 2023-06-13 NOTE — Telephone Encounter (Signed)

## 2023-06-17 DIAGNOSIS — M545 Low back pain, unspecified: Secondary | ICD-10-CM | POA: Diagnosis not present

## 2023-06-22 DIAGNOSIS — M25851 Other specified joint disorders, right hip: Secondary | ICD-10-CM | POA: Diagnosis not present

## 2023-06-22 DIAGNOSIS — M545 Low back pain, unspecified: Secondary | ICD-10-CM | POA: Diagnosis not present

## 2023-06-30 DIAGNOSIS — M7542 Impingement syndrome of left shoulder: Secondary | ICD-10-CM | POA: Diagnosis not present

## 2023-07-02 NOTE — Patient Instructions (Incomplete)
It was good to see you again today Recommend annual flu shot, COVID booster this fall  Let me know if you have any adverse effects from the Fosamax medication.  Assuming you are tolerating this well, we can plan to recheck your bone density in about 2 years  I sent in a vaginal estrogen tablet for you, please let me know if this is not covered by your insurance

## 2023-07-02 NOTE — Progress Notes (Unsigned)
[facility] [address], [address] [place], [place] [address] [phone]  Date:  07/04/2023   Name:  Melanie Taylor   DOB:  07/17/1966   MRN:  [id number]  PCP:  Pearline Cables, MD    Chief Complaint: Osteoporosis (Discuss Tx/Flu shot today: will discuss /)   History of Present Illness:  Melanie Taylor is a 57 y.o. very pleasant female patient who presents with the following:  Patient seen today to discuss her bone density Most recent visit with was in August of last year for her physical History of IBS, hyperthyroidism, prediabetes, GERD, anxiety   Her bone density test from August of this year showed early onset osteoporosis with a T-score of -2.9 I had suggested starting her on Fosamax, she is willing to give this a try.  No history of swallowing difficulty  They have her on topical estrogen and progesterone from her "integrative doctor" but she wonders if I can give her a vaginal estrogen instead. She would prefer to use the vaginal tablet if she can as the cream is messy and tends to leak out   No history of breast cancer or ovarian cancer in self  She is frustrated by "difficulty with weight loss", notes that she has more fat around her midsection than when she was younger  Lab Results  Component Value Date   HGBA1C 6.0 06/03/2022     Patient Active Problem List   Diagnosis Date Noted   Osteoporosis 06/06/2023   Dyspepsia 07/13/2022   Prediabetes 05/09/2022   IBS (irritable bowel syndrome) 01/02/2016   Hyperthyroidism 05/24/2011   Anxiety 05/24/2011   GERD 04/10/2008    Past Medical History:  Diagnosis Date   Allergy    Arthritis    GERD (gastroesophageal reflux disease)    IBS (irritable bowel syndrome)    Thyroid disease    hyperthyroidism    Past Surgical History:  Procedure Laterality Date   AUGMENTATION MAMMAPLASTY  11/2004   CESAREAN SECTION  06/2003   one time   COLONOSCOPY     over 10  years ago with Dr Kinnie Scales   KNEE SURGERY  2003   left   SHOULDER SURGERY  2010   right shoulder    Social History   Tobacco Use   Smoking status: Never   Smokeless tobacco: Never  Vaping Use   Vaping status: Never Used  Substance Use Topics   Alcohol use: Yes    Comment: socially   Drug use: No    Family History  Problem Relation Age of Onset   Alcohol abuse Father    Heart attack Father    Sudden death Father    Diabetes Father    Heart disease Father    Arthritis Mother    Hypothyroidism Mother    Hypothyroidism Maternal Grandmother    Cancer Maternal Grandfather        COLON   Colon cancer Maternal Grandfather    Diabetes Other        GRANDFATHER   Esophageal cancer Neg Hx    Rectal cancer Neg Hx    Stomach cancer Neg Hx     No Known Allergies  Medication list has been reviewed and updated.  Current Outpatient Medications on File Prior to Visit  Medication Sig Dispense Refill   ALPRAZolam (XANAX) 0.5 MG tablet TAKE 1 TABLET BY MOUTH THREE TIMES A DAY AS NEEDED FOR ANXIETY OR SLEEP 60 tablet  2   lansoprazole (PREVACID) 15 MG capsule Take 15 mg by mouth daily at 12 noon.     levocetirizine (XYZAL) 5 MG tablet TAKE 1 TABLET BY MOUTH EVERY DAY IN THE EVENING 90 tablet 1   levothyroxine (SYNTHROID, LEVOTHROID) 25 MCG tablet 75 mcg daily before breakfast.   1   Multiple Vitamin (MULTIVITAMIN) capsule Take 1 capsule by mouth daily.     valACYclovir (VALTREX) 500 MG tablet TAKE 4 TABLETS (2GM) ONCE, REPEAT IN 12 HOURS AS NEEDED FOR COLD SORE 30 tablet 0   VITAMIN D PO Take by mouth.     Current Facility-Administered Medications on File Prior to Visit  Medication Dose Route Frequency Provider Last Rate Last Admin   0.9 %  sodium chloride infusion  500 mL Intravenous Once Iva Boop, MD        Review of Systems:  As per HPI- otherwise negative.   Physical Examination: Vitals:   07/04/23 0921  BP: 122/60  Pulse: 60  Resp: 18  Temp: 97.9 F (36.6 C)   SpO2: 98%   Vitals:   07/04/23 0921  Weight: 121 lb 6.4 oz (55.1 kg)  Height: 5\' 2"  (1.575 m)   Body mass index is 22.2 kg/m. Ideal Body Weight: Weight in (lb) to have BMI = 25: 136.4  GEN: no acute distress.  Looks well, normal weight/slender build HEENT: Atraumatic, Normocephalic.  Ears and Nose: No external deformity. CV: RRR, No M/G/R. No JVD. No thrill. No extra heart sounds. PULM: CTA B, no wheezes, crackles, rhonchi. No retractions. No resp. distress. No accessory muscle use. ABD: S, NT, ND, +BS. No rebound. No HSM. EXTR: No c/c/e PSYCH: Normally interactive. Conversant.    Assessment and Plan: Age-related osteoporosis without current pathological fracture - Plan: alendronate (FOSAMAX) 70 MG tablet, VITAMIN D 25 Hydroxy (Vit-D Deficiency, Fractures)  Vaginal dryness - Plan: Estradiol 10 MCG TABS vaginal tablet  Screening for hyperlipidemia - Plan: Lipid panel  Screening for deficiency anemia - Plan: CBC  Hyperthyroidism - Plan: TSH  Pre-diabetes - Plan: Comprehensive metabolic panel, Hemoglobin A1c  Patient seen today for follow-up.  As above, she was unfortunately diagnosed with osteoporosis despite her young age.  She would like to get started on Fosamax.  I have asked her to let me know if any difficulty with this medication, assuming well-tolerated we plan to rescan her bone density in 2 years  Will have her try an estradiol 10 mcg vaginal tablet  Other routine blood work is pending as above Reassured that some change in fat distribution is very normal after menopause.  Certainly her overall weight and body size is well within normal  Signed Abbe Amsterdam, MD  Received labs as below, message to patient  Results for orders placed or performed in visit on 07/04/23  CBC  Result Value Ref Range   WBC 5.3 4.0 - 10.5 K/uL   RBC 4.64 3.87 - 5.11 Mil/uL   Platelets 307.0 150.0 - 400.0 K/uL   Hemoglobin 13.7 12.0 - 15.0 g/dL   HCT 16.1 09.6 - 04.5 %   MCV  91.5 78.0 - 100.0 fl   MCHC 32.2 30.0 - 36.0 g/dL   RDW 40.9 81.1 - 91.4 %  Comprehensive metabolic panel  Result Value Ref Range   Sodium 140 135 - 145 mEq/L   Potassium 5.5 No hemolysis seen (H) 3.5 - 5.1 mEq/L   Chloride 104 96 - 112 mEq/L   CO2 29 19 - 32 mEq/L   Glucose,  Bld 88 70 - 99 mg/dL   BUN 13 6 - 23 mg/dL   Creatinine, Ser 4.40 0.40 - 1.20 mg/dL   Total Bilirubin 0.5 0.2 - 1.2 mg/dL   Alkaline Phosphatase 70 39 - 117 U/L   AST 18 0 - 37 U/L   ALT 9 0 - 35 U/L   Total Protein 6.5 6.0 - 8.3 g/dL   Albumin 4.3 3.5 - 5.2 g/dL   GFR 10.27 >25.36 mL/min   Calcium 9.4 8.4 - 10.5 mg/dL  Hemoglobin U4Q  Result Value Ref Range   Hgb A1c MFr Bld 5.7 4.6 - 6.5 %  Lipid panel  Result Value Ref Range   Cholesterol 183 0 - 200 mg/dL   Triglycerides 03.4 0.0 - 149.0 mg/dL   HDL 74.25 >95.63 mg/dL   VLDL 87.5 0.0 - 64.3 mg/dL   LDL Cholesterol 91 0 - 99 mg/dL   Total CHOL/HDL Ratio 2    NonHDL 106.44   TSH  Result Value Ref Range   TSH 2.40 0.35 - 5.50 uIU/mL  VITAMIN D 25 Hydroxy (Vit-D Deficiency, Fractures)  Result Value Ref Range   VITD 53.63 30.00 - 100.00 ng/mL

## 2023-07-03 DIAGNOSIS — M545 Low back pain, unspecified: Secondary | ICD-10-CM | POA: Diagnosis not present

## 2023-07-04 ENCOUNTER — Ambulatory Visit (INDEPENDENT_AMBULATORY_CARE_PROVIDER_SITE_OTHER): Payer: BC Managed Care – PPO | Admitting: Family Medicine

## 2023-07-04 ENCOUNTER — Encounter: Payer: Self-pay | Admitting: Family Medicine

## 2023-07-04 VITALS — BP 122/60 | HR 60 | Temp 97.9°F | Resp 18 | Ht 62.0 in | Wt 121.4 lb

## 2023-07-04 DIAGNOSIS — Z130 Encounter for screening for diseases of the blood and blood-forming organs and certain disorders involving the immune mechanism: Secondary | ICD-10-CM

## 2023-07-04 DIAGNOSIS — M810 Age-related osteoporosis without current pathological fracture: Secondary | ICD-10-CM

## 2023-07-04 DIAGNOSIS — R7303 Prediabetes: Secondary | ICD-10-CM

## 2023-07-04 DIAGNOSIS — E875 Hyperkalemia: Secondary | ICD-10-CM

## 2023-07-04 DIAGNOSIS — N898 Other specified noninflammatory disorders of vagina: Secondary | ICD-10-CM | POA: Diagnosis not present

## 2023-07-04 DIAGNOSIS — E0590 Thyrotoxicosis, unspecified without thyrotoxic crisis or storm: Secondary | ICD-10-CM | POA: Diagnosis not present

## 2023-07-04 DIAGNOSIS — Z13220 Encounter for screening for lipoid disorders: Secondary | ICD-10-CM

## 2023-07-04 LAB — COMPREHENSIVE METABOLIC PANEL
ALT: 9 U/L (ref 0–35)
AST: 18 U/L (ref 0–37)
Albumin: 4.3 g/dL (ref 3.5–5.2)
Alkaline Phosphatase: 70 U/L (ref 39–117)
BUN: 13 mg/dL (ref 6–23)
CO2: 29 mEq/L (ref 19–32)
Calcium: 9.4 mg/dL (ref 8.4–10.5)
Chloride: 104 mEq/L (ref 96–112)
Creatinine, Ser: 0.86 mg/dL (ref 0.40–1.20)
GFR: 76.36 mL/min (ref >60.00)
Glucose, Bld: 88 mg/dL (ref 70–99)
Potassium: 5.5 mEq/L — ABNORMAL HIGH (ref 3.5–5.1)
Sodium: 140 mEq/L (ref 135–145)
Total Bilirubin: 0.5 mg/dL (ref 0.2–1.2)
Total Protein: 6.5 g/dL (ref 6.0–8.3)

## 2023-07-04 LAB — CBC
HCT: 42.4 % (ref 36.0–46.0)
Hemoglobin: 13.7 g/dL (ref 12.0–15.0)
MCHC: 32.2 g/dL (ref 30.0–36.0)
MCV: 91.5 fl (ref 78.0–100.0)
Platelets: 307 10*3/uL (ref 150.0–400.0)
RBC: 4.64 Mil/uL (ref 3.87–5.11)
RDW: 13.9 % (ref 11.5–15.5)
WBC: 5.3 10*3/uL (ref 4.0–10.5)

## 2023-07-04 LAB — LIPID PANEL
Cholesterol: 183 mg/dL (ref 0–200)
HDL: 76.4 mg/dL (ref >39.00)
LDL Cholesterol: 91 mg/dL (ref 0–99)
NonHDL: 106.44
Total CHOL/HDL Ratio: 2
Triglycerides: 79 mg/dL (ref 0.0–149.0)
VLDL: 15.8 mg/dL (ref 0.0–40.0)

## 2023-07-04 LAB — TSH: TSH: 2.4 u[IU]/mL (ref 0.35–5.50)

## 2023-07-04 LAB — HEMOGLOBIN A1C: Hgb A1c MFr Bld: 5.7 % (ref 4.6–6.5)

## 2023-07-04 LAB — VITAMIN D 25 HYDROXY (VIT D DEFICIENCY, FRACTURES): VITD: 53.63 ng/mL (ref 30.00–100.00)

## 2023-07-04 MED ORDER — ALENDRONATE SODIUM 70 MG PO TABS: 70.0000 mg | ORAL_TABLET | ORAL | 3 refills | Status: DC

## 2023-07-04 MED ORDER — ESTRADIOL 10 MCG VA TABS
10.0000 ug | ORAL_TABLET | VAGINAL | 2 refills | Status: DC
Start: 2023-07-04 — End: 2024-02-20

## 2023-07-12 DIAGNOSIS — M545 Low back pain, unspecified: Secondary | ICD-10-CM | POA: Diagnosis not present

## 2023-07-12 DIAGNOSIS — M25851 Other specified joint disorders, right hip: Secondary | ICD-10-CM | POA: Diagnosis not present

## 2023-07-17 DIAGNOSIS — M25512 Pain in left shoulder: Secondary | ICD-10-CM | POA: Diagnosis not present

## 2023-07-25 DIAGNOSIS — M25512 Pain in left shoulder: Secondary | ICD-10-CM | POA: Diagnosis not present

## 2023-07-26 DIAGNOSIS — M5416 Radiculopathy, lumbar region: Secondary | ICD-10-CM | POA: Diagnosis not present

## 2023-07-27 ENCOUNTER — Encounter: Payer: Self-pay | Admitting: Family Medicine

## 2023-07-28 ENCOUNTER — Ambulatory Visit (INDEPENDENT_AMBULATORY_CARE_PROVIDER_SITE_OTHER): Payer: BC Managed Care – PPO

## 2023-07-28 ENCOUNTER — Encounter: Payer: Self-pay | Admitting: Family Medicine

## 2023-07-28 ENCOUNTER — Other Ambulatory Visit (INDEPENDENT_AMBULATORY_CARE_PROVIDER_SITE_OTHER): Payer: BC Managed Care – PPO

## 2023-07-28 DIAGNOSIS — E875 Hyperkalemia: Secondary | ICD-10-CM

## 2023-07-28 DIAGNOSIS — Z23 Encounter for immunization: Secondary | ICD-10-CM

## 2023-07-28 LAB — POTASSIUM: Potassium: 4 meq/L (ref 3.5–5.1)

## 2023-08-01 ENCOUNTER — Telehealth: Payer: Self-pay

## 2023-08-01 NOTE — Telephone Encounter (Signed)
Dr Patsy Lager would like to start the pt on Prolia. She has tried and had side effects on Fosamax.

## 2023-08-01 NOTE — Telephone Encounter (Signed)
Message sent to PA team

## 2023-08-03 ENCOUNTER — Telehealth: Payer: Self-pay

## 2023-08-03 NOTE — Telephone Encounter (Signed)
Created new encounter for Prolia BIV. Will route encounter back once benefit verification is complete.  

## 2023-08-03 NOTE — Telephone Encounter (Signed)
Prolia VOB initiated via [email or web address] 

## 2023-08-08 NOTE — Telephone Encounter (Signed)
Pharmacy Patient Advocate Encounter   Received notification from  [facility] Portal that prior authorization for PROLIA is required/requested.   Insurance verification completed.   The patient is insured through [facility] .   Per test claim: PA required; PA submitted to [facility] via Fax Key/confirmation #/EOC --- Status is pending

## 2023-08-09 ENCOUNTER — Other Ambulatory Visit (HOSPITAL_COMMUNITY): Payer: Self-pay

## 2023-08-09 NOTE — Telephone Encounter (Signed)
Pt ready for scheduling for Prolia on or after : 08/09/23  Out-of-pocket cost due at time of visit: $0  Primary: [employer] [place] - Commercial Prolia co-insurance: 0% Admin fee co-insurance: 0%  Secondary: N/A Prolia co-insurance:  Admin fee co-insurance:   Medical Benefit Details: Date Benefits were checked: 08/05/23 Deductible: no/ Coinsurance: 0%/ Admin Fee: 0%  Prior Auth: Approved PA# [id number] Expiration Date: 08/08/23 to 08/07/24  # of doses approved: 120 units  Pharmacy benefit: Copay $-- If patient wants fill through the pharmacy benefit please send prescription to: [facility], and include estimated need by date in rx notes. Pharmacy will ship medication directly to the office.  Patient is eligible for Prolia Copay Card. Copay Card can make patient's cost as little as $25. Link to apply: [email or web address]://[email or web address]  ** This summary of benefits is an estimation of the patient's out-of-pocket cost. Exact cost may very based on individual plan coverage.

## 2023-08-09 NOTE — Telephone Encounter (Signed)
Pharmacy Patient Advocate Encounter  Received notification from [facility] that Prior Authorization for Prolia has been APPROVED from 08/08/23 to 08/07/24  of in office (buy and bill)   PA #/Case ID/Reference #: [id number]

## 2023-08-12 NOTE — Telephone Encounter (Signed)
Per Prolia Admin team- okay to schedule. We will move forward with New Protocal at this [place].   Pt has been scheduled.

## 2023-08-12 NOTE — Telephone Encounter (Signed)
Holding for now- until PA/Prolia Admin advises on how to move forward. D/T new Protocol rollout.

## 2023-08-24 ENCOUNTER — Other Ambulatory Visit: Payer: Self-pay | Admitting: Family Medicine

## 2023-08-24 DIAGNOSIS — B001 Herpesviral vesicular dermatitis: Secondary | ICD-10-CM

## 2023-08-25 ENCOUNTER — Other Ambulatory Visit: Payer: Self-pay

## 2023-08-25 ENCOUNTER — Ambulatory Visit: Payer: BC Managed Care – PPO

## 2023-08-25 ENCOUNTER — Telehealth: Payer: Self-pay

## 2023-08-25 DIAGNOSIS — M810 Age-related osteoporosis without current pathological fracture: Secondary | ICD-10-CM

## 2023-08-25 MED ORDER — DENOSUMAB 60 MG/ML ~~LOC~~ SOSY
60.0000 mg | PREFILLED_SYRINGE | Freq: Once | SUBCUTANEOUS | Status: AC
  Administered 2023-08-25: 60 mg via SUBCUTANEOUS

## 2023-08-25 MED ORDER — DENOSUMAB 60 MG/ML ~~LOC~~ SOSY: 60.0000 mg | PREFILLED_SYRINGE | Freq: Once | SUBCUTANEOUS | Status: DC

## 2023-08-25 NOTE — Progress Notes (Signed)
Prolia 60 mg SQ , was administered SubQ L arm today. Patient tolerated injection well. Patient next injection due: 6 months, appt made:.No- will schedule in 5 months after benifits are ran again Initial injection: Yes Patient supplied: No

## 2023-08-25 NOTE — Telephone Encounter (Signed)
Pt due 02/22/24 Cam placed.

## 2023-08-25 NOTE — Telephone Encounter (Signed)
Pt was in office today for initial Prolia injection. Injection was given in L arm SubQ pt tolerated well.

## 2023-09-01 DIAGNOSIS — M25551 Pain in right hip: Secondary | ICD-10-CM | POA: Diagnosis not present

## 2023-09-12 DIAGNOSIS — E782 Mixed hyperlipidemia: Secondary | ICD-10-CM | POA: Diagnosis not present

## 2023-09-12 DIAGNOSIS — E039 Hypothyroidism, unspecified: Secondary | ICD-10-CM | POA: Diagnosis not present

## 2023-09-12 DIAGNOSIS — D6489 Other specified anemias: Secondary | ICD-10-CM | POA: Diagnosis not present

## 2023-09-12 DIAGNOSIS — Z8249 Family history of ischemic heart disease and other diseases of the circulatory system: Secondary | ICD-10-CM | POA: Diagnosis not present

## 2023-09-12 DIAGNOSIS — E559 Vitamin D deficiency, unspecified: Secondary | ICD-10-CM | POA: Diagnosis not present

## 2023-09-12 DIAGNOSIS — N951 Menopausal and female climacteric states: Secondary | ICD-10-CM | POA: Diagnosis not present

## 2023-09-12 DIAGNOSIS — M357 Hypermobility syndrome: Secondary | ICD-10-CM | POA: Diagnosis not present

## 2023-09-12 DIAGNOSIS — R799 Abnormal finding of blood chemistry, unspecified: Secondary | ICD-10-CM | POA: Diagnosis not present

## 2023-09-12 DIAGNOSIS — I7090 Unspecified atherosclerosis: Secondary | ICD-10-CM | POA: Diagnosis not present

## 2023-09-13 ENCOUNTER — Telehealth: Payer: BC Managed Care – PPO | Admitting: Physician Assistant

## 2023-09-13 DIAGNOSIS — B3731 Acute candidiasis of vulva and vagina: Secondary | ICD-10-CM | POA: Diagnosis not present

## 2023-09-13 MED ORDER — FLUCONAZOLE 150 MG PO TABS: 150.0000 mg | ORAL_TABLET | Freq: Every day | ORAL | 0 refills | Status: DC

## 2023-09-13 NOTE — Progress Notes (Signed)

## 2023-09-14 ENCOUNTER — Encounter: Payer: Self-pay | Admitting: Family Medicine

## 2023-09-22 DIAGNOSIS — M25551 Pain in right hip: Secondary | ICD-10-CM | POA: Diagnosis not present

## 2023-09-24 ENCOUNTER — Other Ambulatory Visit: Payer: Self-pay | Admitting: Family Medicine

## 2023-09-24 DIAGNOSIS — F419 Anxiety disorder, unspecified: Secondary | ICD-10-CM

## 2023-09-26 NOTE — Telephone Encounter (Signed)
Requesting: Xanax 0.5 mg Contract: N/A UDS: N/A Last Visit: 07/04/2023 Next Visit: N/A Last Refill: 03/14/2023  Please Advise

## 2023-09-30 ENCOUNTER — Telehealth: Payer: BC Managed Care – PPO

## 2023-09-30 ENCOUNTER — Telehealth: Payer: BC Managed Care – PPO | Admitting: Family Medicine

## 2023-09-30 DIAGNOSIS — B9689 Other specified bacterial agents as the cause of diseases classified elsewhere: Secondary | ICD-10-CM | POA: Diagnosis not present

## 2023-09-30 DIAGNOSIS — J019 Acute sinusitis, unspecified: Secondary | ICD-10-CM

## 2023-09-30 MED ORDER — AMOXICILLIN-POT CLAVULANATE 875-125 MG PO TABS: 1.0000 | ORAL_TABLET | Freq: Two times a day (BID) | ORAL | 0 refills | Status: DC

## 2023-09-30 NOTE — Progress Notes (Signed)
E-Visit for Sinus Problems  We are sorry that you are not feeling well.  Here is how we plan to help!  Based on what you have shared with me it looks like you have sinusitis.  Sinusitis is inflammation and infection in the sinus cavities of the head.  Based on your presentation I believe you most likely have Acute Bacterial Sinusitis.  This is an infection caused by bacteria and is treated with antibiotics. I have prescribed Augmentin 875mg/125mg one tablet twice daily with food, for 7 days. You may use an oral decongestant such as Mucinex D or if you have glaucoma or high blood pressure use plain Mucinex. Saline nasal spray help and can safely be used as often as needed for congestion.  If you develop worsening sinus pain, fever or notice severe headache and vision changes, or if symptoms are not better after completion of antibiotic, please schedule an appointment with a health care provider.    Sinus infections are not as easily transmitted as other respiratory infection, however we still recommend that you avoid close contact with loved ones, especially the very young and elderly.  Remember to wash your hands thoroughly throughout the day as this is the number one way to prevent the spread of infection!  Home Care: Only take medications as instructed by your medical team. Complete the entire course of an antibiotic. Do not take these medications with alcohol. A steam or ultrasonic humidifier can help congestion.  You can place a towel over your head and breathe in the steam from hot water coming from a faucet. Avoid close contacts especially the very young and the elderly. Cover your mouth when you cough or sneeze. Always remember to wash your hands.  Get Help Right Away If: You develop worsening fever or sinus pain. You develop a severe head ache or visual changes. Your symptoms persist after you have completed your treatment plan.  Make sure you Understand these instructions. Will watch  your condition. Will get help right away if you are not doing well or get worse.  Thank you for choosing an e-visit.  Your e-visit answers were reviewed by a board certified advanced clinical practitioner to complete your personal care plan. Depending upon the condition, your plan could have included both over the counter or prescription medications.  Please review your pharmacy choice. Make sure the pharmacy is open so you can pick up prescription now. If there is a problem, you may contact your provider through MyChart messaging and have the prescription routed to another pharmacy.  Your safety is important to us. If you have drug allergies check your prescription carefully.   For the next 24 hours you can use MyChart to ask questions about today's visit, request a non-urgent call back, or ask for a work or school excuse. You will get an email in the next two days asking about your experience. I hope that your e-visit has been valuable and will speed your recovery.    have provided 5 minutes of non face to face time during this encounter for chart review and documentation.   

## 2023-10-03 ENCOUNTER — Ambulatory Visit
Admission: EM | Admit: 2023-10-03 | Discharge: 2023-10-03 | Disposition: A | Discharge status: Home / Self Care | Payer: BC Managed Care – PPO

## 2023-10-03 ENCOUNTER — Telehealth: Payer: BC Managed Care – PPO | Admitting: Family Medicine

## 2023-10-03 ENCOUNTER — Encounter: Payer: Self-pay | Admitting: Family Medicine

## 2023-10-03 DIAGNOSIS — R059 Cough, unspecified: Secondary | ICD-10-CM | POA: Diagnosis not present

## 2023-10-03 DIAGNOSIS — J309 Allergic rhinitis, unspecified: Secondary | ICD-10-CM

## 2023-10-03 DIAGNOSIS — J0100 Acute maxillary sinusitis, unspecified: Secondary | ICD-10-CM

## 2023-10-03 IMAGING — CT CT ABD-PELV W/ CM
2 of 5 series · 16 of 46 positions shown, 18 images · IV contrast (OMNIPAQUE 300)
Comparison: None.

CLINICAL DATA: Abdominal pain, acute, nonlocalized episodic lower
abdominal pain x 3 weeks - increasing in intensity/frequency

EXAM:
CT ABDOMEN AND PELVIS WITH CONTRAST
TECHNIQUE: Multidetector CT imaging of the abdomen and pelvis was performed
using the standard protocol following bolus administration of
intravenous contrast.

[Series 2: abd/pel w · axial · 0.67mm/px · z∈[-398,-43]mm · 13 of 81 slices shown, 15 images]
[im 5/81  soft-tissue]
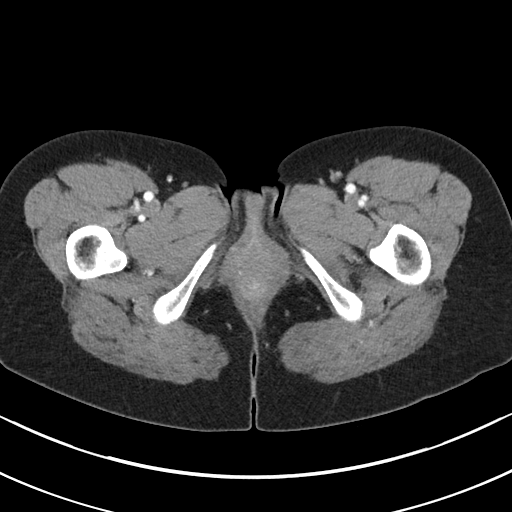
[im 5/81  bone]
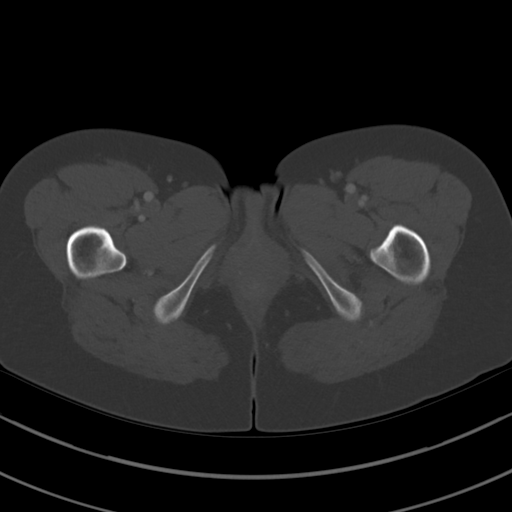
[im 9/81  soft-tissue]
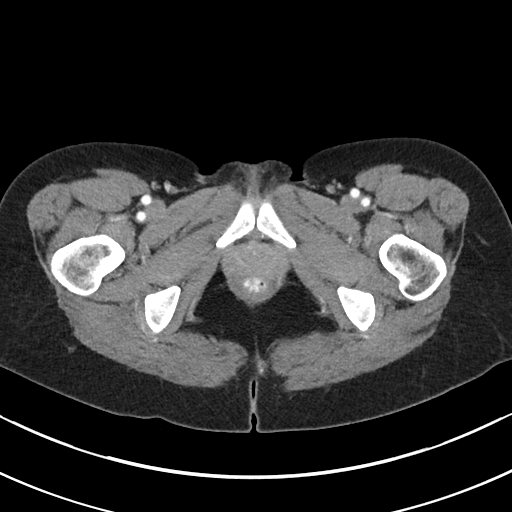
[im 18/81  soft-tissue]
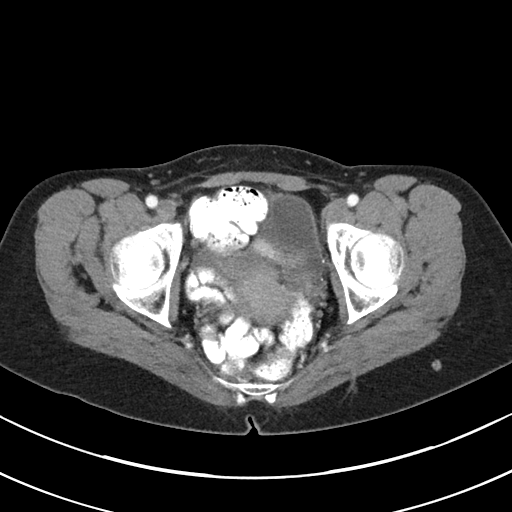
[im 23/81  soft-tissue]
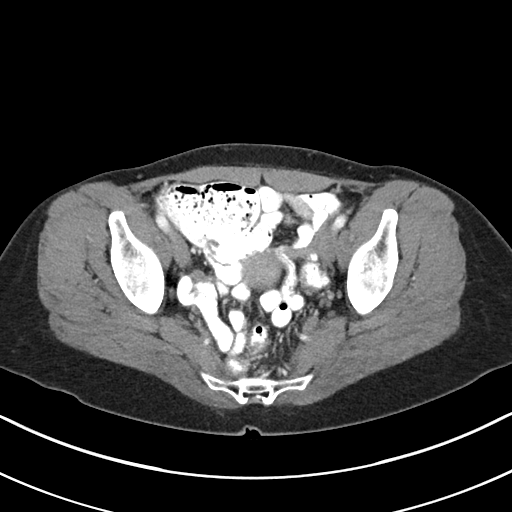
[im 27/81  soft-tissue]
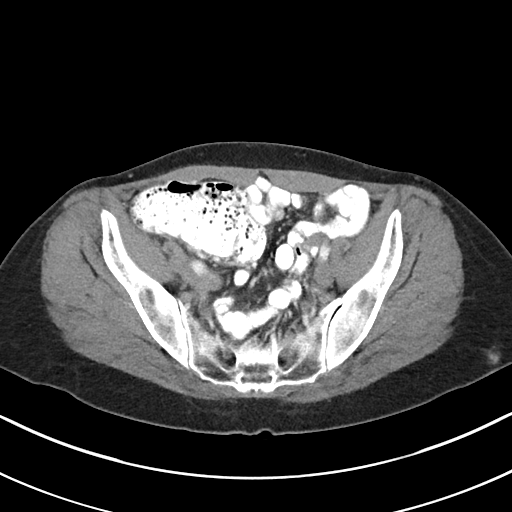
[im 36/81  soft-tissue]
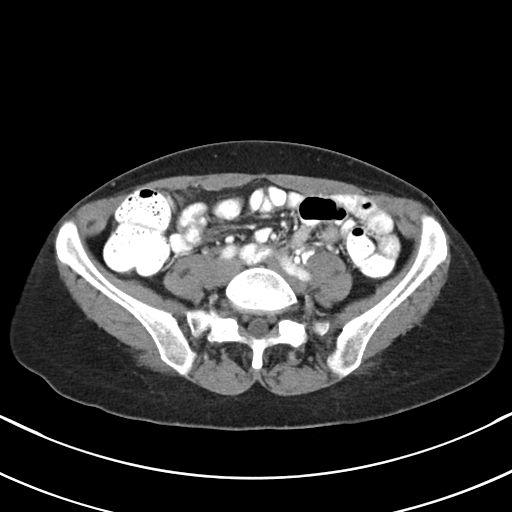
[im 41/81  soft-tissue]
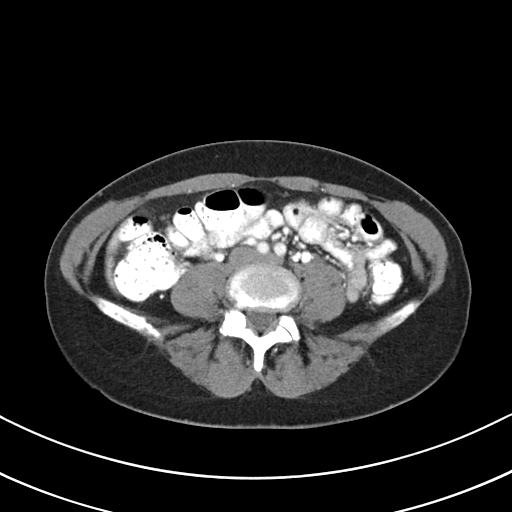
[im 45/81  soft-tissue]
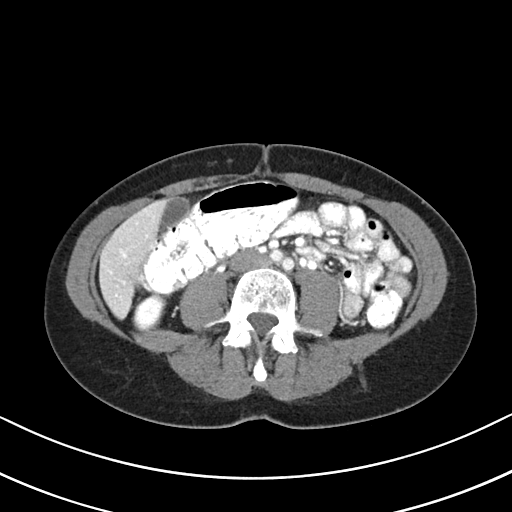
[im 54/81  soft-tissue]
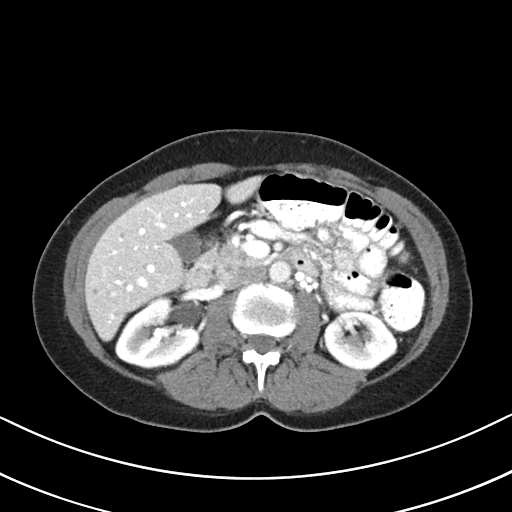
[im 54/81  bone]
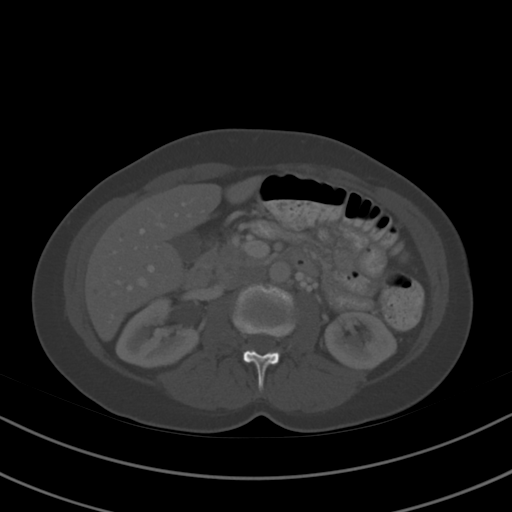
[im 58/81  soft-tissue]
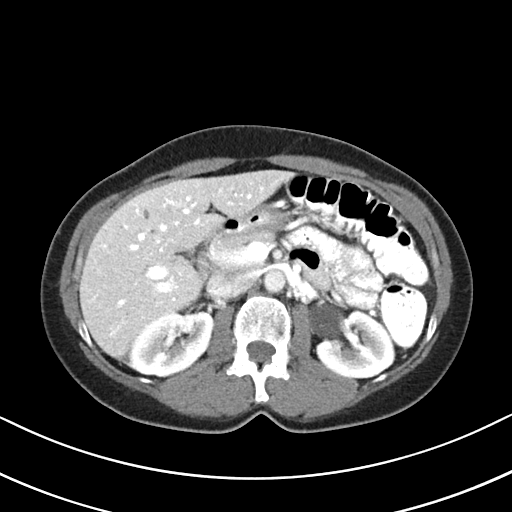
[im 63/81  soft-tissue]
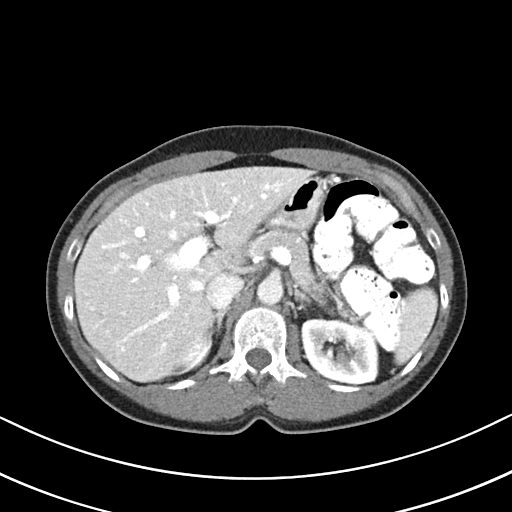
[im 72/81  soft-tissue]
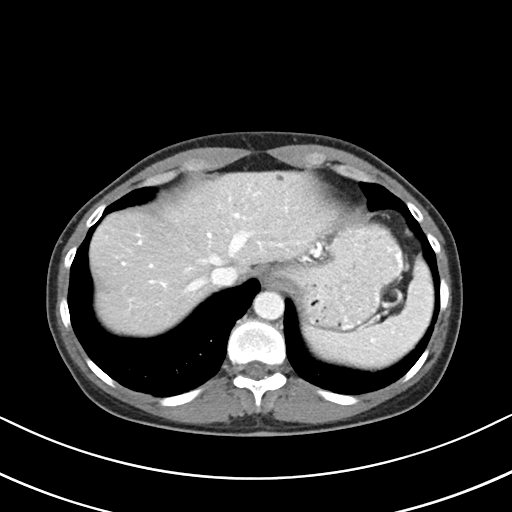
[im 76/81  soft-tissue]
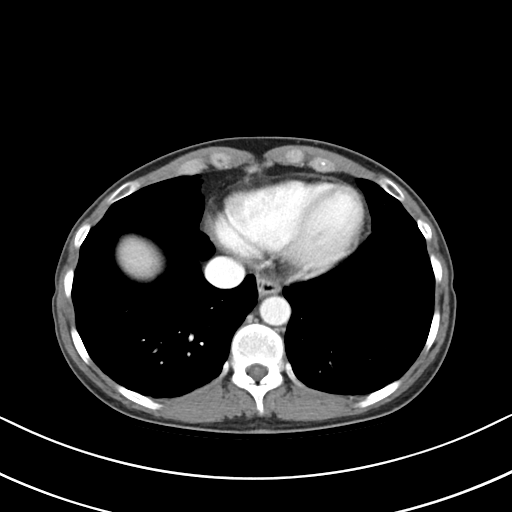

[Series 5: coronal st · coronal · 0.57mm/px · 3 of 65 slices shown]
[im 22/65  soft-tissue]
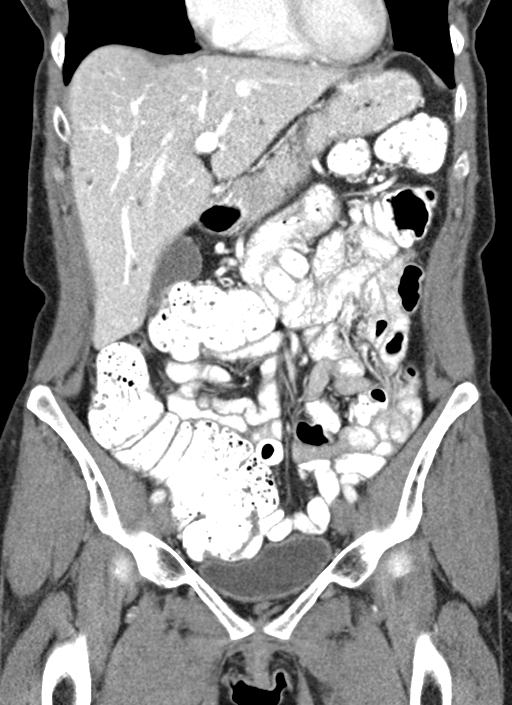
[im 29/65  soft-tissue]
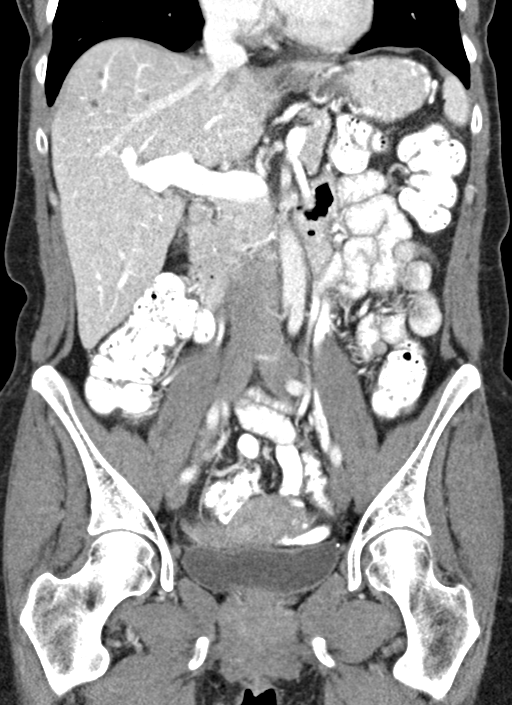
[im 36/65  soft-tissue]
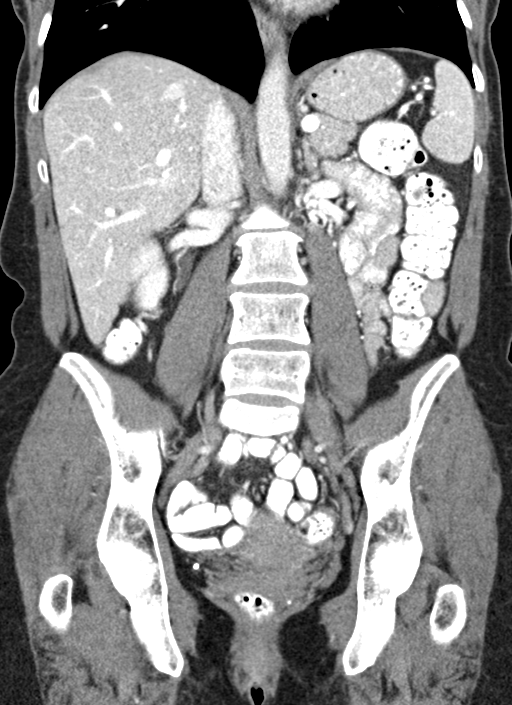

[16 of 46 positions shown; findings below may reference images not displayed]

RADIATION DOSE REDUCTION: This exam was performed according to the
departmental dose-optimization program which includes automated
exposure control, adjustment of the mA and/or kV according to
patient size and/or use of iterative reconstruction technique.

CONTRAST:  100mL OMNIPAQUE IOHEXOL 300 MG/ML  SOLN
FINDINGS: Lower chest: No acute abnormality.

Hepatobiliary: Numerous too small to characterize low-density
lesions may reflect small cysts. Gallbladder is unremarkable. No
biliary dilatation.

Pancreas: Unremarkable.

Spleen: Unremarkable.

Adrenals/Urinary Tract: Adrenals, kidneys, and bladder are
unremarkable.

Stomach/Bowel: Stomach is within normal limits. Bowel is normal in
caliber.

Vascular/Lymphatic: No significant vascular abnormality. No enlarged
nodes.

Reproductive: Uterus and bilateral adnexa are unremarkable.

Other: No free fluid.  Abdominal wall is unremarkable.

Musculoskeletal: No acute osseous abnormality.
IMPRESSION: No acute abnormality.  No findings to account for reported symptoms.

## 2023-10-03 MED ORDER — BENZONATATE 200 MG PO CAPS: 200.0000 mg | ORAL_CAPSULE | Freq: Three times a day (TID) | ORAL | 0 refills | Status: AC | PRN

## 2023-10-03 MED ORDER — PREDNISONE 50 MG PO TABS: ORAL_TABLET | ORAL | 0 refills | Status: DC

## 2023-10-03 MED ORDER — FEXOFENADINE HCL 180 MG PO TABS: 180.0000 mg | ORAL_TABLET | Freq: Every day | ORAL | 0 refills | Status: DC

## 2023-10-03 MED ORDER — PROMETHAZINE-DM 6.25-15 MG/5ML PO SYRP: 5.0000 mL | ORAL_SOLUTION | Freq: Two times a day (BID) | ORAL | 0 refills | Status: DC | PRN

## 2023-10-03 NOTE — ED Provider Notes (Signed)
Melanie Taylor CARE    CSN: [id number] Arrival date & time: 10/03/23  0940      History   Chief Complaint Chief Complaint  Patient presents with   URI    HPI Melanie Taylor is a 56 y.o. female.   HPI 57 year old female presents with cough and fever for 3 days.  Patient reports ED visit prescribed amoxicillin and Mucinex.  Reports negative home COVID-19 test.  PMH significant for IBS and thyroid disease.  Past Medical History:  Diagnosis Date   Allergy    Arthritis    GERD (gastroesophageal reflux disease)    IBS (irritable bowel syndrome)    Thyroid disease    hyperthyroidism    Patient Active Problem List   Diagnosis Date Noted   Osteoporosis 06/06/2023   Dyspepsia 07/13/2022   Prediabetes 05/09/2022   IBS (irritable bowel syndrome) 01/02/2016   Hyperthyroidism 05/24/2011   Anxiety 05/24/2011   GERD 04/10/2008    Past Surgical History:  Procedure Laterality Date   AUGMENTATION MAMMAPLASTY  11/2004   CESAREAN SECTION  06/2003   one time   COLONOSCOPY     over 10 years ago with Dr Kinnie Scales   KNEE SURGERY  2003   left   SHOULDER SURGERY  2010   right shoulder    OB History   No obstetric history on file.      Home Medications    Prior to Admission medications   Medication Sig Start Date End Date Taking? Authorizing Provider  benzonatate (TESSALON) 200 MG capsule Take 1 capsule (200 mg total) by mouth 3 (three) times daily as needed for up to 7 days. 10/03/23 10/10/23 Yes Trevor Iha, FNP  fexofenadine [facility] ALLERGY) 180 MG tablet Take 1 tablet (180 mg total) by mouth daily for 15 days. 10/03/23 10/18/23 Yes Trevor Iha, FNP  levothyroxine (SYNTHROID) 50 MCG tablet Take 50 mcg by mouth every morning. 09/29/23  Yes [provider]  predniSONE (DELTASONE) 50 MG tablet Take 1 tab p.o. daily for 5 days. 10/03/23  Yes Trevor Iha, FNP  promethazine-dextromethorphan (PROMETHAZINE-DM) 6.25-15 MG/5ML syrup Take 5 mLs by mouth 2 (two)  times daily as needed for cough. 10/03/23  Yes Trevor Iha, FNP  ALPRAZolam Prudy Feeler) 0.5 MG tablet TAKE 1 TABLET BY MOUTH THREE TIMES A DAY AS NEEDED FOR ANXIETY OR SLEEP 09/26/23   Copland, Gwenlyn Found, MD  amoxicillin-clavulanate (AUGMENTIN) 875-125 MG tablet Take 1 tablet by mouth 2 (two) times daily. 09/30/23   Delorse Lek, FNP  Estradiol 10 MCG TABS vaginal tablet Place 1 tablet (10 mcg total) vaginally 2 (two) times a week. 07/04/23   Copland, Gwenlyn Found, MD  fluconazole (DIFLUCAN) 150 MG tablet Take 1 tablet (150 mg total) by mouth daily. 09/13/23   Ward, Tylene Fantasia, PA-C  lansoprazole (PREVACID) 15 MG capsule Take 15 mg by mouth daily at 12 noon.    [provider]  levocetirizine (XYZAL) 5 MG tablet TAKE 1 TABLET BY MOUTH EVERY DAY IN THE EVENING 06/23/20   Bradd Canary, MD  levothyroxine (SYNTHROID, LEVOTHROID) 25 MCG tablet 75 mcg daily before breakfast.  10/26/16   [provider]  Multiple Vitamin (MULTIVITAMIN) capsule Take 1 capsule by mouth daily.    [provider]  valACYclovir (VALTREX) 500 MG tablet TAKE 4 TABLETS (2GM) ONCE, REPEAT IN 12 HOURS AS NEEDED FOR COLD SORE 08/25/23   Copland, Gwenlyn Found, MD  VITAMIN D PO Take by mouth.    [provider]  Family History Family History  Problem Relation Age of Onset   Alcohol abuse Father    Heart attack Father    Sudden death Father    Diabetes Father    Heart disease Father    Arthritis Mother    Hypothyroidism Mother    Hypothyroidism Maternal Grandmother    Cancer Maternal Grandfather        COLON   Colon cancer Maternal Grandfather    Diabetes Other        GRANDFATHER   Esophageal cancer Neg Hx    Rectal cancer Neg Hx    Stomach cancer Neg Hx     Social History Social History   Tobacco Use   Smoking status: Never   Smokeless tobacco: Never  Vaping Use   Vaping status: Never Used  Substance Use Topics   Alcohol use: Yes    Comment: socially   Drug use: No      Allergies   Patient has no known allergies.   Review of Systems Review of Systems  HENT:  Positive for congestion.   Respiratory:  Positive for cough.      Physical Exam Triage Vital Signs ED Triage Vitals  Encounter Vitals Group     BP      Systolic BP Percentile      Diastolic BP Percentile      Pulse      Resp      Temp      Temp src      SpO2      Weight      Height      Head Circumference      Peak Flow      Pain Score      Pain Loc      Pain Education      Exclude from Growth Chart    No data found.  Updated Vital Signs BP 119/82   Pulse (!) 112   Temp 98.7 F (37.1 C)   Resp 16   LMP 08/26/2016   SpO2 98%   Physical Exam Vitals and nursing note reviewed.  Constitutional:      Appearance: Normal appearance. She is normal weight. She is ill-appearing.  HENT:     Head: Normocephalic and atraumatic.     Right Ear: Tympanic membrane and external ear normal.     Left Ear: Tympanic membrane and external ear normal.     Ears:     Comments: Significant eustachian tube dysfunction noted bilaterally on exam    Mouth/Throat:     Mouth: Mucous membranes are moist.     Pharynx: Oropharynx is clear.  Eyes:     Extraocular Movements: Extraocular movements intact.     Conjunctiva/sclera: Conjunctivae normal.     Pupils: Pupils are equal, round, and reactive to light.  Cardiovascular:     Rate and Rhythm: Normal rate and regular rhythm.     Pulses: Normal pulses.     Heart sounds: Normal heart sounds.  Pulmonary:     Effort: Pulmonary effort is normal.     Breath sounds: Normal breath sounds. No wheezing, rhonchi or rales.     Comments: Infrequent nonproductive cough noted on exam Musculoskeletal:        General: Normal range of motion.     Cervical back: Normal range of motion and neck supple.  Skin:    General: Skin is warm and dry.  Neurological:     General: No focal deficit present.  Mental Status: She is alert and oriented to person,  place, and time. Mental status is at baseline.  Psychiatric:        Mood and Affect: Mood normal.        Behavior: Behavior normal.      UC Treatments / Results  Labs (all labs ordered are listed, but only abnormal results are displayed) Labs Reviewed - No data to display  EKG   Radiology No results found.  Procedures Procedures (including critical care time)  Medications Ordered in UC Medications - No data to display  Initial Impression / Assessment and Plan / UC Course  I have reviewed the triage vital signs and the nursing notes.  Pertinent labs & imaging results that were available during my care of the patient were reviewed by me and considered in my medical decision making (see chart for details).     MDM: 1.  Acute maxillary sinusitis, recurrence not specified-patient prescribed Augmentin 875 mg tablet twice daily x 7 days through e-visit; 2.  Cough, unspecified type-Rx'd prednisone 50 mg tablet: Take 1 tablet daily x 5 days, Tessalon 200 mg capsules: Take 1 capsule 3 times daily, as needed for cough, Rx'd Promethazine DM 6.25-15 mg/5 mL syrup: Take 5 mL twice daily, as needed for cough; 3.  Allergic rhinitis, unspecified seasonality, unspecified trigger-Rx'd Allegra 180 mg fexofenadine daily x 5 days. Advised patient to take prednisone with first dose of Augmentin until complete.  Advised patient to take Allegra daily for the next 5 days, as needed for concurrent postnasal drainage/drip.  Advised may use Tessalon capsules daily or as needed for cough.  Advised may use Promethazine DM at night for cough prior to sleep due to sedate of effects.  Encouraged to increase daily water intake to 64 ounces per day while taking these medications.  Advised if symptoms worsen and/or unresolved please follow-up with PCP or here for further evaluation.  Patient discharged home, hemodynamically stable. Final Clinical Impressions(s) / UC Diagnoses   Final diagnoses:  Cough, unspecified  type  Acute maxillary sinusitis, recurrence not specified  Allergic rhinitis, unspecified seasonality, unspecified trigger     Discharge Instructions      Advised patient to take prednisone with first dose of Augmentin until complete.  Advised patient to take Allegra daily for the next 5 days, as needed for concurrent postnasal drainage/drip.  Advised may use Tessalon capsules daily or as needed for cough.  Advised may use Promethazine DM at night for cough prior to sleep due to sedate of effects.  Encouraged to increase daily water intake to 64 ounces per day while taking these medications.  Advised if symptoms worsen and/or unresolved please follow-up with PCP or here for further evaluation.     ED Prescriptions     Medication Sig Dispense Auth. Provider   benzonatate (TESSALON) 200 MG capsule Take 1 capsule (200 mg total) by mouth 3 (three) times daily as needed for up to 7 days. 40 capsule Trevor Iha, FNP   predniSONE (DELTASONE) 50 MG tablet Take 1 tab p.o. daily for 5 days. 5 tablet Trevor Iha, FNP   promethazine-dextromethorphan (PROMETHAZINE-DM) 6.25-15 MG/5ML syrup Take 5 mLs by mouth 2 (two) times daily as needed for cough. 118 mL Trevor Iha, FNP   fexofenadine [facility] ALLERGY) 180 MG tablet Take 1 tablet (180 mg total) by mouth daily for 15 days. 15 tablet Trevor Iha, FNP      PDMP not reviewed this encounter.   Trevor Iha, FNP 10/03/23 1031

## 2023-10-03 NOTE — Discharge Instructions (Addendum)
Advised patient to take prednisone with first dose of Augmentin until complete.  Advised patient to take Allegra daily for the next 5 days, as needed for concurrent postnasal drainage/drip.  Advised may use Tessalon capsules daily or as needed for cough.  Advised may use Promethazine DM at night for cough prior to sleep due to sedate of effects.  Encouraged to increase daily water intake to 64 ounces per day while taking these medications.  Advised if symptoms worsen and/or unresolved please follow-up with PCP or here for further evaluation.

## 2023-10-03 NOTE — ED Triage Notes (Signed)
Pt presents to uc with co of fevers since the 20th. Pt did an e-vist and was given amoxicillin and musinex. Pt reports symptoms are worsening with new onset of cough. Covid and flu neg at home.

## 2023-10-09 ENCOUNTER — Telehealth: Payer: BC Managed Care – PPO | Admitting: Nurse Practitioner

## 2023-10-09 DIAGNOSIS — T3695XA Adverse effect of unspecified systemic antibiotic, initial encounter: Secondary | ICD-10-CM | POA: Diagnosis not present

## 2023-10-09 DIAGNOSIS — B379 Candidiasis, unspecified: Secondary | ICD-10-CM

## 2023-10-09 MED ORDER — FLUCONAZOLE 150 MG PO TABS
150.0000 mg | ORAL_TABLET | ORAL | 0 refills | Status: DC
Start: 2023-10-09 — End: 2024-02-20

## 2023-10-09 NOTE — Progress Notes (Signed)
I have spent 5 minutes in review of e-visit questionnaire, review and updating patient chart, medical decision making and response to patient.  ° °Jerrell Mangel W Secilia Apps, NP ° °  °

## 2023-10-09 NOTE — Progress Notes (Signed)

## 2023-10-11 DIAGNOSIS — M7071 Other bursitis of hip, right hip: Secondary | ICD-10-CM | POA: Diagnosis not present

## 2023-10-11 DIAGNOSIS — M25551 Pain in right hip: Secondary | ICD-10-CM | POA: Diagnosis not present

## 2023-10-19 ENCOUNTER — Other Ambulatory Visit (HOSPITAL_BASED_OUTPATIENT_CLINIC_OR_DEPARTMENT_OTHER): Payer: Self-pay | Admitting: Family Medicine

## 2023-10-19 ENCOUNTER — Ambulatory Visit (HOSPITAL_BASED_OUTPATIENT_CLINIC_OR_DEPARTMENT_OTHER)
Admission: RE | Admit: 2023-10-19 | Discharge: 2023-10-19 | Disposition: A | Discharge status: Home / Self Care | Payer: BC Managed Care – PPO | Source: Ambulatory Visit | Attending: Family Medicine | Admitting: Family Medicine

## 2023-10-19 DIAGNOSIS — M25561 Pain in right knee: Secondary | ICD-10-CM | POA: Diagnosis not present

## 2023-10-19 DIAGNOSIS — M79669 Pain in unspecified lower leg: Secondary | ICD-10-CM | POA: Diagnosis not present

## 2023-10-19 DIAGNOSIS — M7989 Other specified soft tissue disorders: Secondary | ICD-10-CM | POA: Diagnosis not present

## 2023-10-19 DIAGNOSIS — M79604 Pain in right leg: Secondary | ICD-10-CM | POA: Diagnosis not present

## 2023-10-21 DIAGNOSIS — M25561 Pain in right knee: Secondary | ICD-10-CM | POA: Diagnosis not present

## 2023-11-07 DIAGNOSIS — M25551 Pain in right hip: Secondary | ICD-10-CM | POA: Diagnosis not present

## 2023-11-08 DIAGNOSIS — M25551 Pain in right hip: Secondary | ICD-10-CM | POA: Diagnosis not present

## 2023-11-14 DIAGNOSIS — M25551 Pain in right hip: Secondary | ICD-10-CM | POA: Diagnosis not present

## 2023-11-28 DIAGNOSIS — M25551 Pain in right hip: Secondary | ICD-10-CM | POA: Diagnosis not present

## 2023-12-01 DIAGNOSIS — M25561 Pain in right knee: Secondary | ICD-10-CM | POA: Diagnosis not present

## 2023-12-06 DIAGNOSIS — M25552 Pain in left hip: Secondary | ICD-10-CM | POA: Diagnosis not present

## 2023-12-06 DIAGNOSIS — M25551 Pain in right hip: Secondary | ICD-10-CM | POA: Diagnosis not present

## 2023-12-08 ENCOUNTER — Other Ambulatory Visit: Payer: Self-pay | Admitting: *Deleted

## 2023-12-08 DIAGNOSIS — M7071 Other bursitis of hip, right hip: Secondary | ICD-10-CM | POA: Diagnosis not present

## 2023-12-08 DIAGNOSIS — M810 Age-related osteoporosis without current pathological fracture: Secondary | ICD-10-CM

## 2023-12-08 MED ORDER — DENOSUMAB 60 MG/ML ~~LOC~~ SOSY: 60.0000 mg | PREFILLED_SYRINGE | SUBCUTANEOUS | Status: DC

## 2023-12-12 ENCOUNTER — Other Ambulatory Visit: Payer: Self-pay | Admitting: Family Medicine

## 2023-12-12 DIAGNOSIS — Z1231 Encounter for screening mammogram for malignant neoplasm of breast: Secondary | ICD-10-CM

## 2023-12-20 ENCOUNTER — Ambulatory Visit
Admission: RE | Admit: 2023-12-20 | Discharge: 2023-12-20 | Disposition: A | Discharge status: Home / Self Care | Source: Ambulatory Visit | Attending: Family Medicine | Admitting: Family Medicine

## 2023-12-20 DIAGNOSIS — Z1231 Encounter for screening mammogram for malignant neoplasm of breast: Secondary | ICD-10-CM | POA: Diagnosis not present

## 2023-12-25 DIAGNOSIS — M25561 Pain in right knee: Secondary | ICD-10-CM | POA: Diagnosis not present

## 2023-12-27 ENCOUNTER — Encounter: Payer: Self-pay | Admitting: Family Medicine

## 2024-01-02 DIAGNOSIS — M25561 Pain in right knee: Secondary | ICD-10-CM | POA: Diagnosis not present

## 2024-01-23 NOTE — Telephone Encounter (Signed)
 Waiting on PA/cost info.

## 2024-01-24 ENCOUNTER — Encounter: Payer: Self-pay | Admitting: Family Medicine

## 2024-01-24 ENCOUNTER — Other Ambulatory Visit: Payer: Self-pay | Admitting: Family Medicine

## 2024-01-24 DIAGNOSIS — F419 Anxiety disorder, unspecified: Secondary | ICD-10-CM

## 2024-02-01 ENCOUNTER — Telehealth: Payer: Self-pay

## 2024-02-01 NOTE — Telephone Encounter (Signed)
 Prolia VOB initiated via [email or web address]  Next Prolia inj DUE: 02/22/24

## 2024-02-02 NOTE — Progress Notes (Deleted)
 [facility] [address], [address] [place], [place] [address] [phone]  Date:  02/06/2024   Name:  Melanie Taylor   DOB:  Aug 19, 1966   MRN:  [id number]  PCP:  Kaylee Partridge, MD    Chief Complaint: No chief complaint on file.   History of Present Illness:  Melanie Taylor is a 58 y.o. very pleasant female patient who presents with the following:  Patient seen today with concern of possible hernia.  Most recent visit with myself was last September History of IBS, hyperthyroidism, prediabetes, GERD, anxiety, osteoporosis  I saw her most recently in September of last year Mammograms up-to-date Pap can be updated if she would like Patient Active Problem List   Diagnosis Date Noted   Osteoporosis 06/06/2023   Dyspepsia 07/13/2022   Prediabetes 05/09/2022   IBS (irritable bowel syndrome) 01/02/2016   Hyperthyroidism 05/24/2011   Anxiety 05/24/2011   GERD 04/10/2008    Past Medical History:  Diagnosis Date   Allergy    Arthritis    GERD (gastroesophageal reflux disease)    IBS (irritable bowel syndrome)    Thyroid  disease    hyperthyroidism    Past Surgical History:  Procedure Laterality Date   AUGMENTATION MAMMAPLASTY  11/2004   CESAREAN SECTION  06/2003   one time   COLONOSCOPY     over 10 years ago with Dr Andriette Keeling   KNEE SURGERY  2003   left   SHOULDER SURGERY  2010   right shoulder    Social History   Tobacco Use   Smoking status: Never   Smokeless tobacco: Never  Vaping Use   Vaping status: Never Used  Substance Use Topics   Alcohol use: Yes    Comment: socially   Drug use: No    Family History  Problem Relation Age of Onset   Alcohol abuse Father    Heart attack Father    Sudden death Father    Diabetes Father    Heart disease Father    Arthritis Mother    Hypothyroidism Mother    Hypothyroidism Maternal Grandmother    Cancer Maternal Grandfather        COLON   Colon cancer  Maternal Grandfather    Diabetes Other        GRANDFATHER   Esophageal cancer Neg Hx    Rectal cancer Neg Hx    Stomach cancer Neg Hx     No Known Allergies  Medication list has been reviewed and updated.  Current Outpatient Medications on File Prior to Visit  Medication Sig Dispense Refill   ALPRAZolam  (XANAX ) 0.5 MG tablet TAKE 1 TABLET BY MOUTH THREE TIMES A DAY AS NEEDED FOR ANXIETY OR SLEEP 60 tablet 1   amoxicillin -clavulanate (AUGMENTIN ) 875-125 MG tablet Take 1 tablet by mouth 2 (two) times daily. 20 tablet 0   Estradiol  10 MCG TABS vaginal tablet Place 1 tablet (10 mcg total) vaginally 2 (two) times a week. 8 tablet 2   fexofenadine  (ALLEGRA  ALLERGY) 180 MG tablet Take 1 tablet (180 mg total) by mouth daily for 15 days. 15 tablet 0   fluconazole  (DIFLUCAN ) 150 MG tablet Take 1 tablet (150 mg total) by mouth every 3 (three) days. 2 tablet 0   lansoprazole (PREVACID) 15 MG capsule Take 15 mg by mouth daily at 12 noon.     levocetirizine (XYZAL ) 5 MG tablet TAKE 1 TABLET BY MOUTH EVERY DAY IN THE EVENING  90 tablet 1   levothyroxine (SYNTHROID) 50 MCG tablet Take 50 mcg by mouth every morning.     levothyroxine (SYNTHROID, LEVOTHROID) 25 MCG tablet 75 mcg daily before breakfast.   1   Multiple Vitamin (MULTIVITAMIN) capsule Take 1 capsule by mouth daily.     predniSONE  (DELTASONE ) 50 MG tablet Take 1 tab p.o. daily for 5 days. 5 tablet 0   promethazine -dextromethorphan (PROMETHAZINE -DM) 6.25-15 MG/5ML syrup Take 5 mLs by mouth 2 (two) times daily as needed for cough. 118 mL 0   valACYclovir  (VALTREX ) 500 MG tablet TAKE 4 TABLETS (2GM) ONCE, REPEAT IN 12 HOURS AS NEEDED FOR COLD SORE 30 tablet 3   VITAMIN D  PO Take by mouth.     Current Facility-Administered Medications on File Prior to Visit  Medication Dose Route Frequency Provider Last Rate Last Admin   [START ON 03/01/2024] denosumab  (PROLIA ) injection 60 mg  60 mg Subcutaneous Q6 months Philip Eckersley, Skipper Dumas, MD        Review  of Systems:  As per HPI- otherwise negative.   Physical Examination: There were no vitals filed for this visit. There were no vitals filed for this visit. There is no height or weight on file to calculate BMI. Ideal Body Weight:    GEN: no acute distress. HEENT: Atraumatic, Normocephalic.  Ears and Nose: No external deformity. CV: RRR, No M/G/R. No JVD. No thrill. No extra heart sounds. PULM: CTA B, no wheezes, crackles, rhonchi. No retractions. No resp. distress. No accessory muscle use. ABD: S, NT, ND, +BS. No rebound. No HSM. EXTR: No c/c/e PSYCH: Normally interactive. Conversant.    Assessment and Plan: ***  Signed Gates Kasal, MD

## 2024-02-06 ENCOUNTER — Ambulatory Visit: Admitting: Family Medicine

## 2024-02-08 ENCOUNTER — Other Ambulatory Visit (HOSPITAL_COMMUNITY): Payer: Self-pay

## 2024-02-08 NOTE — Telephone Encounter (Signed)
 Pt ready for scheduling for PROLIA  on or after : 02/22/24  Option# 1: Buy/Bill (Office supplied medication)  Out-of-pocket cost due at time of clinic visit: $0 + DEDUCTIBLE  Number of injection/visits approved: 2  Primary: [employer] Prolia  co-insurance: 0% Admin fee co-insurance: 0%  Secondary: --- Prolia  co-insurance:  Admin fee co-insurance:   Medical Benefit Details: Date Benefits were checked: 02/08/24 Deductible: $1008.36 Met of $6000 Required/ Coinsurance: 0%/ Admin Fee: 0%  Prior Auth: APPROVED PA#  [id number] Expiration Date: 08/08/23-08/07/24  # of doses approved: 2 ----------------------------------------------------------------------- Option# 2- Med Obtained from pharmacy:  Pharmacy benefit: Copay $--- (Paid to pharmacy) Admin Fee: --- (Pay at clinic)  Prior Auth: --- PA# Expiration Date:   # of doses approved:   If patient wants fill through the pharmacy benefit please send prescription to:  [facility] , and include estimated need by date in rx notes. Pharmacy will ship medication directly to the office.  Patient IS eligible for Prolia  Copay Card. Copay Card can make patient's cost as little as $25. Link to apply: [email or web address]://[email or web address]  ** This summary of benefits is an estimation of the patient's out-of-pocket cost. Exact cost may very based on individual plan coverage.

## 2024-02-08 NOTE — Telephone Encounter (Signed)
 Melanie Taylor

## 2024-02-12 NOTE — Progress Notes (Unsigned)
  Healthcare at [facility] [address] [place], [place] [address] [phone]  Date:  02/13/2024   Name:  Melanie Taylor   DOB:  Jan 09, 1966   MRN:  [id number]  PCP:  Kaylee Partridge, MD    Chief Complaint: No chief complaint on file.   History of Present Illness:  Melanie Taylor is a 58 y.o. very pleasant female patient who presents with the following:  Pt seen today with concern of stomach upset and perhaps a hernia Last seen by myself in September  History of IBS, hyperthyroidism, prediabetes, GERD, anxiety, osteoporosis   Patient Active Problem List   Diagnosis Date Noted   Osteoporosis 06/06/2023   Dyspepsia 07/13/2022   Prediabetes 05/09/2022   IBS (irritable bowel syndrome) 01/02/2016   Hyperthyroidism 05/24/2011   Anxiety 05/24/2011   GERD 04/10/2008    Past Medical History:  Diagnosis Date   Allergy    Arthritis    GERD (gastroesophageal reflux disease)    IBS (irritable bowel syndrome)    Thyroid  disease    hyperthyroidism    Past Surgical History:  Procedure Laterality Date   AUGMENTATION MAMMAPLASTY  11/2004   CESAREAN SECTION  06/2003   one time   COLONOSCOPY     over 10 years ago with Dr Andriette Keeling   KNEE SURGERY  2003   left   SHOULDER SURGERY  2010   right shoulder    Social History   Tobacco Use   Smoking status: Never   Smokeless tobacco: Never  Vaping Use   Vaping status: Never Used  Substance Use Topics   Alcohol use: Yes    Comment: socially   Drug use: No    Family History  Problem Relation Age of Onset   Alcohol abuse Father    Heart attack Father    Sudden death Father    Diabetes Father    Heart disease Father    Arthritis Mother    Hypothyroidism Mother    Hypothyroidism Maternal Grandmother    Cancer Maternal Grandfather        COLON   Colon cancer Maternal Grandfather    Diabetes Other        GRANDFATHER   Esophageal cancer Neg Hx    Rectal cancer Neg Hx     Stomach cancer Neg Hx     No Known Allergies  Medication list has been reviewed and updated.  Current Outpatient Medications on File Prior to Visit  Medication Sig Dispense Refill   ALPRAZolam  (XANAX ) 0.5 MG tablet TAKE 1 TABLET BY MOUTH THREE TIMES A DAY AS NEEDED FOR ANXIETY OR SLEEP 60 tablet 1   amoxicillin -clavulanate (AUGMENTIN ) 875-125 MG tablet Take 1 tablet by mouth 2 (two) times daily. 20 tablet 0   Estradiol  10 MCG TABS vaginal tablet Place 1 tablet (10 mcg total) vaginally 2 (two) times a week. 8 tablet 2   fexofenadine  (ALLEGRA  ALLERGY) 180 MG tablet Take 1 tablet (180 mg total) by mouth daily for 15 days. 15 tablet 0   fluconazole  (DIFLUCAN ) 150 MG tablet Take 1 tablet (150 mg total) by mouth every 3 (three) days. 2 tablet 0   lansoprazole (PREVACID) 15 MG capsule Take 15 mg by mouth daily at 12 noon.     levocetirizine (XYZAL ) 5 MG tablet TAKE 1 TABLET BY MOUTH EVERY DAY IN THE EVENING 90 tablet 1   levothyroxine (SYNTHROID) 50 MCG tablet Take 50 mcg by mouth every morning.  levothyroxine (SYNTHROID, LEVOTHROID) 25 MCG tablet 75 mcg daily before breakfast.   1   Multiple Vitamin (MULTIVITAMIN) capsule Take 1 capsule by mouth daily.     predniSONE  (DELTASONE ) 50 MG tablet Take 1 tab p.o. daily for 5 days. 5 tablet 0   promethazine -dextromethorphan (PROMETHAZINE -DM) 6.25-15 MG/5ML syrup Take 5 mLs by mouth 2 (two) times daily as needed for cough. 118 mL 0   valACYclovir  (VALTREX ) 500 MG tablet TAKE 4 TABLETS (2GM) ONCE, REPEAT IN 12 HOURS AS NEEDED FOR COLD SORE 30 tablet 3   VITAMIN D  PO Take by mouth.     Current Facility-Administered Medications on File Prior to Visit  Medication Dose Route Frequency Provider Last Rate Last Admin   [START ON 03/01/2024] denosumab  (PROLIA ) injection 60 mg  60 mg Subcutaneous Q6 months Jilliane Kazanjian, Skipper Dumas, MD        Review of Systems:  As per HPI- otherwise negative.   Physical Examination: There were no vitals filed for this  visit. There were no vitals filed for this visit. There is no height or weight on file to calculate BMI. Ideal Body Weight:    GEN: no acute distress. HEENT: Atraumatic, Normocephalic.  Ears and Nose: No external deformity. CV: RRR, No M/G/R. No JVD. No thrill. No extra heart sounds. PULM: CTA B, no wheezes, crackles, rhonchi. No retractions. No resp. distress. No accessory muscle use. ABD: S, NT, ND, +BS. No rebound. No HSM. EXTR: No c/c/e PSYCH: Normally interactive. Conversant.    Assessment and Plan: ***  Signed Gates Kasal, MD

## 2024-02-13 ENCOUNTER — Other Ambulatory Visit (HOSPITAL_COMMUNITY): Payer: Self-pay

## 2024-02-13 ENCOUNTER — Encounter: Payer: Self-pay | Admitting: Family Medicine

## 2024-02-13 ENCOUNTER — Ambulatory Visit (INDEPENDENT_AMBULATORY_CARE_PROVIDER_SITE_OTHER): Admitting: Family Medicine

## 2024-02-13 VITALS — BP 114/82 | HR 67 | Ht 62.0 in | Wt 118.6 lb

## 2024-02-13 DIAGNOSIS — R1111 Vomiting without nausea: Secondary | ICD-10-CM

## 2024-02-13 DIAGNOSIS — R7303 Prediabetes: Secondary | ICD-10-CM | POA: Diagnosis not present

## 2024-02-13 DIAGNOSIS — K439 Ventral hernia without obstruction or gangrene: Secondary | ICD-10-CM | POA: Diagnosis not present

## 2024-02-13 NOTE — Patient Instructions (Addendum)
 It was good to see you today- please keep me posted about your progress We will get labs and set up a gallbladder ultrasound I will reach out to Dr Willy Harvest about next steps for eval of possible gastroparesis   For the time being eat small frequent meals to try and minimize your symptoms

## 2024-02-14 ENCOUNTER — Encounter: Payer: Self-pay | Admitting: Family Medicine

## 2024-02-14 ENCOUNTER — Ambulatory Visit (HOSPITAL_BASED_OUTPATIENT_CLINIC_OR_DEPARTMENT_OTHER)
Admission: RE | Admit: 2024-02-14 | Discharge: 2024-02-14 | Disposition: A | Discharge status: Home / Self Care | Source: Ambulatory Visit | Attending: Family Medicine | Admitting: Family Medicine

## 2024-02-14 ENCOUNTER — Other Ambulatory Visit (HOSPITAL_COMMUNITY): Payer: Self-pay

## 2024-02-14 DIAGNOSIS — R1011 Right upper quadrant pain: Secondary | ICD-10-CM | POA: Diagnosis not present

## 2024-02-14 DIAGNOSIS — R1111 Vomiting without nausea: Secondary | ICD-10-CM | POA: Diagnosis not present

## 2024-02-14 DIAGNOSIS — R1110 Vomiting, unspecified: Secondary | ICD-10-CM | POA: Diagnosis not present

## 2024-02-14 LAB — COMPREHENSIVE METABOLIC PANEL WITH GFR
ALT: 11 U/L (ref 0–35)
AST: 19 U/L (ref 0–37)
Albumin: 4 g/dL (ref 3.5–5.2)
Alkaline Phosphatase: 67 U/L (ref 39–117)
BUN: 10 mg/dL (ref 6–23)
CO2: 27 meq/L (ref 19–32)
Calcium: 8.8 mg/dL (ref 8.4–10.5)
Chloride: 102 meq/L (ref 96–112)
Creatinine, Ser: 0.82 mg/dL (ref 0.40–1.20)
GFR: 80.5 mL/min (ref >60.00)
Glucose, Bld: 87 mg/dL (ref 70–99)
Potassium: 4.1 meq/L (ref 3.5–5.1)
Sodium: 137 meq/L (ref 135–145)
Total Bilirubin: 0.4 mg/dL (ref 0.2–1.2)
Total Protein: 6.2 g/dL (ref 6.0–8.3)

## 2024-02-14 LAB — CBC
HCT: 40.3 % (ref 36.0–46.0)
Hemoglobin: 13.4 g/dL (ref 12.0–15.0)
MCHC: 33.2 g/dL (ref 30.0–36.0)
MCV: 90.1 fl (ref 78.0–100.0)
Platelets: 312 10*3/uL (ref 150.0–400.0)
RBC: 4.48 Mil/uL (ref 3.87–5.11)
RDW: 13.4 % (ref 11.5–15.5)
WBC: 5.3 10*3/uL (ref 4.0–10.5)

## 2024-02-14 LAB — LIPASE: Lipase: 19 U/L (ref 11.0–59.0)

## 2024-02-14 LAB — HEMOGLOBIN A1C: Hgb A1c MFr Bld: 5.8 % (ref 4.6–6.5)

## 2024-02-14 NOTE — Telephone Encounter (Signed)
 Called [employer] to check on pharmacy rejection. Active PA is through the medical benefit. Plan will not cover Prolia  through the pharmacy benefit.

## 2024-02-15 ENCOUNTER — Telehealth: Payer: Self-pay | Admitting: Internal Medicine

## 2024-02-15 NOTE — Telephone Encounter (Signed)
 Please schedule appointment w/ me or an APP to be seen re: persistent vomiting - NL RUQ Us  and labs

## 2024-02-15 NOTE — Telephone Encounter (Signed)
 Christy see note regarding appt. Thank you

## 2024-02-20 ENCOUNTER — Encounter: Payer: Self-pay | Admitting: Gastroenterology

## 2024-02-20 ENCOUNTER — Ambulatory Visit (INDEPENDENT_AMBULATORY_CARE_PROVIDER_SITE_OTHER): Admitting: Gastroenterology

## 2024-02-20 VITALS — BP 100/72 | HR 80 | Ht 61.0 in | Wt 119.1 lb

## 2024-02-20 DIAGNOSIS — R6881 Early satiety: Secondary | ICD-10-CM | POA: Insufficient documentation

## 2024-02-20 DIAGNOSIS — K219 Gastro-esophageal reflux disease without esophagitis: Secondary | ICD-10-CM

## 2024-02-20 DIAGNOSIS — R112 Nausea with vomiting, unspecified: Secondary | ICD-10-CM | POA: Insufficient documentation

## 2024-02-20 NOTE — Progress Notes (Signed)
 02/20/2024 CLOVA GOYNE [id number] 1966-01-06   HISTORY OF PRESENT ILLNESS:  This is a 58 year old female who is a patient of Dr. Jadene Maxwell.  She is a follows here for colonoscopy and for her reflux.  Takes Prevacid 15 mg OTC daily and sometimes Pepcid as needed.  She presents here today though with complaints of intermittent episodes of nausea and vomiting.  Gets full quickly, never eats 3 meals a day, is more of a grazer.  During these episodes she vomits undigested food.  Feels like it has probably happened 5 times this year or so, maybe once a month.  She does have longstanding acid reflux.  Last EGD 2009.  She has a small ventral hernia between her epigastrium and her umbilicus and wanted to discuss and assess to be sure we did not think that was contributing to her symptoms.  She thinks it actually popped up because she stopped going to the gym back in the fall and actually was noticed again after an episode of retching with this vomiting that had occurred.  Once the vomiting episodes occur she feels much better.  Past Medical History:  Diagnosis Date   Allergy    Arthritis    GERD (gastroesophageal reflux disease)    IBS (irritable bowel syndrome)    Thyroid  disease    hyperthyroidism   Past Surgical History:  Procedure Laterality Date   AUGMENTATION MAMMAPLASTY  11/2004   CESAREAN SECTION  06/2003   one time   COLONOSCOPY     over 10 years ago with Dr Andriette Keeling   KNEE SURGERY  2003   left   SHOULDER SURGERY  2010   right shoulder    reports that she has never smoked. She has never used smokeless tobacco. She reports current alcohol use. She reports that she does not use drugs. family history includes Alcohol abuse in her father; Arthritis in her mother; Cancer in her maternal grandfather; Colon cancer in her maternal grandfather; Diabetes in her father and another family member; Heart attack in her father; Heart disease in her father; Hypothyroidism in her maternal  grandmother and mother; Sudden death in her father. No Known Allergies    Outpatient Encounter Medications as of 02/20/2024  Medication Sig   ALPRAZolam  (XANAX ) 0.5 MG tablet TAKE 1 TABLET BY MOUTH THREE TIMES A DAY AS NEEDED FOR ANXIETY OR SLEEP   Ascorbic Acid (VITAMIN C) 1000 MG tablet Take 1,000 mg by mouth daily.   celecoxib (CELEBREX) 100 MG capsule Take 100 mg by mouth as needed.   estradiol  (ESTRACE ) 0.1 MG/GM vaginal cream Place 1 Applicatorful vaginally at bedtime.   lansoprazole (PREVACID) 15 MG capsule Take 15 mg by mouth daily at 12 noon.   levothyroxine (SYNTHROID) 50 MCG tablet Take 50 mcg by mouth every morning.   Multiple Vitamin (MULTIVITAMIN) capsule Take 1 capsule by mouth daily.   Multiple Vitamins-Minerals (ALGAE BASED CALCIUM PO) Take 2 capsules by mouth 2 (two) times daily.   Omega-3 Fatty Acids (OMEGA 3 PO) Take 1 capsule by mouth daily.   progesterone  (PROMETRIUM ) 100 MG capsule Take 200 mg by mouth at bedtime.   valACYclovir  (VALTREX ) 500 MG tablet TAKE 4 TABLETS (2GM) ONCE, REPEAT IN 12 HOURS AS NEEDED FOR COLD SORE   Vitamin D -Vitamin K (VITAMIN K2-VITAMIN D3 PO) Take 1 capsule by mouth as needed.   fexofenadine  (ALLEGRA  ALLERGY) 180 MG tablet Take 1 tablet (180 mg total) by mouth daily for 15 days.   [DISCONTINUED] amoxicillin -clavulanate (AUGMENTIN )  875-125 MG tablet Take 1 tablet by mouth 2 (two) times daily. (Patient not taking: Reported on 02/13/2024)   [DISCONTINUED] Estradiol  10 MCG TABS vaginal tablet Place 1 tablet (10 mcg total) vaginally 2 (two) times a week.   [DISCONTINUED] fluconazole  (DIFLUCAN ) 150 MG tablet Take 1 tablet (150 mg total) by mouth every 3 (three) days. (Patient not taking: Reported on 02/13/2024)   [DISCONTINUED] levocetirizine (XYZAL ) 5 MG tablet TAKE 1 TABLET BY MOUTH EVERY DAY IN THE EVENING   [DISCONTINUED] levothyroxine (SYNTHROID, LEVOTHROID) 25 MCG tablet 75 mcg daily before breakfast.    [DISCONTINUED] predniSONE  (DELTASONE ) 50 MG  tablet Take 1 tab p.o. daily for 5 days. (Patient not taking: Reported on 02/13/2024)   [DISCONTINUED] promethazine -dextromethorphan (PROMETHAZINE -DM) 6.25-15 MG/5ML syrup Take 5 mLs by mouth 2 (two) times daily as needed for cough. (Patient not taking: Reported on 02/13/2024)   [DISCONTINUED] VITAMIN D  PO Take by mouth.   Facility-Administered Encounter Medications as of 02/20/2024  Medication   [START ON 03/01/2024] denosumab  (PROLIA ) injection 60 mg     REVIEW OF SYSTEMS  : All other systems reviewed and negative except where noted in the History of Present Illness.   PHYSICAL EXAM: BP 100/72 (BP Location: Left Arm, Patient Position: Sitting, Cuff Size: Normal)   Pulse 80   Ht 5\' 1"  (1.549 m) Comment: height measured without shoes  Wt 119 lb 2 oz (54 kg)   LMP 08/26/2016   BMI 22.51 kg/m  General: Well developed white female in no acute distress Head: Normocephalic and atraumatic Eyes:  Sclerae anicteric, conjunctiva pink. Ears: Normal auditory acuity Lungs: Clear throughout to auscultation; no W/R/R. Heart: Regular rate and rhythm; no M/R/G. Abdomen: Soft, non-distended.  BS present.  Non-tender.  Small ventral hernia noted. Musculoskeletal: Symmetrical with no gross deformities  Skin: No lesions on visible extremities Neurological: Alert oriented x 4, grossly non-focal Psychological:  Alert and cooperative. Normal mood and affect  ASSESSMENT AND PLAN: *58 year old female with complaints of intermittent episodes of nausea and vomiting.  Gets full quickly, never eats 3 meals a day, is more of a grazer.  During these episodes she vomits undigested food.  She does have longstanding acid reflux.  Last EGD 2009.  Is on Prevacid 15 mg OTC daily.  Could have gastroparesis although would be unexplained.  Will perform EGD first to rule out any structural issues that could be causing intermittent/partial gastric outlet obstruction.  If that looks okay then proceed with gastric emptying scan.   Will continue Prevacid 15 mg daily.  Will eat small frequent meals, etc.  Scheduled with Dr. Willy Harvest.  The risks, benefits, and alternatives to EGD were discussed with the patient and she consents to proceed.   CC:  Copland, Skipper Dumas, MD

## 2024-02-20 NOTE — Patient Instructions (Signed)
 You have been scheduled for an endoscopy. Please follow written instructions given to you at your visit today.  If you use inhalers (even only as needed), please bring them with you on the day of your procedure.  If you take any of the following medications, they will need to be adjusted prior to your procedure:   DO NOT TAKE 7 DAYS PRIOR TO TEST- Trulicity (dulaglutide) Ozempic, Wegovy (semaglutide) Mounjaro (tirzepatide) Bydureon Bcise (exanatide extended release)  DO NOT TAKE 1 DAY PRIOR TO YOUR TEST Rybelsus (semaglutide) Adlyxin (lixisenatide) Victoza (liraglutide) Byetta (exanatide) __________________________________________________________________  _______________________________________________________  If your blood pressure at your visit was 140/90 or greater, please contact your primary care physician to follow up on this.  _______________________________________________________  If you are age 71 or older, your body mass index should be between 23-30. Your Body mass index is 22.51 kg/m. If this is out of the aforementioned range listed, please consider follow up with your Primary Care Provider.  If you are age 32 or younger, your body mass index should be between 19-25. Your Body mass index is 22.51 kg/m. If this is out of the aformentioned range listed, please consider follow up with your Primary Care Provider.   ________________________________________________________  The [facility] providers would like to encourage you to use MYCHART to communicate with providers for non-urgent requests or questions.  Due to long hold times on the telephone, sending your provider a message by [facility] may be a faster and more efficient way to get a response.  Please allow 48 business hours for a response.  Please remember that this is for non-urgent requests.  _______________________________________________________

## 2024-02-21 ENCOUNTER — Encounter: Admitting: Family Medicine

## 2024-02-29 ENCOUNTER — Ambulatory Visit (AMBULATORY_SURGERY_CENTER): Admitting: Internal Medicine

## 2024-02-29 ENCOUNTER — Encounter: Payer: Self-pay | Admitting: Internal Medicine

## 2024-02-29 VITALS — BP 100/64 | HR 63 | Temp 97.5°F | Resp 12 | Ht 61.0 in | Wt 120.0 lb

## 2024-02-29 DIAGNOSIS — R6881 Early satiety: Secondary | ICD-10-CM

## 2024-02-29 DIAGNOSIS — B9681 Helicobacter pylori [H. pylori] as the cause of diseases classified elsewhere: Secondary | ICD-10-CM

## 2024-02-29 DIAGNOSIS — K2950 Unspecified chronic gastritis without bleeding: Secondary | ICD-10-CM

## 2024-02-29 DIAGNOSIS — K317 Polyp of stomach and duodenum: Secondary | ICD-10-CM

## 2024-02-29 DIAGNOSIS — K219 Gastro-esophageal reflux disease without esophagitis: Secondary | ICD-10-CM

## 2024-02-29 DIAGNOSIS — R112 Nausea with vomiting, unspecified: Secondary | ICD-10-CM

## 2024-02-29 MED ORDER — SODIUM CHLORIDE 0.9 % IV SOLN: 500.0000 mL | Freq: Once | INTRAVENOUS | Status: DC

## 2024-02-29 NOTE — Patient Instructions (Addendum)
 No cause of symptoms clearly seen. I did take biopsies to check for gastritis and stomach infection. Also saw inflammation in the esophagus likely from reflux vs. vomiting or both.  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 These do not cause problems.  Once I see biopsy results will communicate next steps.  I appreciate the opportunity to care for you. Kenney Peacemaker, MD, FACG  YOU HAD AN ENDOSCOPIC PROCEDURE TODAY AT THE [facility]:   Refer to the procedure report that was given to you for any specific questions about what was found during the examination.  If the procedure report does not answer your questions, please call your gastroenterologist to clarify.  If you requested that your care partner not be given the details of your procedure findings, then the procedure report has been included in a sealed envelope for you to review at your convenience later.  YOU SHOULD EXPECT: Some feelings of bloating in the abdomen. Passage of more gas than usual.  Walking can help get rid of the air that was put into your GI tract during the procedure and reduce the bloating. If you had a lower endoscopy (such as a colonoscopy or flexible sigmoidoscopy) you may notice spotting of blood in your stool or on the toilet paper. If you underwent a bowel prep for your procedure, you may not have a normal bowel movement for a few days.  Please Note:  You might notice some irritation and congestion in your nose or some drainage.  This is from the oxygen used during your procedure.  There is no need for concern and it should clear up in a day or so.  SYMPTOMS TO REPORT IMMEDIATELY:  Following lower endoscopy (colonoscopy or flexible sigmoidoscopy):  Excessive amounts of blood in the stool  Significant tenderness or worsening of abdominal pains  Swelling of the abdomen that is new, acute  Fever of 100F or higher  Following upper endoscopy (EGD)  Vomiting of blood  or coffee ground material  New chest pain or pain under the shoulder blades  Painful or persistently difficult swallowing  New shortness of breath  Fever of 100F or higher  Black, tarry-looking stools  For urgent or emergent issues, a gastroenterologist can be reached at any hour by calling [phone]. Do not use MyChart messaging for urgent concerns.    DIET:  We do recommend a small meal at first, but then you may proceed to your regular diet.  Drink plenty of fluids but you should avoid alcoholic beverages for 24 hours.  ACTIVITY:  You should plan to take it easy for the rest of today and you should NOT DRIVE or use heavy machinery until tomorrow (because of the sedation medicines used during the test).    FOLLOW UP: Our staff will call the number listed on your records the next business day following your procedure.  We will call around 7:15- 8:00 am to check on you and address any questions or concerns that you may have regarding the information given to you following your procedure. If we do not reach you, we will leave a message.     If any biopsies were taken you will be contacted by phone or by letter within the next 1-3 weeks.  Please call us  at [phone] if you have not heard about the biopsies in 3 weeks.    SIGNATURES/CONFIDENTIALITY: You and/or your care partner have signed paperwork which will be entered into  your electronic medical record.  These signatures attest to the fact that that the information above on your After Visit Summary has been reviewed and is understood.  Full responsibility of the confidentiality of this discharge information lies with you and/or your care-partner.

## 2024-02-29 NOTE — Progress Notes (Signed)
 Called to room to assist during endoscopic procedure.  Patient ID and intended procedure confirmed with present staff. Received instructions for my participation in the procedure from the performing physician.

## 2024-02-29 NOTE — Op Note (Addendum)
 [facility] Patient Name: Melanie Taylor Procedure Date: 02/29/2024 9:40 AM MRN: [id number] Endoscopist: Kenney Peacemaker , MD, [id number] Age: 58 Referring MD:  Date of Birth: 12-27-1965 Gender: Female Account #: [id number] Procedure:                Upper GI endoscopy Indications:              Esophageal reflux, Early satiety, Nausea with                            vomiting - post-prandial intermittent,                            partially/undisgested food. Also "stomach ache"                            precedes. RUQ US  normal as are labs. Symptoms began                            11/2023. Medicines:                Monitored Anesthesia Care Procedure:                Pre-Anesthesia Assessment:                           - Prior to the procedure, a History and Physical                            was performed, and patient medications and                            allergies were reviewed. The patient's tolerance of                            previous anesthesia was also reviewed. The risks                            and benefits of the procedure and the sedation                            options and risks were discussed with the patient.                            All questions were answered, and informed consent                            was obtained. Prior Anticoagulants: The patient has                            taken no anticoagulant or antiplatelet agents. ASA                            Grade Assessment: II - A patient with mild systemic  disease. After reviewing the risks and benefits,                            the patient was deemed in satisfactory condition to                            undergo the procedure.                           After obtaining informed consent, the endoscope was                            passed under direct vision. Throughout the                            procedure, the patient's blood pressure, pulse, and                             oxygen saturations were monitored continuously. The                            GIF [id number] #[id number] was introduced through the                            mouth, and advanced to the second part of duodenum.                            The upper GI endoscopy was accomplished without                            difficulty. The patient tolerated the procedure                            well. Scope In: Scope Out: Findings:                 LA Grade A (one or more mucosal breaks less than 5                            mm, not extending between tops of 2 mucosal folds)                            esophagitis with no bleeding was found in the                            distal esophagus. Biopsies were taken with a cold                            forceps for histology. Verification of patient                            identification for the specimen was done. Estimated  blood loss was minimal.                           The Z-line was irregular and was found 35 cm from                            the incisors.                           Multiple small sessile polyps were found in the                            gastric fundus and in the gastric body.                           Patchy mildly erythematous mucosa without bleeding                            was found in the gastric antrum. Biopsies were                            taken with a cold forceps for histology.                            Verification of patient identification for the                            specimen was done. Estimated blood loss was minimal.                           The exam was otherwise without abnormality.                           The cardia and gastric fundus were normal on                            retroflexion.                           Biopsies were taken with a cold forceps in the                            gastric body for histology. Verification of patient                             identification for the specimen was done. Estimated                            blood loss was minimal. Complications:            No immediate complications. Estimated Blood Loss:     Estimated blood loss was minimal. Impression:               - LA Grade A reflux esophagitis with no bleeding.  Biopsied.                           - Z-line irregular, 35 cm from the incisors.                           - Multiple gastric polyps.                           - Erythematous mucosa in the antrum. Biopsied.                           - The examination was otherwise normal.                           - Biopsies were taken with a cold forceps for                            histology in the gastric body. Recommendation:           - Patient has a contact number available for                            emergencies. The signs and symptoms of potential                            delayed complications were discussed with the                            patient. Return to normal activities tomorrow.                            Written discharge instructions were provided to the                            patient.                           - Resume previous diet.                           - Continue present medications.                           - Await pathology results. Cause of nausea and                            vomiting, early satiety not clear. Anticipate at                            least increasing PPI dose (on lansoprazole 15 mg                            daily). Consider gastric emptying study (if  pathology w/o etiology will do this next per                            conversation in recovery). Consider role of                            increased stressors. She has not lost weight. Kenney Peacemaker, MD 02/29/2024 10:17:15 AM This report has been signed electronically.

## 2024-02-29 NOTE — Progress Notes (Signed)
 Udated medical record

## 2024-02-29 NOTE — Progress Notes (Signed)
 Report to PACU, RN, vss, BBS= Clear.

## 2024-02-29 NOTE — Progress Notes (Signed)
 History and Physical Interval Note:  02/29/2024 9:49 AM  Melanie Taylor  has presented today for endoscopic procedure(s), with the diagnosis of  Encounter Diagnoses  Name Primary?   Gastroesophageal reflux disease, unspecified whether esophagitis present Yes   Early satiety    Nausea and vomiting, unspecified vomiting type   .  The various methods of evaluation and treatment have been discussed with the patient and/or family. After consideration of risks, benefits and other options for treatment, the patient has consented to  the endoscopic procedure(s).   The patient's history has been reviewed, patient examined, no change in status, stable for endoscopic procedure(s).  I have reviewed the patient's chart and labs.  Questions were answered to the patient's satisfaction.    Wt Readings from Last 3 Encounters:  02/29/24 120 lb (54.4 kg)  02/20/24 119 lb 2 oz (54 kg)  02/13/24 118 lb 9.6 oz (53.8 kg)    Kenney Peacemaker, MD, [facility]

## 2024-03-01 ENCOUNTER — Telehealth: Payer: Self-pay

## 2024-03-01 NOTE — Telephone Encounter (Signed)
  Follow up Call-     02/29/2024    9:17 AM  Call back number  Post procedure Call Back phone  # [phone]  Permission to leave phone message Yes     Patient questions:  Do you have a fever, pain , or abdominal swelling? No. Pain Score  0 *  Have you tolerated food without any problems? Yes.    Have you been able to return to your normal activities? Yes.    Do you have any questions about your discharge instructions: Diet   No. Medications  No. Follow up visit  No.  Do you have questions or concerns about your Care? No.  Actions: * If pain score is 4 or above: No action needed, pain <4.

## 2024-03-02 LAB — SURGICAL PATHOLOGY

## 2024-03-05 ENCOUNTER — Ambulatory Visit: Payer: Self-pay | Admitting: Internal Medicine

## 2024-03-05 ENCOUNTER — Telehealth: Payer: Self-pay | Admitting: Internal Medicine

## 2024-03-05 DIAGNOSIS — B9681 Helicobacter pylori [H. pylori] as the cause of diseases classified elsewhere: Secondary | ICD-10-CM | POA: Insufficient documentation

## 2024-03-05 NOTE — Telephone Encounter (Signed)
 Please inform patient stomach biopsies show thatshe has H pylori infection of stomach and needs treatment as below.  Hopefully treatment will fix her problems. If it does not she should let us  know.  1) Omeprazole  20 mg 2 times a day x 14 d (hold OTC lansoprazole while on this) 2) Pepto Bismol 2 tabs (262 mg each) 4 times a day x 14 d 3) Metronidazole 250 mg 4 times a day x 14 d 4) tetracycline 500 mg 4 times a day x 14 d  Once this is done can go back on OTC lansoprazole to control heartburn if she thinks she needs it    In 4 weeks after treatment completed do Diatherex H. Pylori stool antigen - dx H. Pylori gastritis

## 2024-03-07 ENCOUNTER — Encounter: Payer: Self-pay | Admitting: Internal Medicine

## 2024-03-07 NOTE — Telephone Encounter (Signed)
 Dr Willy Harvest I see a note on 5/26 but it was not sent to anyone.  Are there any other recommendations?

## 2024-03-08 MED ORDER — BISMUTH SUBSALICYLATE 262 MG PO TABS: 2.0000 | ORAL_TABLET | Freq: Four times a day (QID) | ORAL | 0 refills | Status: AC

## 2024-03-08 MED ORDER — TETRACYCLINE HCL 500 MG PO CAPS
500.0000 mg | ORAL_CAPSULE | Freq: Four times a day (QID) | ORAL | 0 refills | Status: AC
Start: 2024-03-08 — End: 2024-03-22

## 2024-03-08 MED ORDER — METRONIDAZOLE 250 MG PO TABS: 250.0000 mg | ORAL_TABLET | Freq: Four times a day (QID) | ORAL | 0 refills | Status: AC

## 2024-03-08 MED ORDER — OMEPRAZOLE 20 MG PO CPDR: 20.0000 mg | DELAYED_RELEASE_CAPSULE | Freq: Two times a day (BID) | ORAL | 0 refills | Status: DC

## 2024-03-08 NOTE — Telephone Encounter (Signed)
 I have sent the prescription to the pharmacy for treatment.   [facility] order has been entered   All information has been sent to the pt via My Chart.  She does view messages

## 2024-03-19 ENCOUNTER — Encounter: Payer: Self-pay | Admitting: Family Medicine

## 2024-03-26 NOTE — Progress Notes (Signed)
 [facility] [address], [address] [place], [place] [address] [phone]  Date:  04/02/2024   Name:  Melanie Taylor   DOB:  [birth year]   MRN:  [id number]  PCP:  Watt Harlene BROCKS, MD    Chief Complaint: Annual Exam   History of Present Illness:  Melanie Taylor is a 58 y.o. very pleasant female patient who presents with the following:  Pt seen today for CPE Last seen by myself last month for a sick visit and concern about ventral hernia History of IBS, hyperthyroidism, prediabetes, GERD, anxiety, osteoporosis   We had her see GI on 5/12- they did an EGD for her which looked ok but she did test + for H pylori- she was treated.  Just finished the abx which were giving her diarrhea.  She is not sure if the GRED is better yet- she is still on the prevacid which she has taken for years She is no longer vomiting  Admits she is under some stress, her parents are elderly and having some issues Lab Results  Component Value Date   HGBA1C 5.8 02/13/2024   Lab Results  Component Value Date   TSH 2.40 07/04/2023   Mammo UTD Pap UTD but we can update if she would like- she prefers to do next year.  Never had an abnormal In July 2022 she had a normal Pap, negative HPV Colon 2020  Xanax  prn Celebrex  Vaginal estrogen, prometrium   Prevacid Synthroid She is not on meds for osteoporosis - she is trying to work on this with diet and exercise.  She would like to rescan next year  However she notes a more difficult time exercising recently due to a right hamstring injury-she has been seen at orthopedics for this problem  Patient Active Problem List   Diagnosis Date Noted   Helicobacter pylori gastritis 03/05/2024   Early satiety 02/20/2024   Nausea and vomiting 02/20/2024   Osteoporosis 06/06/2023   Dyspepsia 07/13/2022   Prediabetes 05/09/2022   IBS (irritable bowel syndrome) 01/02/2016   Hyperthyroidism 05/24/2011   Anxiety  05/24/2011   GERD 04/10/2008    Past Medical History:  Diagnosis Date   Allergy    Arthritis    GERD (gastroesophageal reflux disease)    IBS (irritable bowel syndrome)    Osteoporosis    Thyroid  disease    hyperthyroidism    Past Surgical History:  Procedure Laterality Date   AUGMENTATION MAMMAPLASTY  11/2004   CESAREAN SECTION  06/2003   one time   COLONOSCOPY     over 10 years ago with Dr Luis   COLONOSCOPY WITH PROPOFOL   2020   [facility]   KNEE SURGERY  2003   left   SHOULDER SURGERY  2010   right shoulder   SHOULDER SURGERY Left 2021   UPPER GASTROINTESTINAL ENDOSCOPY      Social History   Tobacco Use   Smoking status: Never   Smokeless tobacco: Never  Vaping Use   Vaping status: Never Used  Substance Use Topics   Alcohol use: Yes    Comment: socially   Drug use: No    Family History  Problem Relation Age of Onset   Alcohol abuse Father    Heart attack Father    Sudden death Father    Diabetes Father    Heart disease Father    Arthritis Mother    Hypothyroidism Mother    Hypothyroidism Maternal Grandmother  Cancer Maternal Grandfather        COLON   Colon cancer Maternal Grandfather    Diabetes Other        GRANDFATHER   Esophageal cancer Neg Hx    Rectal cancer Neg Hx    Stomach cancer Neg Hx     No Known Allergies  Medication list has been reviewed and updated.  Current Outpatient Medications on File Prior to Visit  Medication Sig Dispense Refill   ALPRAZolam  (XANAX ) 0.5 MG tablet TAKE 1 TABLET BY MOUTH THREE TIMES A DAY AS NEEDED FOR ANXIETY OR SLEEP 60 tablet 1   Ascorbic Acid (VITAMIN C) 1000 MG tablet Take 1,000 mg by mouth daily.     celecoxib (CELEBREX) 100 MG capsule Take 100 mg by mouth as needed.     estradiol  (ESTRACE ) 0.1 MG/GM vaginal cream Place 1 Applicatorful vaginally at bedtime.     lansoprazole (PREVACID) 15 MG capsule Take 15 mg by mouth daily at 12 noon.     levothyroxine (SYNTHROID) 50 MCG tablet Take 50 mcg by  mouth every morning.     loratadine (CLARITIN) 10 MG tablet Take 10 mg by mouth daily.     Multiple Vitamin (MULTIVITAMIN) capsule Take 1 capsule by mouth daily.     Multiple Vitamins-Minerals (ALGAE BASED CALCIUM PO) Take 2 capsules by mouth 2 (two) times daily.     Omega-3 Fatty Acids (OMEGA 3 PO) Take 1 capsule by mouth daily.     progesterone  (PROMETRIUM ) 100 MG capsule Take 200 mg by mouth at bedtime.     valACYclovir  (VALTREX ) 500 MG tablet TAKE 4 TABLETS (2GM) ONCE, REPEAT IN 12 HOURS AS NEEDED FOR COLD SORE 30 tablet 3   Vitamin D -Vitamin K (VITAMIN K2-VITAMIN D3 PO) Take 1 capsule by mouth as needed.     fexofenadine  (ALLEGRA  ALLERGY) 180 MG tablet Take 1 tablet (180 mg total) by mouth daily for 15 days. (Patient not taking: Reported on 04/02/2024) 15 tablet 0   omeprazole  (PRILOSEC) 20 MG capsule Take 1 capsule (20 mg total) by mouth 2 (two) times daily before a meal for 14 days. (Patient not taking: Reported on 04/02/2024) 28 capsule 0   No current facility-administered medications on file prior to visit.    Review of Systems:  As per HPI- otherwise negative.   Physical Examination: Vitals:   04/02/24 0928  BP: 110/70  Pulse: (!) 58  SpO2: 97%   Vitals:   04/02/24 0928  Weight: 120 lb 9.6 oz (54.7 kg)  Height: 5' 1 (1.549 m)   Body mass index is 22.79 kg/m. Ideal Body Weight: Weight in (lb) to have BMI = 25: 132   GEN: no acute distress. Normal weight, looks well  HEENT: Atraumatic, Normocephalic.   Bilateral TM wnl, oropharynx normal.  PEERL,EOMI.   Ears and Nose: No external deformity. CV: RRR, No M/G/R. No JVD. No thrill. No extra heart sounds. PULM: CTA B, no wheezes, crackles, rhonchi. No retractions. No resp. distress. No accessory muscle use. ABD: S, NT, ND,. No rebound. No HSM. EXTR: No c/c/e PSYCH: Normally interactive. Conversant.   Wt Readings from Last 3 Encounters:  04/02/24 120 lb 9.6 oz (54.7 kg)  02/29/24 120 lb (54.4 kg)  02/20/24 119 lb 2 oz  (54 kg)    Assessment and Plan: Physical exam  Prediabetes  Osteoporosis, unspecified osteoporosis type, unspecified pathological fracture presence  Thyroid  disorder screening - Plan: TSH  Screening, lipid - Plan: Lipid panel  Acquired hypothyroidism - Plan: TSH  Physical exam today- encouraged healthy diet and exercise routine Will plan further follow- up pending labs. We discussed osteoporosis, unfortunately it is difficult for her to do weightbearing exercise right now due to her leg.  She may try to add some swimming and water aerobics just for general health.  We plan to repeat a bone density scan next year  Signed Harlene Schroeder, MD  Received labs as below, message to patient  Results for orders placed or performed in visit on 04/02/24  Lipid panel   Collection Time: 04/02/24 10:17 AM  Result Value Ref Range   Cholesterol 193 0 - 200 mg/dL   Triglycerides 36.9 0.0 - 149.0 mg/dL   HDL 43.69 >60.99 mg/dL   VLDL 87.3 0.0 - 59.9 mg/dL   LDL Cholesterol 875 (H) 0 - 99 mg/dL   Total CHOL/HDL Ratio 3    NonHDL 136.38   TSH   Collection Time: 04/02/24 10:17 AM  Result Value Ref Range   TSH 2.16 0.35 - 5.50 uIU/mL

## 2024-03-26 NOTE — Patient Instructions (Addendum)
 Great to see you again today!  I will be in touch with your labs We can check back in 6-12 months Let's do a pap next year and also update your bone density next year ?water aerobics

## 2024-04-02 ENCOUNTER — Encounter: Payer: Self-pay | Admitting: Family Medicine

## 2024-04-02 ENCOUNTER — Ambulatory Visit (INDEPENDENT_AMBULATORY_CARE_PROVIDER_SITE_OTHER): Admitting: Family Medicine

## 2024-04-02 VITALS — BP 110/70 | HR 58 | Ht 61.0 in | Wt 120.6 lb

## 2024-04-02 DIAGNOSIS — R7303 Prediabetes: Secondary | ICD-10-CM | POA: Diagnosis not present

## 2024-04-02 DIAGNOSIS — Z0000 Encounter for general adult medical examination without abnormal findings: Secondary | ICD-10-CM

## 2024-04-02 DIAGNOSIS — Z13220 Encounter for screening for lipoid disorders: Secondary | ICD-10-CM | POA: Diagnosis not present

## 2024-04-02 DIAGNOSIS — M810 Age-related osteoporosis without current pathological fracture: Secondary | ICD-10-CM | POA: Diagnosis not present

## 2024-04-02 DIAGNOSIS — Z1329 Encounter for screening for other suspected endocrine disorder: Secondary | ICD-10-CM | POA: Diagnosis not present

## 2024-04-02 DIAGNOSIS — E039 Hypothyroidism, unspecified: Secondary | ICD-10-CM | POA: Diagnosis not present

## 2024-04-02 LAB — LIPID PANEL
Cholesterol: 193 mg/dL (ref 0–200)
HDL: 56.3 mg/dL (ref >39.00)
LDL Cholesterol: 124 mg/dL — ABNORMAL HIGH (ref 0–99)
NonHDL: 136.38
Total CHOL/HDL Ratio: 3
Triglycerides: 63 mg/dL (ref 0.0–149.0)
VLDL: 12.6 mg/dL (ref 0.0–40.0)

## 2024-04-02 LAB — TSH: TSH: 2.16 u[IU]/mL (ref 0.35–5.50)

## 2024-04-05 ENCOUNTER — Telehealth: Payer: Self-pay

## 2024-04-05 NOTE — Telephone Encounter (Signed)
 Diatherix kit left at the front desk 2 nd floor.  Left message on machine to call back

## 2024-04-05 NOTE — Telephone Encounter (Signed)
-----   Message from Nurse Mozetta Murfin P sent at 03/08/2024  9:48 AM EDT ----- Pt needs diatherix

## 2024-04-06 NOTE — Telephone Encounter (Signed)
 Patient was advised.

## 2024-04-23 ENCOUNTER — Other Ambulatory Visit (HOSPITAL_BASED_OUTPATIENT_CLINIC_OR_DEPARTMENT_OTHER): Payer: Self-pay

## 2024-04-23 ENCOUNTER — Encounter: Payer: Self-pay | Admitting: Family Medicine

## 2024-04-23 MED ORDER — LEVOTHYROXINE SODIUM 50 MCG PO TABS
50.0000 ug | ORAL_TABLET | Freq: Every day | ORAL | 1 refills | Status: DC
  Filled 2024-04-23: qty 90, 90d supply, fill #0
  Filled 2024-07-19: qty 90, 90d supply, fill #1

## 2024-04-25 ENCOUNTER — Encounter: Payer: Self-pay | Admitting: Internal Medicine

## 2024-04-25 DIAGNOSIS — B9681 Helicobacter pylori [H. pylori] as the cause of diseases classified elsewhere: Secondary | ICD-10-CM | POA: Diagnosis not present

## 2024-04-26 DIAGNOSIS — G8929 Other chronic pain: Secondary | ICD-10-CM | POA: Diagnosis not present

## 2024-04-26 DIAGNOSIS — M25511 Pain in right shoulder: Secondary | ICD-10-CM | POA: Diagnosis not present

## 2024-05-07 ENCOUNTER — Other Ambulatory Visit (HOSPITAL_BASED_OUTPATIENT_CLINIC_OR_DEPARTMENT_OTHER): Payer: Self-pay

## 2024-05-07 MED ORDER — ALPRAZOLAM 0.5 MG PO TABS
0.5000 mg | ORAL_TABLET | Freq: Three times a day (TID) | ORAL | 1 refills | Status: DC | PRN
  Filled 2024-05-07: qty 60, 20d supply, fill #0

## 2024-05-09 ENCOUNTER — Encounter: Payer: Self-pay | Admitting: Internal Medicine

## 2024-05-14 DIAGNOSIS — R5383 Other fatigue: Secondary | ICD-10-CM | POA: Diagnosis not present

## 2024-05-14 DIAGNOSIS — K759 Inflammatory liver disease, unspecified: Secondary | ICD-10-CM | POA: Diagnosis not present

## 2024-05-14 DIAGNOSIS — Q782 Osteopetrosis: Secondary | ICD-10-CM | POA: Diagnosis not present

## 2024-05-14 DIAGNOSIS — E559 Vitamin D deficiency, unspecified: Secondary | ICD-10-CM | POA: Diagnosis not present

## 2024-05-14 DIAGNOSIS — R635 Abnormal weight gain: Secondary | ICD-10-CM | POA: Diagnosis not present

## 2024-05-14 DIAGNOSIS — E063 Autoimmune thyroiditis: Secondary | ICD-10-CM | POA: Diagnosis not present

## 2024-05-14 DIAGNOSIS — N951 Menopausal and female climacteric states: Secondary | ICD-10-CM | POA: Diagnosis not present

## 2024-05-17 DIAGNOSIS — L814 Other melanin hyperpigmentation: Secondary | ICD-10-CM | POA: Diagnosis not present

## 2024-05-17 DIAGNOSIS — L578 Other skin changes due to chronic exposure to nonionizing radiation: Secondary | ICD-10-CM | POA: Diagnosis not present

## 2024-05-17 DIAGNOSIS — L821 Other seborrheic keratosis: Secondary | ICD-10-CM | POA: Diagnosis not present

## 2024-06-15 ENCOUNTER — Other Ambulatory Visit (HOSPITAL_BASED_OUTPATIENT_CLINIC_OR_DEPARTMENT_OTHER): Payer: Self-pay

## 2024-06-15 ENCOUNTER — Telehealth: Admitting: Family Medicine

## 2024-06-15 DIAGNOSIS — J40 Bronchitis, not specified as acute or chronic: Secondary | ICD-10-CM

## 2024-06-15 MED ORDER — PREDNISONE 20 MG PO TABS
20.0000 mg | ORAL_TABLET | Freq: Two times a day (BID) | ORAL | 0 refills | Status: AC
  Filled 2024-06-15: qty 10, 5d supply, fill #0

## 2024-06-15 MED ORDER — BENZONATATE 200 MG PO CAPS
200.0000 mg | ORAL_CAPSULE | Freq: Two times a day (BID) | ORAL | 0 refills | Status: AC | PRN
  Filled 2024-06-15: qty 20, 10d supply, fill #0

## 2024-06-15 NOTE — Progress Notes (Signed)
 E-Visit for Cough   We are sorry that you are not feeling well.  Here is how we plan to help!  Based on your presentation I believe you most likely have A cough due to a virus.  This is called viral bronchitis and is best treated by rest, plenty of fluids and control of the cough.  You may use Ibuprofen or Tylenol  as directed to help your symptoms.     In addition you may use A prescription cough medication called Tessalon  Perles 100mg . You may take 1-2 capsules every 8 hours as needed for your cough.  I have sent prednisone .  From your responses in the eVisit questionnaire you describe inflammation in the upper respiratory tract which is causing a significant cough.  This is commonly called Bronchitis and has four common causes:   Allergies Viral Infections Acid Reflux Bacterial Infection Allergies, viruses and acid reflux are treated by controlling symptoms or eliminating the cause. An example might be a cough caused by taking certain blood pressure medications. You stop the cough by changing the medication. Another example might be a cough caused by acid reflux. Controlling the reflux helps control the cough.  USE OF BRONCHODILATOR (RESCUE) INHALERS: There is a risk from using your bronchodilator too frequently.  The risk is that over-reliance on a medication which only relaxes the muscles surrounding the breathing tubes can reduce the effectiveness of medications prescribed to reduce swelling and congestion of the tubes themselves.  Although you feel brief relief from the bronchodilator inhaler, your asthma may actually be worsening with the tubes becoming more swollen and filled with mucus.  This can delay other crucial treatments, such as oral steroid medications. If you need to use a bronchodilator inhaler daily, several times per day, you should discuss this with your provider.  There are probably better treatments that could be used to keep your asthma under control.     HOME  CARE Only take medications as instructed by your medical team. Complete the entire course of an antibiotic. Drink plenty of fluids and get plenty of rest. Avoid close contacts especially the very young and the elderly Cover your mouth if you cough or cough into your sleeve. Always remember to wash your hands A steam or ultrasonic humidifier can help congestion.   GET HELP RIGHT AWAY IF: You develop worsening fever. You become short of breath You cough up blood. Your symptoms persist after you have completed your treatment plan MAKE SURE YOU  Understand these instructions. Will watch your condition. Will get help right away if you are not doing well or get worse.    Thank you for choosing an e-visit.  Your e-visit answers were reviewed by a board certified advanced clinical practitioner to complete your personal care plan. Depending upon the condition, your plan could have included both over the counter or prescription medications.  Please review your pharmacy choice. Make sure the pharmacy is open so you can pick up prescription now. If there is a problem, you may contact your provider through [employer] and have the prescription routed to another pharmacy.  Your safety is important to us . If you have drug allergies check your prescription carefully.   For the next 24 hours you can use MyChart to ask questions about today's visit, request a non-urgent call back, or ask for a work or school excuse. You will get an email in the next two days asking about your experience. I hope that your e-visit has been valuable and  will speed your recovery.    have provided 5 minutes of non face to face time during this encounter for chart review and documentation.

## 2024-06-20 ENCOUNTER — Telehealth: Admitting: Physician Assistant

## 2024-06-20 DIAGNOSIS — B999 Unspecified infectious disease: Secondary | ICD-10-CM

## 2024-06-20 NOTE — Progress Notes (Signed)
  Because of persistent symptoms despite prior treatment via e-visit, I feel your condition warrants further evaluation and I recommend that you be seen in a face-to-face visit.   NOTE: There will be NO CHARGE for this E-Visit   If you are having a true medical emergency, please call 911.     For an urgent face to face visit, [facility] has multiple urgent care centers for your convenience.  Click the link below for the full list of locations and hours, walk-in wait times, appointment scheduling options and driving directions:  Urgent Care - [place], [place], [place], [place], [place], [place]  [place]     Your MyChart E-visit questionnaire answers were reviewed by a board certified advanced clinical practitioner to complete your personal care plan based on your specific symptoms.    Thank you for using e-Visits.

## 2024-06-21 ENCOUNTER — Ambulatory Visit (INDEPENDENT_AMBULATORY_CARE_PROVIDER_SITE_OTHER): Admitting: Family

## 2024-06-21 ENCOUNTER — Other Ambulatory Visit (HOSPITAL_BASED_OUTPATIENT_CLINIC_OR_DEPARTMENT_OTHER): Payer: Self-pay

## 2024-06-21 ENCOUNTER — Encounter: Payer: Self-pay | Admitting: Family

## 2024-06-21 VITALS — BP 116/64 | HR 60 | Temp 98.2°F | Ht 61.0 in | Wt 120.2 lb

## 2024-06-21 DIAGNOSIS — J209 Acute bronchitis, unspecified: Secondary | ICD-10-CM

## 2024-06-21 MED ORDER — AZITHROMYCIN 250 MG PO TABS
ORAL_TABLET | ORAL | 0 refills | Status: AC
  Filled 2024-06-21: qty 6, 5d supply, fill #0

## 2024-06-21 NOTE — Progress Notes (Signed)
 Melanie Taylor is a 58 y.o. female with the following history as recorded in EpicCare:  Patient Active Problem List   Diagnosis Date Noted   Helicobacter pylori gastritis 03/05/2024   Early satiety 02/20/2024   Nausea and vomiting 02/20/2024   Osteoporosis 06/06/2023   Dyspepsia 07/13/2022   Prediabetes 05/09/2022   IBS (irritable bowel syndrome) 01/02/2016   Hyperthyroidism 05/24/2011   Anxiety 05/24/2011   GERD 04/10/2008    Current Outpatient Medications  Medication Sig Dispense Refill   ALPRAZolam  (XANAX ) 0.5 MG tablet TAKE 1 TABLET BY MOUTH THREE TIMES A DAY AS NEEDED FOR ANXIETY OR SLEEP 60 tablet 1   ALPRAZolam  (XANAX ) 0.5 MG tablet Take 1 tablet (0.5 mg total) by mouth 3 (three) times daily as needed for anxiety or sleep 60 tablet 1   Ascorbic Acid (VITAMIN C) 1000 MG tablet Take 1,000 mg by mouth daily.     azithromycin  (ZITHROMAX ) 250 MG tablet Take 2 tablets (500 mg total) by mouth on day 1, and then take 1 tablet (250 mg total) daily on days 2 though 5. 6 tablet 0   benzonatate  (TESSALON ) 200 MG capsule Take 1 capsule (200 mg total) by mouth 2 (two) times daily as needed for cough. 20 capsule 0   celecoxib (CELEBREX) 100 MG capsule Take 100 mg by mouth as needed.     estradiol  (ESTRACE ) 0.1 MG/GM vaginal cream Place 1 Applicatorful vaginally at bedtime.     lansoprazole (PREVACID) 15 MG capsule Take 15 mg by mouth daily at 12 noon.     levothyroxine  (SYNTHROID ) 50 MCG tablet Take 1 tablet (50 mcg total) by mouth daily before breakfast. 90 tablet 1   Multiple Vitamin (MULTIVITAMIN) capsule Take 1 capsule by mouth daily.     Multiple Vitamins-Minerals (ALGAE BASED CALCIUM PO) Take 2 capsules by mouth 2 (two) times daily.     Omega-3 Fatty Acids (OMEGA 3 PO) Take 1 capsule by mouth daily.     progesterone  (PROMETRIUM ) 100 MG capsule Take 200 mg by mouth at bedtime.     valACYclovir  (VALTREX ) 500 MG tablet TAKE 4 TABLETS (2GM) ONCE, REPEAT IN 12 HOURS AS NEEDED FOR COLD SORE 30  tablet 3   Vitamin D -Vitamin K (VITAMIN K2-VITAMIN D3 PO) Take 1 capsule by mouth as needed.     loratadine (CLARITIN) 10 MG tablet Take 10 mg by mouth daily. (Patient not taking: Reported on 06/21/2024)     No current facility-administered medications for this visit.    Allergies: Patient has no known allergies.  Past Medical History:  Diagnosis Date   Allergy    Arthritis    GERD (gastroesophageal reflux disease)    IBS (irritable bowel syndrome)    Osteoporosis    Thyroid  disease    hyperthyroidism    Past Surgical History:  Procedure Laterality Date   AUGMENTATION MAMMAPLASTY  11/2004   CESAREAN SECTION  06/2003   one time   COLONOSCOPY     over 10 years ago with Dr Luis   COLONOSCOPY WITH PROPOFOL   2020   [facility]   KNEE SURGERY  2003   left   SHOULDER SURGERY  2010   right shoulder   SHOULDER SURGERY Left 2021   UPPER GASTROINTESTINAL ENDOSCOPY      Family History  Problem Relation Age of Onset   Alcohol abuse Father    Heart attack Father    Sudden death Father    Diabetes Father    Heart disease Father    Arthritis  Mother    Hypothyroidism Mother    Hypothyroidism Maternal Grandmother    Cancer Maternal Grandfather        COLON   Colon cancer Maternal Grandfather    Diabetes Other        GRANDFATHER   Esophageal cancer Neg Hx    Rectal cancer Neg Hx    Stomach cancer Neg Hx     Social History   Tobacco Use   Smoking status: Never   Smokeless tobacco: Never  Substance Use Topics   Alcohol use: Yes    Comment: socially    Subjective:   Had e-visit last week and diagnosed with bronchitis; improved on prednisone  but did not resolve completely; concerned about recurrent symptoms; no fever, night sweats, chest pain or shortness of breath; is taking OTC Claritin and Flonase ;   Objective:  Vitals:   06/21/24 1110  BP: 116/64  Pulse: 60  Temp: 98.2 F (36.8 C)  TempSrc: Oral  SpO2: 99%  Weight: 120 lb 3.2 oz (54.5 kg)  Height: 5' 1 (1.549 m)     General: Well developed, well nourished, in no acute distress  Skin : Warm and dry.  Head: Normocephalic and atraumatic  Eyes: Sclera and conjunctiva clear; pupils round and reactive to light; extraocular movements intact  Ears: External normal; canals clear; tympanic membranes normal  Oropharynx: Pink, supple. No suspicious lesions  Neck: Supple without thyromegaly, adenopathy  Lungs: Respirations unlabored; clear to auscultation bilaterally without wheeze, rales, rhonchi  CVS exam: normal rate and regular rhythm.  Neurologic: Alert and oriented; speech intact; face symmetrical; moves all extremities well; CNII-XII intact without focal deficit   Assessment:  1. Acute bronchitis, unspecified organism     Plan:  Rx for Z-pak #1 take as directed; continue Flonase ; increase fluids, rest and follow up worse, no better.   No follow-ups on file.  No orders of the defined types were placed in this encounter.   Requested Prescriptions   Signed Prescriptions Disp Refills   azithromycin  (ZITHROMAX ) 250 MG tablet 6 tablet 0    Sig: Take 2 tablets (500 mg total) by mouth on day 1, and then take 1 tablet (250 mg total) daily on days 2 though 5.

## 2024-07-19 ENCOUNTER — Other Ambulatory Visit: Payer: Self-pay

## 2024-07-19 ENCOUNTER — Other Ambulatory Visit (HOSPITAL_BASED_OUTPATIENT_CLINIC_OR_DEPARTMENT_OTHER): Payer: Self-pay

## 2024-07-19 ENCOUNTER — Other Ambulatory Visit: Payer: Self-pay | Admitting: Family Medicine

## 2024-07-19 MED ORDER — ALPRAZOLAM 0.5 MG PO TABS
0.5000 mg | ORAL_TABLET | Freq: Three times a day (TID) | ORAL | 1 refills | Status: AC | PRN
  Filled 2024-07-19: qty 60, 20d supply, fill #0
  Filled 2024-10-18: qty 60, 20d supply, fill #1

## 2024-07-23 DIAGNOSIS — G8929 Other chronic pain: Secondary | ICD-10-CM | POA: Diagnosis not present

## 2024-07-23 DIAGNOSIS — M25511 Pain in right shoulder: Secondary | ICD-10-CM | POA: Diagnosis not present

## 2024-08-07 DIAGNOSIS — M25511 Pain in right shoulder: Secondary | ICD-10-CM | POA: Diagnosis not present

## 2024-08-13 DIAGNOSIS — M25511 Pain in right shoulder: Secondary | ICD-10-CM | POA: Diagnosis not present

## 2024-08-16 DIAGNOSIS — N951 Menopausal and female climacteric states: Secondary | ICD-10-CM | POA: Diagnosis not present

## 2024-08-16 DIAGNOSIS — E039 Hypothyroidism, unspecified: Secondary | ICD-10-CM | POA: Diagnosis not present

## 2024-08-16 DIAGNOSIS — Z8249 Family history of ischemic heart disease and other diseases of the circulatory system: Secondary | ICD-10-CM | POA: Diagnosis not present

## 2024-08-16 DIAGNOSIS — M357 Hypermobility syndrome: Secondary | ICD-10-CM | POA: Diagnosis not present

## 2024-08-16 DIAGNOSIS — R799 Abnormal finding of blood chemistry, unspecified: Secondary | ICD-10-CM | POA: Diagnosis not present

## 2024-08-16 DIAGNOSIS — I7090 Unspecified atherosclerosis: Secondary | ICD-10-CM | POA: Diagnosis not present

## 2024-08-16 DIAGNOSIS — D6489 Other specified anemias: Secondary | ICD-10-CM | POA: Diagnosis not present

## 2024-08-16 DIAGNOSIS — E782 Mixed hyperlipidemia: Secondary | ICD-10-CM | POA: Diagnosis not present

## 2024-09-10 ENCOUNTER — Other Ambulatory Visit (HOSPITAL_BASED_OUTPATIENT_CLINIC_OR_DEPARTMENT_OTHER): Payer: Self-pay

## 2024-09-10 MED ORDER — PROGESTERONE MICRONIZED 100 MG PO CAPS
200.0000 mg | ORAL_CAPSULE | Freq: Every day | ORAL | 3 refills | Status: AC
  Filled 2024-09-10: qty 180, 90d supply, fill #0

## 2024-09-18 ENCOUNTER — Other Ambulatory Visit (HOSPITAL_BASED_OUTPATIENT_CLINIC_OR_DEPARTMENT_OTHER): Payer: Self-pay

## 2024-09-19 ENCOUNTER — Other Ambulatory Visit (HOSPITAL_BASED_OUTPATIENT_CLINIC_OR_DEPARTMENT_OTHER): Payer: Self-pay

## 2024-09-21 ENCOUNTER — Other Ambulatory Visit: Payer: Self-pay | Admitting: Family Medicine

## 2024-09-21 ENCOUNTER — Other Ambulatory Visit (HOSPITAL_BASED_OUTPATIENT_CLINIC_OR_DEPARTMENT_OTHER): Payer: Self-pay

## 2024-09-21 MED ORDER — LEVOTHYROXINE SODIUM 50 MCG PO TABS
50.0000 ug | ORAL_TABLET | Freq: Every day | ORAL | 1 refills | Status: AC
  Filled 2024-09-21 – 2024-10-18 (×2): qty 90, 90d supply, fill #0

## 2024-10-18 ENCOUNTER — Other Ambulatory Visit (HOSPITAL_BASED_OUTPATIENT_CLINIC_OR_DEPARTMENT_OTHER): Payer: Self-pay

## 2024-10-18 ENCOUNTER — Other Ambulatory Visit: Payer: Self-pay
